# Patient Record
Sex: Female | Born: 1980 | Race: Black or African American | Hispanic: No | Marital: Single | Smoking: Never smoker
Health system: Southern US, Community
[De-identification: ages and names within clinical notes are randomized; demographics above are authoritative.]

## PROBLEM LIST (undated history)

## (undated) DIAGNOSIS — R05 Cough: Secondary | ICD-10-CM

## (undated) DIAGNOSIS — R011 Cardiac murmur, unspecified: Secondary | ICD-10-CM

## (undated) DIAGNOSIS — I2699 Other pulmonary embolism without acute cor pulmonale: Secondary | ICD-10-CM

## (undated) DIAGNOSIS — D649 Anemia, unspecified: Secondary | ICD-10-CM

## (undated) DIAGNOSIS — L309 Dermatitis, unspecified: Secondary | ICD-10-CM

## (undated) DIAGNOSIS — M1990 Unspecified osteoarthritis, unspecified site: Secondary | ICD-10-CM

## (undated) DIAGNOSIS — I10 Essential (primary) hypertension: Secondary | ICD-10-CM

## (undated) DIAGNOSIS — I739 Peripheral vascular disease, unspecified: Secondary | ICD-10-CM

## (undated) DIAGNOSIS — F419 Anxiety disorder, unspecified: Secondary | ICD-10-CM

## (undated) DIAGNOSIS — K219 Gastro-esophageal reflux disease without esophagitis: Secondary | ICD-10-CM

## (undated) DIAGNOSIS — J189 Pneumonia, unspecified organism: Secondary | ICD-10-CM

## (undated) DIAGNOSIS — O039 Complete or unspecified spontaneous abortion without complication: Secondary | ICD-10-CM

## (undated) DIAGNOSIS — I82409 Acute embolism and thrombosis of unspecified deep veins of unspecified lower extremity: Secondary | ICD-10-CM

## (undated) DIAGNOSIS — J45909 Unspecified asthma, uncomplicated: Secondary | ICD-10-CM

## (undated) HISTORY — DX: Cough: R05

## (undated) HISTORY — PX: MYOMECTOMY: SHX85

## (undated) HISTORY — DX: Dermatitis, unspecified: L30.9

## (undated) HISTORY — DX: Pneumonia, unspecified organism: J18.9

## (undated) HISTORY — DX: Other pulmonary embolism without acute cor pulmonale: I26.99

## (undated) HISTORY — DX: Acute embolism and thrombosis of unspecified deep veins of unspecified lower extremity: I82.409

## (undated) HISTORY — DX: Complete or unspecified spontaneous abortion without complication: O03.9

## (undated) HISTORY — DX: Essential (primary) hypertension: I10

---

## 2004-02-24 ENCOUNTER — Other Ambulatory Visit: Admission: RE | Admit: 2004-02-24 | Discharge: 2004-02-24 | Payer: Self-pay | Admitting: Obstetrics and Gynecology

## 2011-06-27 DIAGNOSIS — O039 Complete or unspecified spontaneous abortion without complication: Secondary | ICD-10-CM

## 2011-06-27 HISTORY — DX: Complete or unspecified spontaneous abortion without complication: O03.9

## 2012-06-26 DIAGNOSIS — I739 Peripheral vascular disease, unspecified: Secondary | ICD-10-CM

## 2012-06-26 HISTORY — DX: Peripheral vascular disease, unspecified: I73.9

## 2012-06-26 HISTORY — PX: IVC FILTER PLACEMENT (ARMC HX): HXRAD1551

## 2015-11-02 ENCOUNTER — Other Ambulatory Visit: Payer: Self-pay | Admitting: Gastroenterology

## 2015-11-02 DIAGNOSIS — R110 Nausea: Secondary | ICD-10-CM

## 2015-11-02 DIAGNOSIS — R1084 Generalized abdominal pain: Secondary | ICD-10-CM

## 2015-11-15 ENCOUNTER — Encounter (HOSPITAL_COMMUNITY)
Admission: RE | Admit: 2015-11-15 | Discharge: 2015-11-15 | Disposition: A | Discharge status: Home / Self Care | Payer: BLUE CROSS/BLUE SHIELD | Source: Ambulatory Visit | Attending: Gastroenterology | Admitting: Gastroenterology

## 2015-11-15 ENCOUNTER — Ambulatory Visit (HOSPITAL_COMMUNITY)
Admission: RE | Admit: 2015-11-15 | Discharge: 2015-11-15 | Disposition: A | Discharge status: Home / Self Care | Payer: BLUE CROSS/BLUE SHIELD | Source: Ambulatory Visit | Attending: Gastroenterology | Admitting: Gastroenterology

## 2015-11-15 DIAGNOSIS — R109 Unspecified abdominal pain: Secondary | ICD-10-CM | POA: Insufficient documentation

## 2015-11-15 DIAGNOSIS — R110 Nausea: Secondary | ICD-10-CM | POA: Insufficient documentation

## 2015-11-15 DIAGNOSIS — R1084 Generalized abdominal pain: Secondary | ICD-10-CM

## 2015-11-15 MED ORDER — TECHNETIUM TC 99M MEBROFENIN IV KIT
5.1300 | PACK | Freq: Once | INTRAVENOUS | Status: AC | PRN
  Administered 2015-11-15: 5.13 via INTRAVENOUS

## 2015-12-30 ENCOUNTER — Ambulatory Visit (INDEPENDENT_AMBULATORY_CARE_PROVIDER_SITE_OTHER): Payer: BLUE CROSS/BLUE SHIELD | Admitting: Physician Assistant

## 2015-12-30 VITALS — BP 151/101 | HR 83 | Temp 98.2°F | Resp 18 | Ht 68.0 in | Wt 271.0 lb

## 2015-12-30 DIAGNOSIS — I10 Essential (primary) hypertension: Secondary | ICD-10-CM | POA: Diagnosis not present

## 2015-12-30 DIAGNOSIS — D649 Anemia, unspecified: Secondary | ICD-10-CM | POA: Insufficient documentation

## 2015-12-30 DIAGNOSIS — Z862 Personal history of diseases of the blood and blood-forming organs and certain disorders involving the immune mechanism: Secondary | ICD-10-CM | POA: Diagnosis not present

## 2015-12-30 LAB — POCT URINALYSIS DIP (MANUAL ENTRY)
BILIRUBIN UA: NEGATIVE
Glucose, UA: NEGATIVE
Ketones, POC UA: NEGATIVE
Leukocytes, UA: NEGATIVE
NITRITE UA: NEGATIVE
PH UA: 5.5
PROTEIN UA: NEGATIVE
RBC UA: NEGATIVE
Spec Grav, UA: >=1.03
UROBILINOGEN UA: 0.2

## 2015-12-30 LAB — CBC
HEMATOCRIT: 36.6 % (ref 35.0–45.0)
HEMOGLOBIN: 11.8 g/dL (ref 11.7–15.5)
MCH: 28.4 pg (ref 27.0–33.0)
MCHC: 32.2 g/dL (ref 32.0–36.0)
MCV: 88 fL (ref 80.0–100.0)
MPV: 11.2 fL (ref 7.5–12.5)
Platelets: 268 10*3/uL (ref 140–400)
RBC: 4.16 MIL/uL (ref 3.80–5.10)
RDW: 14.7 % (ref 11.0–15.0)
WBC: 6.7 10*3/uL (ref 3.8–10.8)

## 2015-12-30 MED ORDER — AMLODIPINE BESYLATE 10 MG PO TABS: 10.0000 mg | ORAL_TABLET | Freq: Every day | ORAL | Status: DC

## 2015-12-30 MED ORDER — BLOOD PRESSURE CUFF MISC: 1.0000 | Freq: Every day | Status: DC

## 2015-12-30 MED ORDER — CHLORTHALIDONE 25 MG PO TABS: 25.0000 mg | ORAL_TABLET | Freq: Every day | ORAL | Status: DC

## 2015-12-30 NOTE — Progress Notes (Signed)
MRN: [id number] DOB: 1980-12-12  Subjective:   Sheri Robinson is a 35 y.o. female presenting for follow up on Hypertension.   Was diagnosed with HTN in 2013 in [place] during pregnancy. Was on metoprolol succ 50mg . Pt reports miscarriage at 5 months and continuation of HTN management with metoprolol. Was controlled on medication until June 2016. Upon relocating to this area, pt sought out a new PCP in order to get refills of her meds. The PCP she was seeing in [facility] did not believe in prescribing medications for HTN so she did not refill them. Instead, she encouraged lifestyle modifications (i.e., losing weight, meditation, referred for evaulation of TSH, OSA). Pt states that she was checked in 2013 for OSA and thyroid issues and reports that she did not have either. Pt tried lifestyle modifications for a couple of months but was having worse headaches, nose bleeds, and episodic dizziness. Each time she would see a specialist they would tell her that her BP was elevated. Tried to get a new PCP but everywhere has been booked up until now.   Currently managed with no meds. Patient is not checking blood pressure at home, range is [id number] systolic and [id number] diastolic when she sees her speicialists. Reports lightheadedness, dizziness, chronic headache, nausea, abdominal pain (but pt has GI issues always), blurry vision at times without glasses.  Denies chest pain, shortness of breath, heart racing, palpitations,  hematuria, lower leg swelling. No smoking, No alcohol. Denies any other aggravating or relieving factors, no other questions or concerns.  Exercising 1-2 times per week at a mild intensity (walks up stairs to apartment, crunches, and lunges) Diet consists [place] based meats, vegetables, minimal fruits, and water.   Pt is aware that she needs to lose weight and is interested in participating in more exercise once her BP is under control.  Pt is not actively trying to get pregnant but is  also not preventing it.    [name] has a current medication list which includes the following prescription(s): metoprolol. Also is allergic to amoxicillin and penicillins.  Sheri Robinson  has a past medical history of Hypertension; DVT (deep venous thrombosis) ([place]); and Miscarriage. Also  has past surgical history that includes Myomectomy and IVC FILTER PLACEMENT ([facility] HX).  Objective:   Vitals: BP 151/101 mmHg  Pulse 83  Temp(Src) 98.2 F (36.8 C) (Oral)  Resp 18  Ht 5\' 8"  (1.727 m)  Wt 271 lb (122.925 kg)  BMI 41.22 kg/m2  SpO2 99%  LMP 12/03/2015  Physical Exam  Constitutional: She is oriented to person, place, and time. She appears well-developed and well-nourished.  HENT:  Head: Normocephalic and atraumatic.  Eyes: Conjunctivae are normal. Pupils are equal, round, and reactive to light. No scleral icterus.  Neck: Normal range of motion.  Cardiovascular: Normal rate, regular rhythm and normal heart sounds.   Pulmonary/Chest: Effort normal and breath sounds normal.  Abdominal: Soft. Bowel sounds are normal.  Neurological: She is alert and oriented to person, place, and time.  Skin: Skin is warm and dry.  Psychiatric: She has a normal mood and affect.  Vitals reviewed.   Assessment and Plan :  1. Essential hypertension -Pt instructed to check  BP at home after resting every couple of days for the next two weeks. Prescribed a blood pressure cuff, [facility] for BP cuff for them to correctly fit your arm. Start Norvasc first for four days then add the chlorthalidone. If pt starts to feel light headed or  blood pressure reading of <100/60, can cut the chlorthalidone in half.  If pt becomes pregnant, informed that gynecologist may change medication during pregnancy.  - CBC - COMPLETE METABOLIC PANEL WITH GFR - TSH - Lipid panel - POCT urinalysis dipstick - EKG 12-Lead showed NSR  - amLODipine (NORVASC) 10 MG tablet; Take 1 tablet (10 mg total) by mouth daily.  Dispense:  30 tablet; Refill: 0 - chlorthalidone (HYGROTON) 25 MG tablet; Take 1 tablet (25 mg total) by mouth daily.  Dispense: 30 tablet; Refill: 0 -Follow up with me in office in 2 weeks.  -Pt encouraged to continue lifestyle modifications and was given instructions on heart healthy diet  2. History of anemia - CBC   [name], PA-C  [facility] 12/30/2015 11:15 AM

## 2015-12-30 NOTE — Patient Instructions (Addendum)
[facility] for BP cuff for them to correctly fit your arm Address: [address] [place], [place] [address]  Phone: [phone]    Check your BP at home after resting every couple of days for the next two weeks. I will see you back in office in two weeks for reevaluation. Start taking blood pressure medications daily. Start Norvasc first for four days then add the chlorthalidone. If you start to feel light headed or your blood pressure reading of <100/60 you can cut the chlorthalidone in half.   If you become pregnant, your gynecologist may change your medication during pregnancy. If you have any questions please let us know.     Heart-Healthy Eating Plan Many factors influence your heart health, including eating and exercise habits. Heart (coronary) risk increases with abnormal blood fat (lipid) levels. Heart-healthy meal planning includes limiting unhealthy fats, increasing healthy fats, and making other small dietary changes. This includes maintaining a healthy body weight to help keep lipid levels within a normal range. WHAT IS MY PLAN?  Your health care provider recommends that you:  Get no more than _________% of the total calories in your daily diet from fat.  Limit your intake of saturated fat to less than _________% of your total calories each day.  Limit the amount of cholesterol in your diet to less than _________ mg per day. WHAT TYPES OF FAT SHOULD I CHOOSE?  Choose healthy fats more often. Choose monounsaturated and polyunsaturated fats, such as olive oil and canola oil, flaxseeds, walnuts, almonds, and seeds.  Eat more omega-3 fats. Good choices include salmon, mackerel, sardines, tuna, flaxseed oil, and ground flaxseeds. Aim to eat fish at least two times each week.  Limit saturated fats. Saturated fats are primarily found in animal products, such as meats, butter, and cream. Plant sources of saturated fats include palm oil, palm kernel oil, and coconut  oil.  Avoid foods with partially hydrogenated oils in them. These contain trans fats. Examples of foods that contain trans fats are stick margarine, some tub margarines, cookies, crackers, and other baked goods. WHAT GENERAL GUIDELINES DO I NEED TO FOLLOW?  Check food labels carefully to identify foods with trans fats or high amounts of saturated fat.  Fill one half of your plate with vegetables and green salads. Eat 4-5 servings of vegetables per day. A serving of vegetables equals 1 cup of raw leafy vegetables,  cup of raw or cooked cut-up vegetables, or  cup of vegetable juice.  Fill one fourth of your plate with whole grains. Look for the word "whole" as the first word in the ingredient list.  Fill one fourth of your plate with lean protein foods.  Eat 4-5 servings of fruit per day. A serving of fruit equals one medium whole fruit,  cup of dried fruit,  cup of fresh, frozen, or canned fruit, or  cup of 100% fruit juice.  Eat more foods that contain soluble fiber. Examples of foods that contain this type of fiber are apples, broccoli, carrots, beans, peas, and barley. Aim to get 20-30 g of fiber per day.  Eat more home-cooked food and less restaurant, buffet, and fast food.  Limit or avoid alcohol.  Limit foods that are high in starch and sugar.  Avoid fried foods.  Cook foods by using methods other than frying. Baking, boiling, grilling, and broiling are all great options. Other fat-reducing suggestions include:  Removing the skin from poultry.  Removing all visible fats from meats.  Skimming  the fat off of stews, soups, and gravies before serving them.  Steaming vegetables in water or broth.  Lose weight if you are overweight. Losing just 5-10% of your initial body weight can help your overall health and prevent diseases such as diabetes and heart disease.  Increase your consumption of nuts, legumes, and seeds to 4-5 servings per week. One serving of dried beans or  legumes equals  cup after being cooked, one serving of nuts equals 1 ounces, and one serving of seeds equals  ounce or 1 tablespoon.  You may need to monitor your salt (sodium) intake, especially if you have high blood pressure. Talk with your health care provider or dietitian to get more information about reducing sodium. WHAT FOODS CAN I EAT? Grains Breads, including [place], white, pita, wheat, raisin, rye, oatmeal, and [place]. Tortillas that are neither fried nor made with lard or trans fat. Low-fat rolls, including hotdog and hamburger buns and English muffins. Biscuits. Muffins. Waffles. Pancakes. Light popcorn. Whole-grain cereals. Flatbread. Melba toast. Pretzels. Breadsticks. Rusks. Low-fat snacks and crackers, including oyster, saltine, matzo, graham, animal, and rye. Rice and pasta, including brown rice and those that are made with whole wheat. Vegetables All vegetables. Fruits All fruits, but limit coconut. Meats and Other Protein Sources Lean, well-trimmed beef, veal, pork, and lamb. Chicken and [place] without skin. All fish and shellfish. Wild duck, rabbit, pheasant, and venison. Egg whites or low-cholesterol egg substitutes. Dried beans, peas, lentils, and tofu.Seeds and most nuts. Dairy Low-fat or nonfat cheeses, including ricotta, string, and mozzarella. Skim or 1% milk that is liquid, powdered, or evaporated. Buttermilk that is made with low-fat milk. Nonfat or low-fat yogurt. Beverages Mineral water. Diet carbonated beverages. Sweets and Desserts Sherbets and fruit ices. Honey, jam, marmalade, jelly, and syrups. Meringues and gelatins. Pure sugar candy, such as hard candy, jelly beans, gumdrops, mints, marshmallows, and small amounts of dark chocolate. [employer] Eat all sweets and desserts in moderation. Fats and Oils Nonhydrogenated (trans-free) margarines. Vegetable oils, including soybean, sesame, sunflower, olive, peanut, safflower, corn, canola, and cottonseed.  Salad dressings or mayonnaise that are made with a vegetable oil. Limit added fats and oils that you use for cooking, baking, salads, and as spreads. Other Cocoa powder. Coffee and tea. All seasonings and condiments. The items listed above may not be a complete list of recommended foods or beverages. Contact your dietitian for more options. WHAT FOODS ARE NOT RECOMMENDED? Grains Breads that are made with saturated or trans fats, oils, or whole milk. Croissants. Butter rolls. Cheese breads. Sweet rolls. Donuts. Buttered popcorn. Chow mein noodles. High-fat crackers, such as cheese or butter crackers. Meats and Other Protein Sources Fatty meats, such as hotdogs, short ribs, sausage, spareribs, bacon, ribeye roast or steak, and mutton. High-fat deli meats, such as salami and bologna. Caviar. Domestic duck and goose. Organ meats, such as kidney, liver, sweetbreads, brains, gizzard, chitterlings, and heart. Dairy Cream, sour cream, cream cheese, and creamed cottage cheese. Whole milk cheeses, including blue (bleu), Monterey Jack, [place], [place], American, [place], Swiss, [place], [place], and [place]. Whole or 2% milk that is liquid, evaporated, or condensed. Whole buttermilk. Cream sauce or high-fat cheese sauce. Yogurt that is made from whole milk. Beverages Regular sodas and drinks with added sugar. Sweets and Desserts Frosting. Pudding. Cookies. Cakes other than angel food cake. Candy that has milk chocolate or white chocolate, hydrogenated fat, butter, coconut, or unknown ingredients. Buttered syrups. Full-fat ice cream or ice cream drinks. Fats and Oils Gravy that  has suet, meat fat, or shortening. Cocoa butter, hydrogenated oils, palm oil, coconut oil, palm kernel oil. These can often be found in baked products, candy, fried foods, nondairy creamers, and whipped toppings. Solid fats and shortenings, including bacon fat, salt pork, lard, and butter. Nondairy cream substitutes, such as coffee  creamers and sour cream substitutes. Salad dressings that are made of unknown oils, cheese, or sour cream. The items listed above may not be a complete list of foods and beverages to avoid. Contact your dietitian for more information.   This information is not intended to replace advice given to you by your health care provider. Make sure you discuss any questions you have with your health care provider.   Document Released: 03/21/2008 Document Revised: 07/03/2014 Document Reviewed: 12/04/2013 Elsevier Interactive Patient Education 2016 [employer].   Hypertension Hypertension, commonly called high blood pressure, is when the force of blood pumping through your arteries is too strong. Your arteries are the blood vessels that carry blood from your heart throughout your body. A blood pressure reading consists of a higher number over a lower number, such as 110/72. The higher number (systolic) is the pressure inside your arteries when your heart pumps. The lower number (diastolic) is the pressure inside your arteries when your heart relaxes. Ideally you want your blood pressure below 120/80. Hypertension forces your heart to work harder to pump blood. Your arteries may become narrow or stiff. Having untreated or uncontrolled hypertension can cause heart attack, stroke, kidney disease, and other problems. RISK FACTORS Some risk factors for high blood pressure are controllable. Others are not.  Risk factors you cannot control include:   Race. You may be at higher risk if you are African American.  Age. Risk increases with age.  Gender. Men are at higher risk than women before age 52 years. After age 33, women are at higher risk than men. Risk factors you can control include:  Not getting enough exercise or physical activity.  Being overweight.  Getting too much fat, sugar, calories, or salt in your diet.  Drinking too much alcohol. SIGNS AND SYMPTOMS Hypertension does not usually cause  signs or symptoms. Extremely high blood pressure (hypertensive crisis) may cause headache, anxiety, shortness of breath, and nosebleed. DIAGNOSIS To check if you have hypertension, your health care provider will measure your blood pressure while you are seated, with your arm held at the level of your heart. It should be measured at least twice using the same arm. Certain conditions can cause a difference in blood pressure between your right and left arms. A blood pressure reading that is higher than normal on one occasion does not mean that you need treatment. If it is not clear whether you have high blood pressure, you may be asked to return on a different day to have your blood pressure checked again. Or, you may be asked to monitor your blood pressure at home for 1 or more weeks. TREATMENT Treating high blood pressure includes making lifestyle changes and possibly taking medicine. Living a healthy lifestyle can help lower high blood pressure. You may need to change some of your habits. Lifestyle changes may include:  Following the DASH diet. This diet is high in fruits, vegetables, and whole grains. It is low in salt, red meat, and added sugars.  Keep your sodium intake below 2,300 mg per day.  Getting at least 30-45 minutes of aerobic exercise at least 4 times per week.  Losing weight if necessary.  Not smoking.  Limiting alcoholic beverages.  Learning ways to reduce stress. Your health care provider may prescribe medicine if lifestyle changes are not enough to get your blood pressure under control, and if one of the following is true:  You are 38-11 years of age and your systolic blood pressure is above 140.  You are 75 years of age or older, and your systolic blood pressure is above 150.  Your diastolic blood pressure is above 90.  You have diabetes, and your systolic blood pressure is over [id number] or your diastolic blood pressure is over 90.  You have kidney disease and your blood  pressure is above 140/90.  You have heart disease and your blood pressure is above 140/90. Your personal target blood pressure may vary depending on your medical conditions, your age, and other factors. HOME CARE INSTRUCTIONS  Have your blood pressure rechecked as directed by your health care provider.   Take medicines only as directed by your health care provider. Follow the directions carefully. Blood pressure medicines must be taken as prescribed. The medicine does not work as well when you skip doses. Skipping doses also puts you at risk for problems.  Do not smoke.   Monitor your blood pressure at home as directed by your health care provider. SEEK MEDICAL CARE IF:   You think you are having a reaction to medicines taken.  You have recurrent headaches or feel dizzy.  You have swelling in your ankles.  You have trouble with your vision. SEEK IMMEDIATE MEDICAL CARE IF:  You develop a severe headache or confusion.  You have unusual weakness, numbness, or feel faint.  You have severe chest or abdominal pain.  You vomit repeatedly.  You have trouble breathing. MAKE SURE YOU:   Understand these instructions.  Will watch your condition.  Will get help right away if you are not doing well or get worse.   This information is not intended to replace advice given to you by your health care provider. Make sure you discuss any questions you have with your health care provider.   Document Released: 06/12/2005 Document Revised: 10/27/2014 Document Reviewed: 04/04/2013 Elsevier Interactive Patient Education 2016 [employer].     IF you received an x-ray today, you will receive an invoice from [facility]. Please contact [facility] at [phone] with questions or concerns regarding your invoice.   IF you received labwork today, you will receive an invoice from [employer]. Please contact [facility] at [phone] with questions  or concerns regarding your invoice.   Our billing staff will not be able to assist you with questions regarding bills from these companies.  You will be contacted with the lab results as soon as they are available. The fastest way to get your results is to activate your My Chart account. Instructions are located on the last page of this paperwork. If you have not heard from [place] regarding the results in 2 weeks, please contact this office.

## 2015-12-31 ENCOUNTER — Encounter: Payer: Self-pay | Admitting: Physician Assistant

## 2015-12-31 LAB — COMPLETE METABOLIC PANEL WITH GFR
ALBUMIN: 4 g/dL (ref 3.6–5.1)
ALK PHOS: 46 U/L (ref 33–115)
ALT: 10 U/L (ref 6–29)
AST: 13 U/L (ref 10–30)
BILIRUBIN TOTAL: 0.5 mg/dL (ref 0.2–1.2)
BUN: 9 mg/dL (ref 7–25)
CALCIUM: 8.9 mg/dL (ref 8.6–10.2)
CO2: 21 mmol/L (ref 20–31)
CREATININE: 0.75 mg/dL (ref 0.50–1.10)
Chloride: 105 mmol/L (ref 98–110)
GFR, Est African American: >89 mL/min (ref >=60)
GFR, Est Non African American: >89 mL/min (ref >=60)
Glucose, Bld: 95 mg/dL (ref 65–99)
Potassium: 4.2 mmol/L (ref 3.5–5.3)
Sodium: 140 mmol/L (ref 135–146)
TOTAL PROTEIN: 7.3 g/dL (ref 6.1–8.1)

## 2015-12-31 LAB — LIPID PANEL
Cholesterol: 177 mg/dL (ref 125–200)
HDL: 56 mg/dL (ref >=46)
LDL CALC: 100 mg/dL (ref <130)
Total CHOL/HDL Ratio: 3.2 Ratio (ref <=5.0)
Triglycerides: 103 mg/dL (ref <150)
VLDL: 21 mg/dL (ref <30)

## 2015-12-31 LAB — TSH: TSH: 1.61 m[IU]/L

## 2016-01-22 ENCOUNTER — Ambulatory Visit (INDEPENDENT_AMBULATORY_CARE_PROVIDER_SITE_OTHER): Payer: BLUE CROSS/BLUE SHIELD | Admitting: Physician Assistant

## 2016-01-22 VITALS — BP 126/84 | HR 97 | Temp 98.1°F | Resp 18 | Ht 68.0 in | Wt 264.6 lb

## 2016-01-22 DIAGNOSIS — I10 Essential (primary) hypertension: Secondary | ICD-10-CM

## 2016-01-22 DIAGNOSIS — J309 Allergic rhinitis, unspecified: Secondary | ICD-10-CM | POA: Diagnosis not present

## 2016-01-22 MED ORDER — AMLODIPINE BESYLATE 10 MG PO TABS: 10.0000 mg | ORAL_TABLET | Freq: Every day | ORAL | 3 refills | Status: DC

## 2016-01-22 MED ORDER — CETIRIZINE HCL 10 MG PO CAPS: 10.0000 mg | ORAL_CAPSULE | Freq: Every day | ORAL | 12 refills | Status: DC

## 2016-01-22 MED ORDER — TRIAMCINOLONE ACETONIDE 55 MCG/ACT NA AERO: 2.0000 | INHALATION_SPRAY | Freq: Every day | NASAL | 12 refills | Status: DC

## 2016-01-22 MED ORDER — CHLORTHALIDONE 25 MG PO TABS: 25.0000 mg | ORAL_TABLET | Freq: Every day | ORAL | 3 refills | Status: DC

## 2016-01-22 NOTE — Progress Notes (Signed)
MRN: [id number] DOB: 1980/11/08  Subjective:   Sheri Robinson is a 35 y.o. female presenting for follow up on Hypertension.   Currently managed with amlodipine and chlorithalidone. Patient is checking blood pressure at home, range is [id number] systolic.  Denies lightheadedness, dizziness, double vision, chest pain, shortness of breath, heart racing, palpitations, nausea, vomiting, abdominal pain, hematuria, lower leg swelling. No smoking, No alcohol. Denies any other aggravating or relieving factors.   Has lost 7 lbs in the past three weeks with diet (portion control). Eats mostly vegetables and fruits. Not exercising as much as she wants but did just invest in a treadmill.   One concern for pt today is fatigue. Notes that she assumed it was because of her uncontrolled high blood pressure but now that her bp is controlled she is wondering what else it could be due to. Notes that it began after moving here about one year ago. She feels tired throughout most of the day even though she reports sleeping about 8 hours a night. Pt works from home. Cannot think of anything that she has changed in the past year that would contribute to this. The only thing she notes that has changed since she moved here from the [place] is the severity of her allergies. Notes that they have been worse and has symptoms daily of runny nose, sneezing, congestion, sinus fullness, and itchy watery eyes. Is not currently taking any medication for allergy control.    Sheri Robinson has a current medication list which includes the following prescription(s): amlodipine, blood pressure cuff, chlorthalidone, cyanocobalamin, linaclotide, omeprazole, and metoprolol. Also is allergic to amoxicillin and penicillins.  Sheri Robinson  has a past medical history of DVT (deep venous thrombosis) ([place]); Hypertension; and Miscarriage. Also  has a past surgical history that includes Myomectomy and IVC FILTER PLACEMENT ([facility] HX).  Objective:   Vitals: BP 126/84    Pulse 97   Temp 98.1 F (36.7 C) (Oral)   Resp 18   Ht 5\' 8"  (1.727 m)   Wt 264 lb 9.6 oz (120 kg)   LMP 12/31/2015   SpO2 96%   BMI 40.23 kg/m   Physical Exam  Constitutional: She is oriented to person, place, and time. She appears well-developed and well-nourished.  HENT:  Head: Normocephalic and atraumatic.  Nose: Mucosal edema and rhinorrhea present.  Mouth/Throat: Posterior oropharyngeal erythema present.  Eyes: Conjunctivae are normal.  Neck: Normal range of motion.  Pulmonary/Chest: Effort normal.  Musculoskeletal:       Right lower leg: She exhibits no swelling.       Left lower leg: She exhibits no swelling.  Neurological: She is alert and oriented to person, place, and time.  Skin: Skin is warm and dry.  Psychiatric: She has a normal mood and affect.  Vitals reviewed.   No results found for this or any previous visit (from the past 24 hour(s)).  Assessment and Plan :   1. Essential hypertension -Controlled on current medication regimen, no side effects - amLODipine (NORVASC) 10 MG tablet; Take 1 tablet (10 mg total) by mouth daily.  Dispense: 30 tablet; Refill: 3 - chlorthalidone (HYGROTON) 25 MG tablet; Take 1 tablet (25 mg total) by mouth daily.  Dispense: 30 tablet; Refill: 3 -Follow up in three months   2. Allergic rhinitis, unspecified allergic rhinitis type -Uncontrolled, likely contributing to her fatigue, will treat daily over the next three months and see if this helps with fatigue and headache as other labs are normal at this  time - Cetirizine HCl 10 MG CAPS; Take 1 capsule (10 mg total) by mouth daily.  Dispense: 30 capsule; Refill: 12 - triamcinolone (NASACORT) 55 MCG/ACT AERO nasal inhaler; Place 2 sprays into the nose daily.  Dispense: 1 Inhaler; Refill: 12 -OTC eye drops with antihistamine     Sheri Delaine, PA-C  [facility] 01/22/2016 1:48 PM

## 2016-01-22 NOTE — Patient Instructions (Addendum)
  Go buy OTC eye drops with antihistamine in them and use daily for allergies Also, as the pharmacy where you can find the netipot in the store, can use when you have runny nose/congestion  In terms of fatigue, really try to use the nasicort, zyrtec, and eye drops daily to get control over your allergies and I recommend making sure your vitamin has iron in it.  Continue bp meds as prescribed. If you experience any worrisome side effects discussed, please call the office. Otherwise, I will see you back in three months for follow up.   Continue doing that awesome job of portion control!  I am so proud of you!!  Thank you for letting me participate in your health and well being.    IF you received an x-ray today, you will receive an invoice from [facility]. Please contact [facility] at [phone] with questions or concerns regarding your invoice.   IF you received labwork today, you will receive an invoice from [employer]. Please contact [facility] at [phone] with questions or concerns regarding your invoice.   Our billing staff will not be able to assist you with questions regarding bills from these companies.  You will be contacted with the lab results as soon as they are available. The fastest way to get your results is to activate your My Chart account. Instructions are located on the last page of this paperwork. If you have not heard from [place] regarding the results in 2 weeks, please contact this office.

## 2016-03-22 ENCOUNTER — Other Ambulatory Visit: Payer: Self-pay | Admitting: Physician Assistant

## 2016-03-22 ENCOUNTER — Encounter: Payer: Self-pay | Admitting: Physician Assistant

## 2016-03-22 ENCOUNTER — Ambulatory Visit (INDEPENDENT_AMBULATORY_CARE_PROVIDER_SITE_OTHER): Payer: BLUE CROSS/BLUE SHIELD | Admitting: Physician Assistant

## 2016-03-22 ENCOUNTER — Ambulatory Visit (HOSPITAL_BASED_OUTPATIENT_CLINIC_OR_DEPARTMENT_OTHER)
Admission: RE | Admit: 2016-03-22 | Discharge: 2016-03-22 | Disposition: A | Discharge status: Home / Self Care | Payer: BLUE CROSS/BLUE SHIELD | Source: Ambulatory Visit | Attending: Physician Assistant | Admitting: Physician Assistant

## 2016-03-22 ENCOUNTER — Emergency Department (HOSPITAL_BASED_OUTPATIENT_CLINIC_OR_DEPARTMENT_OTHER)
Admission: EM | Admit: 2016-03-22 | Discharge: 2016-03-22 | Discharge status: Absent without leave | Payer: BLUE CROSS/BLUE SHIELD

## 2016-03-22 VITALS — BP 118/72 | HR 95 | Temp 98.7°F | Resp 18 | Ht 68.0 in | Wt 258.0 lb

## 2016-03-22 DIAGNOSIS — R112 Nausea with vomiting, unspecified: Secondary | ICD-10-CM

## 2016-03-22 DIAGNOSIS — Z23 Encounter for immunization: Secondary | ICD-10-CM

## 2016-03-22 DIAGNOSIS — M79662 Pain in left lower leg: Secondary | ICD-10-CM | POA: Insufficient documentation

## 2016-03-22 DIAGNOSIS — R1011 Right upper quadrant pain: Secondary | ICD-10-CM | POA: Insufficient documentation

## 2016-03-22 DIAGNOSIS — K838 Other specified diseases of biliary tract: Secondary | ICD-10-CM | POA: Insufficient documentation

## 2016-03-22 DIAGNOSIS — K760 Fatty (change of) liver, not elsewhere classified: Secondary | ICD-10-CM | POA: Diagnosis not present

## 2016-03-22 MED ORDER — ONDANSETRON 4 MG PO TBDP: 4.0000 mg | ORAL_TABLET | Freq: Three times a day (TID) | ORAL | 0 refills | Status: DC | PRN

## 2016-03-22 NOTE — Progress Notes (Signed)
Sheri Robinson  MRN: [id number] DOB: 12-14-1980  Subjective:  Sheri Robinson is a 35 y.o. female seen in office today for a chief complaint of left lower leg and foot swelling x 3 days. Has associated pain, low back pain, and warmth in lower leg. Notes that the swelling and warmth have decreased today but were pretty significant yesterday. Her pain is not relieved by anything. She has tried ibuprofen, aleve, and icy hot with no relief. She denies any acute injury. She has been stepping over dog barracade for the past week but that is the only new  thing she has changed at her home. Denies numbness, tingling, loss of sensation, urinary/bowel incontinence, recent travel or immobilization, hormone therapy, history of malignancy, smoking, SOB, difficulty breathing, chest pain, and palpitations. 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 She does admit to sitting for most of the day due to her work.  Pt also wants to address RUQ adominal pain x 1 day. Has associated nausea and vomiting (x 3 episodes). Denies hematemesis, pain associated with food intake,chills and diaphoresis. She is concerned that her dog gave her worms because he recently was treated for worms last week. Pt states she has not changed her diet recently and has not eaten any "different" foods over the past week.    Review of Systems  Constitutional: Positive for fatigue and fever. Negative for chills and diaphoresis.  Respiratory: Negative for cough and shortness of breath.   Cardiovascular: Negative for chest pain and palpitations.  Gastrointestinal: Negative for abdominal distention and constipation.  Musculoskeletal: Negative for gait problem.  Neurological: Positive for weakness ( in left leg). Negative for numbness.   There are no active problems to display for this patient.   Current Outpatient Prescriptions on File Prior to Visit  Medication Sig Dispense Refill  . amLODipine (NORVASC) 10 MG tablet Take 1 tablet  (10 mg total) by mouth daily. 30 tablet 3  . Blood Pressure Monitoring (BLOOD PRESSURE CUFF) MISC 1 Device by Does not apply route daily. 1 each 0  . Cetirizine HCl 10 MG CAPS Take 1 capsule (10 mg total) by mouth daily. 30 capsule 12  . chlorthalidone (HYGROTON) 25 MG tablet Take 1 tablet (25 mg total) by mouth daily. 30 tablet 3  . cyanocobalamin 500 MCG tablet Take 500 mcg by mouth daily.    Marland Kitchen linaclotide (LINZESS) 145 MCG CAPS capsule Take 145 mcg by mouth daily before breakfast.    . omeprazole (PRILOSEC) 40 MG capsule Take 40 mg by mouth daily.    Marland Kitchen triamcinolone (NASACORT) 55 MCG/ACT AERO nasal inhaler Place 2 sprays into the nose daily. 1 Inhaler 12   No current facility-administered medications on file prior to visit.     Allergies  Allergen Reactions  . Amoxicillin   . Penicillins    Past Medical History:  Diagnosis Date  . DVT (deep venous thrombosis) ([place])   . Hypertension   . Miscarriage   . Pulmonary embolism (HCC)     Objective:  BP 118/72 (BP Location: Right Arm, Patient Position: Sitting, Cuff Size: Large)   Pulse 95   Temp 98.7 F (37.1 C) (Oral)   Resp 18   Ht 5\' 8"  (1.727 m)   Wt 258 lb (117 kg)   LMP 03/19/2016   SpO2 98%   BMI 39.23 kg/m   Physical Exam  Constitutional: She is oriented to person, place, and time and well-developed, well-nourished, and in no  distress.  HENT:  Head: Normocephalic and atraumatic.  Eyes: Conjunctivae are normal.  Neck: Normal range of motion.  Cardiovascular: Normal rate, regular rhythm, normal heart sounds and intact distal pulses.   Pulmonary/Chest: Effort normal and breath sounds normal.  Abdominal: Soft. Normal appearance and bowel sounds are normal. There is tenderness in the right upper quadrant.  Musculoskeletal:       Lumbar back: She exhibits tenderness (upon palpation of left latismus dorsi ). She exhibits no bony tenderness and no swelling.       Right lower leg: Normal.       Left lower leg: She exhibits  tenderness (upon palpation of calf). She exhibits no swelling.  Calf measures 46 cm bilateral. Ankle measures 26 cm bilateral.   Neurological: She is alert and oriented to person, place, and time. She has normal sensation and normal strength. Gait normal.  Reflex Scores:      Patellar reflexes are 2+ on the right side and 2+ on the left side.      Achilles reflexes are 2+ on the right side and 2+ on the left side. Skin: Skin is warm and dry.  Psychiatric: Affect normal.  Vitals reviewed.   Assessment and Plan :  This case was precepted with Dr. Reginia Forts  1. Need for prophylactic vaccination and inoculation against influenza - Flu Vaccine QUAD 36+ mos IM  2. Pain of left lower leg -Await results - US Venous Img Lower Unilateral Left -Xarelto starter pack if positive for DVT  3. Nausea and vomiting, intractability of vomiting not specified, unspecified vomiting type -Await results - COMPLETE METABOLIC PANEL WITH GFR - CBC with Differential/Platelet - ondansetron (ZOFRAN ODT) 4 MG disintegrating tablet; Take 1 tablet (4 mg total) by mouth every 8 (eight) hours as needed for nausea or vomiting.  Dispense: 20 tablet; Refill: 0 -BRAT diet, hydration with water  4. RUQ pain -Await results - US Abdomen Limited RUQ; Future  [name] PA-C  [facility] 03/22/2016 7:41 PM

## 2016-03-22 NOTE — Patient Instructions (Addendum)
  Go to [facility] to check in.   [address] [place], [place] [address]    We will contact you with abdominal US appointment.  Eat BRAT diet in the meantime (banana, rice, apple, toast) Keep hydrated with at least 64 oz of water.   IF you received an x-ray today, you will receive an invoice from [facility]. Please contact [facility] at [phone] with questions or concerns regarding your invoice.   IF you received labwork today, you will receive an invoice from [employer]. Please contact [facility] at [phone] with questions or concerns regarding your invoice.   Our billing staff will not be able to assist you with questions regarding bills from these companies.  You will be contacted with the lab results as soon as they are available. The fastest way to get your results is to activate your My Chart account. Instructions are located on the last page of this paperwork. If you have not heard from [place] regarding the results in 2 weeks, please contact this office.

## 2016-03-22 NOTE — Addendum Note (Signed)
Addended by: [name] D on: 03/22/2016 11:07 PM   Modules accepted: Orders

## 2016-03-22 NOTE — Progress Notes (Signed)
I was contacted by [place] tech with results. [place] of lower extremity showed no evidence of DVT. [place] of RUQ showed dilatation of the common bile duct to 0.9 cm, raising concern for distal obstruction. It was suggested to order MRCP. I will put in an order for MRCP and also add on a lipase to labs. Pt has been notified of results. Instructed that the imaging will hopefully be done tomorrow. She was instructed that if she she develop any severe abdominal pain, vomiting, fever or diaphoresis to go to ER immediately. Pt understands and agrees to treatment plan.

## 2016-03-23 ENCOUNTER — Encounter (HOSPITAL_COMMUNITY): Payer: Self-pay | Admitting: Emergency Medicine

## 2016-03-23 ENCOUNTER — Telehealth: Payer: Self-pay | Admitting: Emergency Medicine

## 2016-03-23 ENCOUNTER — Emergency Department (HOSPITAL_COMMUNITY)
Admission: EM | Admit: 2016-03-23 | Discharge: 2016-03-24 | Disposition: A | Discharge status: Home / Self Care | Payer: BLUE CROSS/BLUE SHIELD | Attending: Emergency Medicine | Admitting: Emergency Medicine

## 2016-03-23 DIAGNOSIS — I10 Essential (primary) hypertension: Secondary | ICD-10-CM | POA: Insufficient documentation

## 2016-03-23 DIAGNOSIS — E876 Hypokalemia: Secondary | ICD-10-CM | POA: Insufficient documentation

## 2016-03-23 LAB — CBC WITH DIFFERENTIAL/PLATELET
BASOS PCT: 0 %
Basophils Absolute: 0 cells/uL (ref 0–200)
Basophils Absolute: 0 10*3/uL (ref 0.0–0.1)
Basophils Relative: 0 %
EOS ABS: 0.1 10*3/uL (ref 0.0–0.7)
EOS PCT: 1 %
EOS PCT: 1 %
Eosinophils Absolute: 75 cells/uL (ref 15–500)
HCT: 32.8 % — ABNORMAL LOW (ref 35.0–45.0)
HCT: 32.6 % — ABNORMAL LOW (ref 36.0–46.0)
HEMOGLOBIN: 11.4 g/dL — AB (ref 11.7–15.5)
Hemoglobin: 10.7 g/dL — ABNORMAL LOW (ref 12.0–15.0)
LYMPHS ABS: 1875 {cells}/uL (ref 850–3900)
LYMPHS ABS: 1.7 10*3/uL (ref 0.7–4.0)
Lymphocytes Relative: 25 %
Lymphocytes Relative: 24 %
MCH: 29.5 pg (ref 27.0–33.0)
MCH: 29.2 pg (ref 26.0–34.0)
MCHC: 34.8 g/dL (ref 32.0–36.0)
MCHC: 32.8 g/dL (ref 30.0–36.0)
MCV: 85 fL (ref 80.0–100.0)
MCV: 89.1 fL (ref 78.0–100.0)
MONO ABS: 525 {cells}/uL (ref 200–950)
MONO ABS: 0.3 10*3/uL (ref 0.1–1.0)
MPV: 9.9 fL (ref 7.5–12.5)
Monocytes Relative: 7 %
Monocytes Relative: 5 %
NEUTROS PCT: 67 %
Neutro Abs: 5025 cells/uL (ref 1500–7800)
Neutro Abs: 4.9 10*3/uL (ref 1.7–7.7)
Neutrophils Relative %: 70 %
PLATELETS: 280 10*3/uL (ref 150–400)
Platelets: 305 10*3/uL (ref 140–400)
RBC: 3.86 MIL/uL (ref 3.80–5.10)
RBC: 3.66 MIL/uL — AB (ref 3.87–5.11)
RDW: 14.2 % (ref 11.0–15.0)
RDW: 14 % (ref 11.5–15.5)
WBC: 7.5 10*3/uL (ref 3.8–10.8)
WBC: 7 10*3/uL (ref 4.0–10.5)

## 2016-03-23 LAB — COMPLETE METABOLIC PANEL WITH GFR
ALBUMIN: 4.1 g/dL (ref 3.6–5.1)
ALK PHOS: 65 U/L (ref 33–115)
ALT: 45 U/L — ABNORMAL HIGH (ref 6–29)
AST: 75 U/L — ABNORMAL HIGH (ref 10–30)
BUN: 10 mg/dL (ref 7–25)
CALCIUM: 9.1 mg/dL (ref 8.6–10.2)
CHLORIDE: 97 mmol/L — AB (ref 98–110)
CO2: 28 mmol/L (ref 20–31)
Creat: 1.04 mg/dL (ref 0.50–1.10)
GFR, EST AFRICAN AMERICAN: 79 mL/min (ref >=60)
GFR, EST NON AFRICAN AMERICAN: 69 mL/min (ref >=60)
Glucose, Bld: 89 mg/dL (ref 65–99)
POTASSIUM: 2.8 mmol/L — AB (ref 3.5–5.3)
Sodium: 138 mmol/L (ref 135–146)
Total Bilirubin: 0.7 mg/dL (ref 0.2–1.2)
Total Protein: 7.6 g/dL (ref 6.1–8.1)

## 2016-03-23 LAB — COMPREHENSIVE METABOLIC PANEL
ALT: 123 U/L — ABNORMAL HIGH (ref 14–54)
ANION GAP: 11 (ref 5–15)
AST: 129 U/L — ABNORMAL HIGH (ref 15–41)
Albumin: 3.9 g/dL (ref 3.5–5.0)
Alkaline Phosphatase: 73 U/L (ref 38–126)
BUN: 8 mg/dL (ref 6–20)
CHLORIDE: 96 mmol/L — AB (ref 101–111)
CO2: 29 mmol/L (ref 22–32)
Calcium: 8.9 mg/dL (ref 8.9–10.3)
Creatinine, Ser: 1.41 mg/dL — ABNORMAL HIGH (ref 0.44–1.00)
GFR, EST AFRICAN AMERICAN: 54 mL/min — AB (ref >60)
GFR, EST NON AFRICAN AMERICAN: 47 mL/min — AB (ref >60)
Glucose, Bld: 115 mg/dL — ABNORMAL HIGH (ref 65–99)
POTASSIUM: 2.3 mmol/L — AB (ref 3.5–5.1)
SODIUM: 136 mmol/L (ref 135–145)
Total Bilirubin: 0.8 mg/dL (ref 0.3–1.2)
Total Protein: 8 g/dL (ref 6.5–8.1)

## 2016-03-23 LAB — POC URINE PREG, ED: PREG TEST UR: NEGATIVE

## 2016-03-23 NOTE — ED Notes (Signed)
Potassium 2.3 - reported to Dr Gilford Raid

## 2016-03-23 NOTE — Telephone Encounter (Signed)
Pt was contacted with lab results, informed of critical potassium level of 2.8. I strongly recommended that she go immediately to [facility] ER for further evaluation, potassium replacement. Spoke with triage nurse who verbalized understanding. Patient agreed to report to [facility] ED tonight.

## 2016-03-23 NOTE — Telephone Encounter (Signed)
Pt given scheduled MRCP appointment at [facility] hospital 03/24/16 @ 1pm. Instructions given to arrive at 1245pm and NPO x4 hrs prior to scan.  Verbalized understanding.

## 2016-03-23 NOTE — ED Triage Notes (Signed)
Pt. received a call from her PCP reports low pottasium level from blood tests yesterday , denies any pain or discomfort , respirations unlabored .

## 2016-03-23 NOTE — Addendum Note (Signed)
Addended by: [name] D on: 03/23/2016 12:16 PM   Modules accepted: Orders

## 2016-03-24 ENCOUNTER — Other Ambulatory Visit (INDEPENDENT_AMBULATORY_CARE_PROVIDER_SITE_OTHER): Payer: BLUE CROSS/BLUE SHIELD | Admitting: Urgent Care

## 2016-03-24 ENCOUNTER — Other Ambulatory Visit: Payer: Self-pay | Admitting: Urgent Care

## 2016-03-24 ENCOUNTER — Telehealth: Payer: Self-pay | Admitting: Emergency Medicine

## 2016-03-24 ENCOUNTER — Ambulatory Visit (HOSPITAL_COMMUNITY): Admission: RE | Admit: 2016-03-24 | Payer: BLUE CROSS/BLUE SHIELD | Source: Ambulatory Visit

## 2016-03-24 DIAGNOSIS — E876 Hypokalemia: Secondary | ICD-10-CM

## 2016-03-24 DIAGNOSIS — R1011 Right upper quadrant pain: Secondary | ICD-10-CM | POA: Diagnosis not present

## 2016-03-24 LAB — BASIC METABOLIC PANEL
BUN: 8 mg/dL (ref 7–25)
CALCIUM: 8.9 mg/dL (ref 8.6–10.2)
CHLORIDE: 99 mmol/L (ref 98–110)
CO2: 30 mmol/L (ref 20–31)
CREATININE: 1.07 mg/dL (ref 0.50–1.10)
GLUCOSE: 96 mg/dL (ref 65–99)
Potassium: 3.1 mmol/L — ABNORMAL LOW (ref 3.5–5.3)
Sodium: 139 mmol/L (ref 135–146)

## 2016-03-24 LAB — URINE MICROSCOPIC-ADD ON: WBC, UA: NONE SEEN WBC/hpf (ref 0–5)

## 2016-03-24 LAB — URINALYSIS, ROUTINE W REFLEX MICROSCOPIC
GLUCOSE, UA: NEGATIVE mg/dL
Ketones, ur: NEGATIVE mg/dL
LEUKOCYTES UA: NEGATIVE
Nitrite: NEGATIVE
PROTEIN: NEGATIVE mg/dL
SPECIFIC GRAVITY, URINE: 1.018 (ref 1.005–1.030)
pH: 5.5 (ref 5.0–8.0)

## 2016-03-24 LAB — LIPASE: Lipase: 16 U/L (ref 7–60)

## 2016-03-24 LAB — POTASSIUM: POTASSIUM: 3 mmol/L — AB (ref 3.5–5.1)

## 2016-03-24 MED ORDER — POTASSIUM CHLORIDE 10 MEQ/100ML IV SOLN
10.0000 meq | Freq: Once | INTRAVENOUS | Status: AC
  Administered 2016-03-24: 10 meq via INTRAVENOUS
  Filled 2016-03-24: qty 100

## 2016-03-24 MED ORDER — POTASSIUM CHLORIDE 10 MEQ/100ML IV SOLN: 10.0000 meq | Freq: Once | INTRAVENOUS | Status: DC

## 2016-03-24 MED ORDER — POTASSIUM CHLORIDE 10 MEQ/100ML IV SOLN
10.0000 meq | Freq: Once | INTRAVENOUS | Status: AC
  Administered 2016-03-24: 10 meq via INTRAVENOUS

## 2016-03-24 MED ORDER — POTASSIUM CHLORIDE ER 10 MEQ PO TBCR: 10.0000 meq | EXTENDED_RELEASE_TABLET | Freq: Every day | ORAL | 0 refills | Status: DC

## 2016-03-24 MED ORDER — POTASSIUM CHLORIDE CRYS ER 20 MEQ PO TBCR: 20.0000 meq | EXTENDED_RELEASE_TABLET | Freq: Two times a day (BID) | ORAL | 0 refills | Status: DC

## 2016-03-24 MED ORDER — POTASSIUM CHLORIDE CRYS ER 20 MEQ PO TBCR
80.0000 meq | EXTENDED_RELEASE_TABLET | Freq: Once | ORAL | Status: AC
  Administered 2016-03-24: 80 meq via ORAL
  Filled 2016-03-24: qty 4

## 2016-03-24 NOTE — ED Provider Notes (Signed)
[facility] DEPT Provider Note   CSN: [id number] Arrival date & time: 03/23/16  2207  History   Chief Complaint Chief Complaint  Patient presents with  . Hypokalemia    HPI Sheri Robinson is a 35 y.o. female.  HPI   Patient to the ER with PMH of DVT, hypertension, miscarriage, PE.  The patient had blood work done from her PCP yesterday. She was called and told to come to the ER because her potassium levels are really low. She went to her PCP yesterday because she was having GI upset. She was sent to the ER for imaging and was told she was having problems with her gallbladder. She is due back at [facility] on 9/29 for a HIDA scan at 12:45 pm, patient says she is completely asymptomatic now and feels 100% back to baseline.  She also had a scan to evaluate for possible DVT or PE yesterday as well due to hx of blood clots, she reports these tests were negative.  Past Medical History:  Diagnosis Date  . DVT (deep venous thrombosis) ([place])   . Hypertension   . Miscarriage   . Pulmonary embolism (HCC)     There are no active problems to display for this patient.   Past Surgical History:  Procedure Laterality Date  . IVC FILTER PLACEMENT ([facility] HX)    . MYOMECTOMY      OB History    No data available       Home Medications    Prior to Admission medications   Medication Sig Start Date End Date Taking? Authorizing Provider  amLODipine (NORVASC) 10 MG tablet Take 1 tablet (10 mg total) by mouth daily. 01/22/16   Leonie Douglas, PA-C  Blood Pressure Monitoring (BLOOD PRESSURE CUFF) MISC 1 Device by Does not apply route daily. 12/30/15   Leonie Douglas, PA-C  Cetirizine HCl 10 MG CAPS Take 1 capsule (10 mg total) by mouth daily. 01/22/16   Leonie Douglas, PA-C  chlorthalidone (HYGROTON) 25 MG tablet Take 1 tablet (25 mg total) by mouth daily. 01/22/16   Leonie Douglas, PA-C  cyanocobalamin 500 MCG tablet Take 500 mcg by mouth daily.    Historical Provider, MD    linaclotide (LINZESS) 145 MCG CAPS capsule Take 145 mcg by mouth daily before breakfast.    Historical Provider, MD  omeprazole (PRILOSEC) 40 MG capsule Take 40 mg by mouth daily.    Historical Provider, MD  ondansetron (ZOFRAN ODT) 4 MG disintegrating tablet Take 1 tablet (4 mg total) by mouth every 8 (eight) hours as needed for nausea or vomiting. 03/22/16   Leonie Douglas, PA-C  potassium chloride (K-DUR) 10 MEQ tablet Take 1 tablet (10 mEq total) by mouth daily. 03/24/16   Delos Haring, PA-C  triamcinolone (NASACORT) 55 MCG/ACT AERO nasal inhaler Place 2 sprays into the nose daily. 01/22/16   Leonie Douglas, PA-C    Family History Family History  Problem Relation Age of Onset  . Hypertension Mother   . Diabetes Mother   . Stroke Father     Social History Social History  Substance Use Topics  . Smoking status: Never Smoker  . Smokeless tobacco: Never Used  . Alcohol use No     Allergies   Amoxicillin and Penicillins   Review of Systems Review of Systems Review of Systems All other systems negative except as documented in the HPI. All pertinent positives and negatives as reviewed in the HPI.   Physical Exam Updated Vital  Signs BP 112/72   Pulse 82   Temp 97.5 F (36.4 C) (Oral)   Resp 17   LMP 03/19/2016   SpO2 99%   Physical Exam  Constitutional: She appears well-developed and well-nourished. No distress.  HENT:  Head: Normocephalic and atraumatic.  Right Ear: Tympanic membrane and ear canal normal.  Left Ear: Tympanic membrane and ear canal normal.  Nose: Nose normal.  Mouth/Throat: Uvula is midline, oropharynx is clear and moist and mucous membranes are normal.  Eyes: Pupils are equal, round, and reactive to light.  Neck: Normal range of motion. Neck supple.  Cardiovascular: Normal rate and regular rhythm.   Pulmonary/Chest: Effort normal.  Abdominal: Soft. Bowel sounds are normal.  No signs of abdominal distention  Musculoskeletal:  No LE  swelling  Neurological: She is alert.  Acting at baseline  Skin: Skin is warm and dry. No rash noted.  Nursing note and vitals reviewed.    ED Treatments / Results  Labs (all labs ordered are listed, but only abnormal results are displayed) Labs Reviewed  CBC WITH DIFFERENTIAL/PLATELET - Abnormal; Notable for the following:       Result Value   RBC 3.66 (*)    Hemoglobin 10.7 (*)    HCT 32.6 (*)    All other components within normal limits  COMPREHENSIVE METABOLIC PANEL - Abnormal; Notable for the following:    Potassium 2.3 (*)    Chloride 96 (*)    Glucose, Bld 115 (*)    Creatinine, Ser 1.41 (*)    AST 129 (*)    ALT 123 (*)    GFR calc non Af Amer 47 (*)    GFR calc Af Amer 54 (*)    All other components within normal limits  URINALYSIS, ROUTINE W REFLEX MICROSCOPIC (NOT AT [facility]) - Abnormal; Notable for the following:    Color, Urine AMBER (*)    APPearance CLOUDY (*)    Hgb urine dipstick LARGE (*)    Bilirubin Urine SMALL (*)    All other components within normal limits  URINE MICROSCOPIC-ADD ON - Abnormal; Notable for the following:    Squamous Epithelial / LPF 0-5 (*)    Bacteria, UA RARE (*)    Casts HYALINE CASTS (*)    All other components within normal limits  POTASSIUM - Abnormal; Notable for the following:    Potassium 3.0 (*)    All other components within normal limits  POC URINE PREG, ED    EKG  EKG Interpretation None       Radiology US Venous Img Lower Unilateral Left  Result Date: 03/22/2016 CLINICAL DATA:  Acute onset of left lateral thigh pain. Personal history of deep venous thrombosis. Initial encounter. EXAM: LEFT LOWER EXTREMITY VENOUS DOPPLER ULTRASOUND TECHNIQUE: Gray-scale sonography with graded compression, as well as color Doppler and duplex ultrasound were performed to evaluate the lower extremity deep venous systems from the level of the common femoral vein and including the common femoral, femoral, profunda femoral, popliteal  and calf veins including the posterior tibial, peroneal and gastrocnemius veins when visible. The superficial great saphenous vein was also interrogated. Spectral Doppler was utilized to evaluate flow at rest and with distal augmentation maneuvers in the common femoral, femoral and popliteal veins. COMPARISON:  None. FINDINGS: Contralateral Common Femoral Vein: Respiratory phasicity is normal and symmetric with the symptomatic side. No evidence of thrombus. Normal compressibility. Common Femoral Vein: No evidence of thrombus. Normal compressibility, respiratory phasicity and response to augmentation. Saphenofemoral Junction: No  evidence of thrombus. Normal compressibility and flow on color Doppler imaging. Profunda Femoral Vein: No evidence of thrombus. Normal compressibility and flow on color Doppler imaging. Femoral Vein: No evidence of thrombus. Normal compressibility, respiratory phasicity and response to augmentation. Popliteal Vein: No evidence of thrombus. Normal compressibility, respiratory phasicity and response to augmentation. Calf Veins: No evidence of thrombus. Normal compressibility and flow on color Doppler imaging. The peroneal vein is not visualized. Superficial Great Saphenous Vein: No evidence of thrombus. Normal compressibility and flow on color Doppler imaging. Venous Reflux:  None. Other Findings: No focal abnormality is noted at the left lateral thigh, at the patient's site of symptoms. IMPRESSION: No evidence of deep venous thrombosis. Electronically Signed   By: Garald Balding M.D.   On: 03/22/2016 20:39   US Abdomen Limited Ruq  Result Date: 03/22/2016 CLINICAL DATA:  Acute onset of right upper quadrant abdominal pain, nausea and vomiting. Initial encounter. EXAM: US ABDOMEN LIMITED - RIGHT UPPER QUADRANT COMPARISON:  Abdominal ultrasound performed 11/15/2015 FINDINGS: Gallbladder: There is question of tiny layering stones within the gallbladder. No gallbladder wall thickening or  pericholecystic fluid is seen. No ultrasonographic Murphy's sign is elicited. Common bile duct: Diameter: 0.9 cm, raising concern for distal obstruction. Liver: No focal lesion identified. Mildly increased parenchymal echogenicity likely reflects fatty infiltration. IMPRESSION: 1. Dilatation of the common bile duct to 0.9 cm, raising concern for distal obstruction. Would correlate with LFTs, and consider MRCP or ERCP for further evaluation, as deemed clinically appropriate. 2. Question of tiny layering stones within the gallbladder. No evidence of cholecystitis. 3. Mild fatty infiltration within the liver. Electronically Signed   By: Garald Balding M.D.   On: 03/22/2016 20:43    Procedures Procedures (including critical care time)  Medications Ordered in ED Medications  potassium chloride SA (K-DUR,KLOR-CON) CR tablet 80 mEq (80 mEq Oral Given 03/24/16 0136)  potassium chloride 10 mEq in 100 mL IVPB (0 mEq Intravenous Stopped 03/24/16 0440)  potassium chloride 10 mEq in 100 mL IVPB (0 mEq Intravenous Stopped 03/24/16 0230)     Initial Impression / Assessment and Plan / ED Course  I have reviewed the triage vital signs and the nursing notes.  Pertinent labs & imaging results that were available during my care of the patient were reviewed by me and considered in my medical decision making (see chart for details).  Clinical Course    1:22 am - potassium ordered, will let potassium run and re-evaluate after completion by rechecking K. 4:40 am - potassium has improved to 3.0. Will discharge at this time and place on oral potassium PO at home.  Pt to return at 12:45 pm for HIDA scan at [facility]. Currently asymptomatic.   Final Clinical Impressions(s) / ED Diagnoses   Final diagnoses:  Hypokalemia    New Prescriptions New Prescriptions   POTASSIUM CHLORIDE (K-DUR) 10 MEQ TABLET    Take 1 tablet (10 mEq total) by mouth daily.     Delos Haring, PA-C 03/24/16 [id number]    Charlesetta Shanks,  MD 03/30/16 1537

## 2016-03-24 NOTE — Telephone Encounter (Addendum)
Case precepted with Dr. Corliss Parish.  I discussed case with PA-Wiseman at length. Patient has had ~6 month history of intermittent abdominal pain, worst in RUQ. She has been seen by her gynecologist, Dr. Charlesetta Garibaldi, followed by a referral to GI, Dr. Mar Daring. She has been worked up for her abdominal pain, bloating, cramping and constipation with normal testing including cmet, cbc, abdominal U/S and HIDA scan. This imaging was performed on 11/15/2015. Patient's abdominal pain persisted thereafter and was intermittent. She was subsequently seen at our clinic by PA-Brittany on 03/22/2016 for abdominal pain, n/v, diarrhea and concern of DVT in setting of history of DVT, PE in 2013. Doppler U/S was negative. RUQ U/S showed dilatation of common bile duct at 0.9cm with recommendations to obtain ERCP, MRCP. This order was placed by PA-Brittany. On 03/23/2016, labs showed critical K+ level of 2.8, elevated AST, ALT at 75 and 45, respectively. She had normal alk phos, bilirubin. However, given patient's symptom set and hypokalemia, she was advised to report to the [facility] ED, her potassium level on recheck at the ED had dropped to 2.3, had unclear etiology. She then had K+ replacement with 14mq PO, total of 258m IV, recheck showed K+ of 3.0, was discharged and advised to f/u with PA-Brittany today, 03/24/2016. Today, I spoke with patient and she reported that she no longer had abdominal pain since yesterday or n/v. I also obtained authorization for the [facility] discussed her case with Dr. KiKieth Brightlynd his recommendation was to cancel her MRCP, have her referred urgently to his practice, [facility] for management of symptomatic gall bladder disease. We decided this based on imaging results that showed no signs of cholecystitis, tiny gallstones without evidence of obstruction, her latest K+ level. She is scheduled to see Dr. ToMarlou Starksn 03/27/2016 at  08:00. Patient will take 202mof K+ twice daily over the weekend. She will rtc today for labs only, recheck of her Bmet today. She is scheduled to see me tomorrow 03/25/2016, 10:00. I reviewed signs and symptoms of hypokalemia including chest pain, heart racing, palpitations, n/v, dizziness. I also discussed worsening symptoms of gall bladder disease including return of her abdominal pain, fever, jaundice. She is to report to [facility] if these symptoms develop. Otherwise, she will keep her appointment [facility].   MarJaynee EaglesA-C [facility] [phone]  12:09 PM

## 2016-03-24 NOTE — Telephone Encounter (Signed)
Pending PA for scheduled MRCP approved through [employer] code [id number] valid through Oct-28th.

## 2016-03-25 ENCOUNTER — Ambulatory Visit (INDEPENDENT_AMBULATORY_CARE_PROVIDER_SITE_OTHER): Payer: BLUE CROSS/BLUE SHIELD | Admitting: Urgent Care

## 2016-03-25 VITALS — BP 122/84 | HR 81 | Temp 98.3°F | Resp 16 | Ht 68.0 in | Wt 260.0 lb

## 2016-03-25 DIAGNOSIS — R1011 Right upper quadrant pain: Secondary | ICD-10-CM | POA: Diagnosis not present

## 2016-03-25 DIAGNOSIS — K829 Disease of gallbladder, unspecified: Secondary | ICD-10-CM | POA: Diagnosis not present

## 2016-03-25 DIAGNOSIS — D649 Anemia, unspecified: Secondary | ICD-10-CM

## 2016-03-25 DIAGNOSIS — K838 Other specified diseases of biliary tract: Secondary | ICD-10-CM | POA: Diagnosis not present

## 2016-03-25 DIAGNOSIS — E876 Hypokalemia: Secondary | ICD-10-CM

## 2016-03-25 LAB — POTASSIUM: POTASSIUM: 3.3 mmol/L — AB (ref 3.5–5.3)

## 2016-03-25 LAB — POCT CBC
Granulocyte percent: 66.7 %G (ref 37–80)
HCT, POC: 30.2 % — AB (ref 37.7–47.9)
HEMOGLOBIN: 10.7 g/dL — AB (ref 12.2–16.2)
LYMPH, POC: 1.5 (ref 0.6–3.4)
MCH: 29.6 pg (ref 27–31.2)
MCHC: 35.4 g/dL (ref 31.8–35.4)
MCV: 83.7 fL (ref 80–97)
MID (cbc): 0.4 (ref 0–0.9)
MPV: 7.6 fL (ref 0–99.8)
PLATELET COUNT, POC: 271 10*3/uL (ref 142–424)
POC GRANULOCYTE: 3.9 (ref 2–6.9)
POC LYMPH %: 25.8 % (ref 10–50)
POC MID %: 7.5 %M (ref 0–12)
RBC: 3.61 M/uL — AB (ref 4.04–5.48)
RDW, POC: 14.7 %
WBC: 5.9 10*3/uL (ref 4.6–10.2)

## 2016-03-25 LAB — FERRITIN: FERRITIN: 67 ng/mL (ref 10–154)

## 2016-03-25 NOTE — Patient Instructions (Addendum)
If you develop chest pain, heart racing, palpitations report to [facility] ED as your potassium levels might have worsened.  If your belly pain returns, you have n/v, fever, yellowing of your skin, please go to [facility] ED to seek immediate treatment for your gallbladder disease.    IF you received an x-ray today, you will receive an invoice from [facility] Radiology. Please contact [facility] at [phone] with questions or concerns regarding your invoice.   IF you received labwork today, you will receive an invoice from [employer]. Please contact [facility] at [phone] with questions or concerns regarding your invoice.   Our billing staff will not be able to assist you with questions regarding bills from these companies.  You will be contacted with the lab results as soon as they are available. The fastest way to get your results is to activate your My Chart account. Instructions are located on the last page of this paperwork. If you have not heard from [place] regarding the results in 2 weeks, please contact this office.

## 2016-03-25 NOTE — Progress Notes (Signed)
MRN: [id number] DOB: 11-22-1980  Subjective:   Sheri Robinson is a 35 y.o. female presenting for follow up on gall bladder disease, hypokalemia, anemia.   Patient is taking 6mEq K+ twice daily. Reports ongoing fatigue, mild nausea resolved with Zofran ODT. Denies fever, vomiting, abdominal pain, chest pain, shob, heart racing, palpitations, diarrhea, bloody stool. Has a history of severe anemia requiring transfusions in 2013, closely associated with her history of DVT, PE.   Sheri Robinson has a current medication list which includes the following prescription(s): amlodipine, blood pressure cuff, cetirizine hcl, chlorthalidone, cyanocobalamin, linaclotide, omeprazole, ondansetron, potassium chloride sa, and triamcinolone. Also is allergic to amoxicillin and penicillins.  Sheri Robinson  has a past medical history of DVT (deep venous thrombosis) ([place]); Hypertension; Miscarriage; and Pulmonary embolism ([place]). Also  has a past surgical history that includes Myomectomy and IVC FILTER PLACEMENT ([place] [facility]).  Objective:   Vitals: BP 122/84 (BP Location: Left Arm, Patient Position: Sitting, Cuff Size: Large)   Pulse 81   Temp 98.3 F (36.8 C) (Oral)   Resp 16   Ht 5\' 8"  (1.727 m)   Wt 260 lb (117.9 kg)   LMP 03/19/2016   SpO2 98%   BMI 39.53 kg/m   Physical Exam  Constitutional: She is oriented to person, place, and time. She appears well-developed and well-nourished.  HENT:  Mouth/Throat: Oropharynx is clear and moist.  Eyes: No scleral icterus.  Cardiovascular: Normal rate, regular rhythm and intact distal pulses.  Exam reveals no gallop and no friction rub.   No murmur heard. Pulmonary/Chest: No respiratory distress. She has no wheezes. She has no rales.  Abdominal: Soft. Bowel sounds are normal. She exhibits no distension and no mass. There is tenderness (mild RUQ with deep palpation). There is no guarding.  Neurological: She is alert and oriented to person, place, and time.  Skin: Skin is warm  and dry.   Results for orders placed or performed in visit on 03/25/16 (from the past 72 hour(s))  Potassium     Status: Abnormal   Collection Time: 03/25/16 10:26 AM  Result Value Ref Range   Potassium 3.3 (L) 3.5 - 5.3 mmol/L  Ferritin     Status: None   Collection Time: 03/25/16 10:26 AM  Result Value Ref Range   Ferritin 67 10 - 154 ng/mL  POCT CBC     Status: Abnormal   Collection Time: 03/25/16 10:38 AM  Result Value Ref Range   WBC 5.9 4.6 - 10.2 K/uL   Lymph, poc 1.5 0.6 - 3.4   POC LYMPH PERCENT 25.8 10 - 50 %L   MID (cbc) 0.4 0 - 0.9   POC MID % 7.5 0 - 12 %M   POC Granulocyte 3.9 2 - 6.9   Granulocyte percent 66.7 37 - 80 %G   RBC 3.61 (A) 4.04 - 5.48 M/uL   Hemoglobin 10.7 (A) 12.2 - 16.2 g/dL   HCT, POC 30.2 (A) 37.7 - 47.9 %   MCV 83.7 80 - 97 fL   MCH, POC 29.6 27 - 31.2 pg   MCHC 35.4 31.8 - 35.4 g/dL   RDW, POC 14.7 %   Platelet Count, POC 271 142 - 424 K/uL   MPV 7.6 0 - 99.8 fL    Assessment and Plan :   I discussed symptoms warranting visit to the [facility] ED. Patient verbalized understanding.  1. Hypokalemia - Potassium level pending, continue 14mEq K+ BID.  2. Gall bladder disease 3. Common bile duct  dilatation 4. RUQ pain - Stable, keep appointment with General Surgery on 03/27/2016.  5. Anemia, unspecified - Start iron supplementation, docusate for constipation associated with this therapy.  Jaynee Eagles, PA-C [facility] [phone] 03/25/2016 10:10 AM

## 2016-03-27 ENCOUNTER — Ambulatory Visit: Payer: Self-pay | Admitting: General Surgery

## 2016-03-27 ENCOUNTER — Ambulatory Visit (INDEPENDENT_AMBULATORY_CARE_PROVIDER_SITE_OTHER): Payer: BLUE CROSS/BLUE SHIELD | Admitting: Physician Assistant

## 2016-03-27 VITALS — BP 120/74 | HR 97 | Temp 99.1°F | Resp 18 | Ht 68.0 in | Wt 261.6 lb

## 2016-03-27 DIAGNOSIS — Z8639 Personal history of other endocrine, nutritional and metabolic disease: Secondary | ICD-10-CM | POA: Diagnosis not present

## 2016-03-27 DIAGNOSIS — E876 Hypokalemia: Secondary | ICD-10-CM

## 2016-03-27 DIAGNOSIS — I10 Essential (primary) hypertension: Secondary | ICD-10-CM | POA: Diagnosis not present

## 2016-03-27 DIAGNOSIS — D649 Anemia, unspecified: Secondary | ICD-10-CM | POA: Diagnosis not present

## 2016-03-27 MED ORDER — LISINOPRIL 10 MG PO TABS: 10.0000 mg | ORAL_TABLET | Freq: Every day | ORAL | 0 refills | Status: DC

## 2016-03-27 NOTE — Progress Notes (Signed)
KERIANNE GURR  MRN: [id number] DOB: Jul 14, 1980  Subjective:  Sheri Robinson is a 35 y.o. female seen in office today for follow up on gallbladder disease, hypokalemia, anemia.   In terms of gallbladder disease, pt met with the surgeon from [facility] today. They recommended that she have a cholesystectomy with ERCP during surgery. Dr. Marlou Starks is scheduling her surgery asap.   In terms of hypokalemia, patient is still taking 10mq K+ twice daily. Pt notes that she is still nauseous but she has not vomited. Of note, pt is taking chlorthalidone 238mdaily since 01/22/2016 for hypertension management.   In terms of anemia, pt is fatigued all the time. Pt was told in 2013 she was anemic. She was on iron pills until 2016. She stopped when she moved here because her blood count was up. Pt was also vitamin d deficient in 2013 but that also resolved before moving here.   Review of Systems  Constitutional: Negative for chills and fever.  Cardiovascular: Negative for chest pain and palpitations.  Gastrointestinal: Positive for nausea. Negative for abdominal pain, blood in stool, diarrhea and vomiting.  Genitourinary: Negative for hematuria.  Musculoskeletal: Positive for myalgias ( most notably in left leg).  Neurological: Positive for headaches. Negative for dizziness, tremors, weakness and numbness.   There are no active problems to display for this patient.   Current Outpatient Prescriptions on File Prior to Visit  Medication Sig Dispense Refill  . amLODipine (NORVASC) 10 MG tablet Take 1 tablet (10 mg total) by mouth daily. 30 tablet 3  . Blood Pressure Monitoring (BLOOD PRESSURE CUFF) MISC 1 Device by Does not apply route daily. 1 each 0  . Cetirizine HCl 10 MG CAPS Take 1 capsule (10 mg total) by mouth daily. 30 capsule 12  . chlorthalidone (HYGROTON) 25 MG tablet Take 1 tablet (25 mg total) by mouth daily. 30 tablet 3  . cyanocobalamin 500 MCG tablet Take 500 mcg by mouth daily.    . Marland Kitchenlinaclotide (LINZESS) 145 MCG CAPS capsule Take 145 mcg by mouth daily before breakfast.    . omeprazole (PRILOSEC) 40 MG capsule Take 40 mg by mouth daily.    . ondansetron (ZOFRAN ODT) 4 MG disintegrating tablet Take 1 tablet (4 mg total) by mouth every 8 (eight) hours as needed for nausea or vomiting. 20 tablet 0  . potassium chloride SA (K-DUR,KLOR-CON) 20 MEQ tablet Take 1 tablet (20 mEq total) by mouth 2 (two) times daily. 60 tablet 0  . triamcinolone (NASACORT) 55 MCG/ACT AERO nasal inhaler Place 2 sprays into the nose daily. 1 Inhaler 12   No current facility-administered medications on file prior to visit.     Allergies  Allergen Reactions  . Amoxicillin   . Penicillins     Objective:  BP 120/74 (BP Location: Right Arm, Patient Position: Sitting, Cuff Size: Large)   Pulse 97   Temp 99.1 F (37.3 C) (Oral)   Resp 18   Ht _0  (1.727 m)   Wt 261 lb 9.6 oz (118.7 kg)   LMP 03/19/2016   SpO2 97%   BMI 39.78 kg/m   Physical Exam  Constitutional: She is oriented to person, place, and time and well-developed, well-nourished, and in no distress.  HENT:  Head: Normocephalic and atraumatic.  Eyes: Conjunctivae are normal.  Neck: Normal range of motion.  Cardiovascular: Normal rate, regular rhythm and normal heart sounds.   Pulmonary/Chest: Effort normal.  Abdominal: Soft. Normal appearance and bowel sounds are normal.  There is tenderness in the epigastric area.  Neurological: She is alert and oriented to person, place, and time. Gait normal.  Skin: Skin is warm and dry.  Psychiatric: Affect normal.  Vitals reviewed.   Assessment and Plan :  This case was precepted with Dr. Linna Darner  1. Anemia, unspecified type - Reticulocytes - Pathologist smear review - POC Hemoccult Bld/Stl (3-Cd Home Screen); Future  2. Hypokalemia -Discontinue chlorthaladone, begin lisinopril 38m daily.  -Decrease to one KCl 20 MEQ tablet daily - Potassium - CBC with  Differential/Platelet -Follow up in two days for repeat potassium and recheck bp  3. History of vitamin D deficiency - Vitamin D, 25-hydroxy  4. Essential hypertension -Discontinue chlorthalidone -Continue amlodipine  - lisinopril (PRINIVIL,ZESTRIL) 10 MG tablet; Take 1 tablet (10 mg total) by mouth daily.  Dispense: 30 tablet; Refill: 0 -Recheck bp in 2 days   BTenna DelainePA-C  [facility] 03/27/2016 6:09 PM

## 2016-03-27 NOTE — Patient Instructions (Addendum)
Decrease to one potasium pill a day and follow up on Wednesday for repeat labs.  Stop taking chlorthalidone.  Begin taking lisinopril.  If you develop any new concerning symptoms or any chest pain, palpitations, blood in stool, or worsening abdominal pain, seek care immediately.   IF you received an x-ray today, you will receive an invoice from [facility]. Please contact [facility] at [phone] with questions or concerns regarding your invoice.   IF you received labwork today, you will receive an invoice from [employer]. Please contact [facility] at [phone] with questions or concerns regarding your invoice.   Our billing staff will not be able to assist you with questions regarding bills from these companies.  You will be contacted with the lab results as soon as they are available. The fastest way to get your results is to activate your My Chart account. Instructions are located on the last page of this paperwork. If you have not heard from [place] regarding the results in 2 weeks, please contact this office.

## 2016-03-28 LAB — CBC WITH DIFFERENTIAL/PLATELET
BASOS ABS: 0 {cells}/uL (ref 0–200)
Basophils Relative: 0 %
EOS PCT: 1 %
Eosinophils Absolute: 66 cells/uL (ref 15–500)
HCT: 31.8 % — ABNORMAL LOW (ref 35.0–45.0)
Hemoglobin: 10.7 g/dL — ABNORMAL LOW (ref 11.7–15.5)
Lymphocytes Relative: 24 %
Lymphs Abs: 1584 cells/uL (ref 850–3900)
MCH: 29.2 pg (ref 27.0–33.0)
MCHC: 33.6 g/dL (ref 32.0–36.0)
MCV: 86.6 fL (ref 80.0–100.0)
MONOS PCT: 7 %
MPV: 10.4 fL (ref 7.5–12.5)
Monocytes Absolute: 462 cells/uL (ref 200–950)
NEUTROS ABS: 4488 {cells}/uL (ref 1500–7800)
Neutrophils Relative %: 68 %
PLATELETS: 288 10*3/uL (ref 140–400)
RBC: 3.67 MIL/uL — AB (ref 3.80–5.10)
RDW: 14.7 % (ref 11.0–15.0)
WBC: 6.6 10*3/uL (ref 3.8–10.8)

## 2016-03-28 LAB — RETICULOCYTES
ABS RETIC: 84410 {cells}/uL — AB (ref 20000–80000)
RBC.: 3.67 MIL/uL — ABNORMAL LOW (ref 3.80–5.10)
Retic Ct Pct: 2.3 %

## 2016-03-28 LAB — POTASSIUM: POTASSIUM: 3.4 mmol/L — AB (ref 3.5–5.3)

## 2016-03-29 ENCOUNTER — Ambulatory Visit: Payer: BLUE CROSS/BLUE SHIELD

## 2016-03-29 ENCOUNTER — Other Ambulatory Visit: Payer: Self-pay | Admitting: Physician Assistant

## 2016-03-29 DIAGNOSIS — E876 Hypokalemia: Secondary | ICD-10-CM

## 2016-03-29 LAB — VITAMIN D 25 HYDROXY (VIT D DEFICIENCY, FRACTURES): VIT D 25 HYDROXY: 27 ng/mL — AB (ref 30–100)

## 2016-03-30 ENCOUNTER — Other Ambulatory Visit: Payer: Self-pay | Admitting: Physician Assistant

## 2016-03-30 ENCOUNTER — Other Ambulatory Visit (INDEPENDENT_AMBULATORY_CARE_PROVIDER_SITE_OTHER): Payer: BLUE CROSS/BLUE SHIELD

## 2016-03-30 DIAGNOSIS — E876 Hypokalemia: Secondary | ICD-10-CM

## 2016-03-30 DIAGNOSIS — D649 Anemia, unspecified: Secondary | ICD-10-CM | POA: Diagnosis not present

## 2016-03-30 LAB — POC HEMOCCULT BLD/STL (HOME/3-CARD/SCREEN)
Card #2 Fecal Occult Blod, POC: NEGATIVE
FECAL OCCULT BLD: NEGATIVE
FECAL OCCULT BLD: NEGATIVE

## 2016-03-30 LAB — POTASSIUM: POTASSIUM: 3.4 mmol/L — AB (ref 3.5–5.3)

## 2016-03-30 LAB — PATHOLOGIST SMEAR REVIEW

## 2016-03-31 ENCOUNTER — Other Ambulatory Visit: Payer: Self-pay | Admitting: Physician Assistant

## 2016-03-31 DIAGNOSIS — D649 Anemia, unspecified: Secondary | ICD-10-CM

## 2016-04-01 ENCOUNTER — Ambulatory Visit (INDEPENDENT_AMBULATORY_CARE_PROVIDER_SITE_OTHER): Payer: BLUE CROSS/BLUE SHIELD | Admitting: Physician Assistant

## 2016-04-01 VITALS — BP 110/78 | HR 91 | Temp 98.6°F | Resp 16 | Ht 68.0 in | Wt 259.2 lb

## 2016-04-01 DIAGNOSIS — E876 Hypokalemia: Secondary | ICD-10-CM

## 2016-04-01 DIAGNOSIS — I10 Essential (primary) hypertension: Secondary | ICD-10-CM

## 2016-04-01 DIAGNOSIS — Z23 Encounter for immunization: Secondary | ICD-10-CM | POA: Diagnosis not present

## 2016-04-01 DIAGNOSIS — D649 Anemia, unspecified: Secondary | ICD-10-CM

## 2016-04-01 LAB — COMPLETE METABOLIC PANEL WITH GFR
ALBUMIN: 4.1 g/dL (ref 3.6–5.1)
ALK PHOS: 47 U/L (ref 33–115)
ALT: 26 U/L (ref 6–29)
AST: 22 U/L (ref 10–30)
BUN: 10 mg/dL (ref 7–25)
CALCIUM: 9.1 mg/dL (ref 8.6–10.2)
CO2: 27 mmol/L (ref 20–31)
CREATININE: 0.98 mg/dL (ref 0.50–1.10)
Chloride: 99 mmol/L (ref 98–110)
GFR, Est African American: 85 mL/min (ref >=60)
GFR, Est Non African American: 74 mL/min (ref >=60)
GLUCOSE: 87 mg/dL (ref 65–99)
Potassium: 3.8 mmol/L (ref 3.5–5.3)
Sodium: 137 mmol/L (ref 135–146)
TOTAL PROTEIN: 7.4 g/dL (ref 6.1–8.1)
Total Bilirubin: 0.7 mg/dL (ref 0.2–1.2)

## 2016-04-01 LAB — IBC PANEL
%SAT: 23 % (ref 11–50)
TIBC: 375 ug/dL (ref 250–450)
UIBC: 290 ug/dL (ref 125–400)

## 2016-04-01 LAB — IRON: Iron: 85 ug/dL (ref 40–190)

## 2016-04-01 MED ORDER — LISINOPRIL 10 MG PO TABS: 10.0000 mg | ORAL_TABLET | Freq: Every day | ORAL | 0 refills | Status: DC

## 2016-04-01 MED ORDER — AMLODIPINE BESYLATE 10 MG PO TABS: 10.0000 mg | ORAL_TABLET | Freq: Every day | ORAL | 3 refills | Status: DC

## 2016-04-01 NOTE — Progress Notes (Signed)
Sheri Robinson  MRN: [id number] DOB: 07-02-1980  Subjective:  Sheri Robinson is a 35 y.o. female seen in office today for follow up on hypokalemia, bp, and anemia.   In terms of bp and hypokalemia, pt was last seen in office on 03/27/16. During this visit, her chlorthalidone was discontinued and lisinopril 10mg  was initiated. Pt's most recent potassium was on 03/30/16 and was 3.4. She is still taking 20 MEQ of oral potassium daily. She denies any chest pain, palpitations, SOB, nausea, vomiting, headache, or blurred vision. Pt has checked her bp once out of office and notes it was around 115/70. She is tolerating the lisinopril well and has no complaints of side effects.   In terms of anemia, pt was last told to be anemia in 2014. At this time it was due to iron deficiency and was put on iron supplements until 2016 when this resolved. Pt reports she was checked for sickle cell trait three years ago and tested negative.She states that her periods have been regular since her myomectomy in 2014. Her cycles last about 3-5 days and she notes she will go through maybe ten pads at most on her heaviest day and 3-5 on light days.  She will have pre op labs on 04/04/16 and Dr Marlou Starks is performing cholecystectomy on 04/05/16.      Review of Systems  Constitutional: Positive for fatigue. Negative for fever.  Cardiovascular: Negative for chest pain and palpitations.  Gastrointestinal: Positive for nausea.  Neurological: Positive for light-headedness (intermittent, not present today ). Negative for dizziness and numbness.    There are no active problems to display for this patient.   Current Outpatient Prescriptions on File Prior to Visit  Medication Sig Dispense Refill  . amLODipine (NORVASC) 10 MG tablet Take 1 tablet (10 mg total) by mouth daily. 30 tablet 3  . Blood Pressure Monitoring (BLOOD PRESSURE CUFF) MISC 1 Device by Does not apply route daily. 1 each 0  . Cetirizine HCl 10 MG CAPS Take 1  capsule (10 mg total) by mouth daily. 30 capsule 12  . linaclotide (LINZESS) 145 MCG CAPS capsule Take 145 mcg by mouth daily before breakfast.    . lisinopril (PRINIVIL,ZESTRIL) 10 MG tablet Take 1 tablet (10 mg total) by mouth daily. 30 tablet 0  . Multiple Vitamins-Minerals (MULTIVITAMIN WITH MINERALS) tablet Take 1 tablet by mouth daily.    Marland Kitchen omeprazole (PRILOSEC) 40 MG capsule Take 40 mg by mouth daily.    . ondansetron (ZOFRAN ODT) 4 MG disintegrating tablet Take 1 tablet (4 mg total) by mouth every 8 (eight) hours as needed for nausea or vomiting. 20 tablet 0  . potassium chloride SA (K-DUR,KLOR-CON) 20 MEQ tablet Take 1 tablet (20 mEq total) by mouth 2 (two) times daily. (Patient taking differently: Take 20 mEq by mouth daily. ) 60 tablet 0  . triamcinolone (NASACORT) 55 MCG/ACT AERO nasal inhaler Place 2 sprays into the nose daily. (Patient taking differently: Place 2 sprays into the nose daily as needed. ) 1 Inhaler 12   No current facility-administered medications on file prior to visit.     Allergies  Allergen Reactions  . Amoxicillin Anaphylaxis  . Penicillins Anaphylaxis    Objective:  BP 110/78   Pulse 91   Temp 98.6 F (37 C) (Oral)   Resp 16   Ht 5\' 8"  (1.727 m) Comment: per pt  Wt 259 lb 3.2 oz (117.6 kg)   LMP 03/19/2016   SpO2 96%  BMI 39.41 kg/m   Physical Exam  Constitutional: She is oriented to person, place, and time and well-developed, well-nourished, and in no distress.  HENT:  Head: Normocephalic and atraumatic.  Eyes: Conjunctivae are normal.  Neck: Normal range of motion.  Cardiovascular: Normal rate, regular rhythm and normal heart sounds.   Pulmonary/Chest: Effort normal.  Abdominal: There is tenderness in the right upper quadrant and epigastric area.  Neurological: She is alert and oriented to person, place, and time. Gait normal.  Skin: Skin is warm and dry.  Psychiatric: Affect normal.  Vitals reviewed.   BP Readings from Last 3  Encounters:  04/01/16 110/78  03/27/16 120/74  03/25/16 122/84    Assessment and Plan :  1. Hypokalemia -Discontinue oral potassium and await results. - COMPLETE METABOLIC PANEL WITH GFR  2. Anemia, unspecified type - IBC Panel - Iron - Hemoglobinopathy Evaluation -Depending on lab results, may need referral to hematology after recovery from cholecystectomy.   3. Essential hypertension -Continue current regimen; pt informed to check bp if she feels lightheaded. Goal is <140/90 and >100/60 - lisinopril (PRINIVIL,ZESTRIL) 10 MG tablet; Take 1 tablet (10 mg total) by mouth daily.  Dispense: 90 tablet; Refill: 0 - amLODipine (NORVASC) 10 MG tablet; Take 1 tablet (10 mg total) by mouth daily.  Dispense: 30 tablet; Refill: [place] PA-C  [facility] 04/01/2016 12:41 PM

## 2016-04-01 NOTE — Patient Instructions (Addendum)
Stop taking oral potassium until you hear back from me.  I will fax over your labs to [facility]. We will plan a follow up after you have recovered from surgery.  I have refilled your lisinopril. Continue to monitor your bp, please call if bp <100/60  IF you received an x-ray today, you will receive an invoice from [facility]. Please contact [facility] at [phone] with questions or concerns regarding your invoice.   IF you received labwork today, you will receive an invoice from [employer]. Please contact [facility] at [phone] with questions or concerns regarding your invoice.   Our billing staff will not be able to assist you with questions regarding bills from these companies.  You will be contacted with the lab results as soon as they are available. The fastest way to get your results is to activate your My Chart account. Instructions are located on the last page of this paperwork. If you have not heard from [place] regarding the results in 2 weeks, please contact this office.

## 2016-04-03 NOTE — Pre-Procedure Instructions (Addendum)
LUTRICIA HICKSON  04/03/2016      [facility] ([facility] [place] - [id number] [place] [address] Phone: [phone] Fax: [phone]    Your procedure is scheduled on Wed, Oct 11 @ 8:30 AM  Report to [place] at 6:30 AM  Call this number if you have problems the morning of surgery:  [phone]   Remember:  Do not eat food or drink liquids after midnight.  Take these medicines the morning of surgery with A SIP OF WATER inhaler(bring) Amlodipine(Norvasc),Zyrtec(Cetirizine),Omeprazole(Prilosec),Zofran(Ondansetron-if needed),and Nasocort(if needed)              No Goody's,BC's,Aleve,Advil,Motrin,Ibuprofen,Aspirin,Fish Oil,or any Herbal Medications. No vitamins   Do not wear jewelry, make-up or nail polish.  Do not wear lotions, powders,perfumes, or deoderant.  Do not shave 48 hours prior to surgery.    Do not bring valuables to the hospital.  [facility] is not responsible for any belongings or valuables.  Contacts, dentures or bridgework may not be worn into surgery.  Leave your suitcase in the car.  After surgery it may be brought to your room.  For patients admitted to the hospital, discharge time will be determined by your treatment team.  Patients discharged the day of surgery will not be allowed to drive home.    [facility]  Before surgery, you can play an important role.  Because skin is not sterile, your skin needs to be as free of germs as possible.  You can reduce the number of germs on you skin by washing with CHG (chlorahexidine gluconate) soap before surgery.  CHG is an antiseptic cleaner which kills germs and bonds with the skin to continue killing germs even after washing.  Please DO NOT use if you have an allergy to CHG or antibacterial soaps.  If your skin becomes reddened/irritated stop using the CHG and inform your nurse when you arrive at  Short Stay.  Do not shave (including legs and underarms) for at least 48 hours prior to the first CHG shower.  You may shave your face.  Please follow these instructions carefully:   1.  Shower with CHG Soap the night before surgery and the                                morning of Surgery.  2.  If you choose to wash your hair, wash your hair first as usual with your       normal shampoo.  3.  After you shampoo, rinse your hair and body thoroughly to remove the                      Shampoo.  4.  Use CHG as you would any other liquid soap.  You can apply chg directly       to the skin and wash gently with scrungie or a clean washcloth.  5.  Apply the CHG Soap to your body ONLY FROM THE NECK DOWN.        Do not use on open wounds or open sores.  Avoid contact with your eyes,       ears, mouth and genitals (private parts).  Wash genitals (private parts)       with your normal soap.  6.  Wash thoroughly, paying special attention to the area where  your surgery        will be performed.  7.  Thoroughly rinse your body with warm water from the neck down.  8.  DO NOT shower/wash with your normal soap after using and rinsing off       the CHG Soap.  9.  Pat yourself dry with a clean towel.            10.  Wear clean pajamas.            11.  Place clean sheets on your bed the night of your first shower and do not        sleep with pets.  Day of Surgery  Do not apply any lotions/deoderants the morning of surgery.  Please wear clean clothes to the hospital/surgery center.   Please read over the  fact sheets that you were given.

## 2016-04-04 ENCOUNTER — Encounter (HOSPITAL_COMMUNITY)
Admission: RE | Admit: 2016-04-04 | Discharge: 2016-04-04 | Disposition: A | Discharge status: Home / Self Care | Payer: BLUE CROSS/BLUE SHIELD | Source: Ambulatory Visit | Attending: General Surgery | Admitting: General Surgery

## 2016-04-04 ENCOUNTER — Telehealth: Payer: Self-pay | Admitting: *Deleted

## 2016-04-04 ENCOUNTER — Encounter (HOSPITAL_COMMUNITY): Payer: Self-pay

## 2016-04-04 DIAGNOSIS — K8020 Calculus of gallbladder without cholecystitis without obstruction: Secondary | ICD-10-CM | POA: Diagnosis present

## 2016-04-04 DIAGNOSIS — K8010 Calculus of gallbladder with chronic cholecystitis without obstruction: Secondary | ICD-10-CM | POA: Diagnosis not present

## 2016-04-04 DIAGNOSIS — Z6839 Body mass index (BMI) 39.0-39.9, adult: Secondary | ICD-10-CM | POA: Diagnosis not present

## 2016-04-04 DIAGNOSIS — I739 Peripheral vascular disease, unspecified: Secondary | ICD-10-CM | POA: Diagnosis not present

## 2016-04-04 DIAGNOSIS — Z86711 Personal history of pulmonary embolism: Secondary | ICD-10-CM | POA: Diagnosis not present

## 2016-04-04 DIAGNOSIS — D649 Anemia, unspecified: Secondary | ICD-10-CM | POA: Diagnosis not present

## 2016-04-04 DIAGNOSIS — Z880 Allergy status to penicillin: Secondary | ICD-10-CM | POA: Diagnosis not present

## 2016-04-04 DIAGNOSIS — I10 Essential (primary) hypertension: Secondary | ICD-10-CM | POA: Diagnosis not present

## 2016-04-04 DIAGNOSIS — Z86718 Personal history of other venous thrombosis and embolism: Secondary | ICD-10-CM | POA: Diagnosis not present

## 2016-04-04 DIAGNOSIS — G43909 Migraine, unspecified, not intractable, without status migrainosus: Secondary | ICD-10-CM | POA: Diagnosis not present

## 2016-04-04 DIAGNOSIS — J45909 Unspecified asthma, uncomplicated: Secondary | ICD-10-CM | POA: Diagnosis not present

## 2016-04-04 DIAGNOSIS — K5900 Constipation, unspecified: Secondary | ICD-10-CM | POA: Diagnosis not present

## 2016-04-04 DIAGNOSIS — F419 Anxiety disorder, unspecified: Secondary | ICD-10-CM | POA: Diagnosis not present

## 2016-04-04 DIAGNOSIS — K219 Gastro-esophageal reflux disease without esophagitis: Secondary | ICD-10-CM | POA: Diagnosis not present

## 2016-04-04 DIAGNOSIS — R011 Cardiac murmur, unspecified: Secondary | ICD-10-CM | POA: Diagnosis not present

## 2016-04-04 DIAGNOSIS — M1990 Unspecified osteoarthritis, unspecified site: Secondary | ICD-10-CM | POA: Diagnosis not present

## 2016-04-04 HISTORY — DX: Unspecified asthma, uncomplicated: J45.909

## 2016-04-04 HISTORY — DX: Cardiac murmur, unspecified: R01.1

## 2016-04-04 HISTORY — DX: Anemia, unspecified: D64.9

## 2016-04-04 HISTORY — DX: Pneumonia, unspecified organism: J18.9

## 2016-04-04 HISTORY — DX: Unspecified osteoarthritis, unspecified site: M19.90

## 2016-04-04 HISTORY — DX: Peripheral vascular disease, unspecified: I73.9

## 2016-04-04 HISTORY — DX: Gastro-esophageal reflux disease without esophagitis: K21.9

## 2016-04-04 LAB — BASIC METABOLIC PANEL
ANION GAP: 8 (ref 5–15)
BUN: 6 mg/dL (ref 6–20)
CALCIUM: 9 mg/dL (ref 8.9–10.3)
CO2: 24 mmol/L (ref 22–32)
CREATININE: 0.97 mg/dL (ref 0.44–1.00)
Chloride: 106 mmol/L (ref 101–111)
GFR calc Af Amer: >60 mL/min (ref >60)
GLUCOSE: 107 mg/dL — AB (ref 65–99)
Potassium: 3.4 mmol/L — ABNORMAL LOW (ref 3.5–5.1)
Sodium: 138 mmol/L (ref 135–145)

## 2016-04-04 LAB — HEMOGLOBINOPATHY EVALUATION
HCT: 32.7 % — ABNORMAL LOW (ref 35.0–45.0)
HEMOGLOBIN: 11.1 g/dL — AB (ref 11.7–15.5)
HGB A2 QUANT: 1.8 % (ref 1.8–3.5)
Hgb A: 97.2 % (ref >96.0)
MCH: 29.7 pg (ref 27.0–33.0)
MCV: 87.4 fL (ref 80.0–100.0)
RBC: 3.74 MIL/uL — ABNORMAL LOW (ref 3.80–5.10)
RDW: 14 % (ref 11.0–15.0)

## 2016-04-04 LAB — HCG, SERUM, QUALITATIVE: PREG SERUM: NEGATIVE

## 2016-04-04 LAB — CBC
HCT: 32.8 % — ABNORMAL LOW (ref 36.0–46.0)
HEMOGLOBIN: 10.7 g/dL — AB (ref 12.0–15.0)
MCH: 29.2 pg (ref 26.0–34.0)
MCHC: 32.6 g/dL (ref 30.0–36.0)
MCV: 89.4 fL (ref 78.0–100.0)
PLATELETS: 243 10*3/uL (ref 150–400)
RBC: 3.67 MIL/uL — ABNORMAL LOW (ref 3.87–5.11)
RDW: 14.2 % (ref 11.5–15.5)
WBC: 6.9 10*3/uL (ref 4.0–10.5)

## 2016-04-04 MED ORDER — CIPROFLOXACIN IN D5W 400 MG/200ML IV SOLN
400.0000 mg | INTRAVENOUS | Status: AC
  Administered 2016-04-05: 400 mg via INTRAVENOUS
  Filled 2016-04-04: qty 200

## 2016-04-04 NOTE — Telephone Encounter (Signed)
Patient left message on vm about lab results.  Tried calling back.  Could not leave message.

## 2016-04-04 NOTE — Anesthesia Preprocedure Evaluation (Addendum)
Anesthesia Evaluation  Patient identified by MRN, date of birth, ID band Patient awake    Reviewed: Allergy & Precautions, NPO status , Patient's Chart, lab work & pertinent test results  Airway Mallampati: II  TM Distance: >3 FB Neck ROM: Full    Dental   Pulmonary asthma ,    breath sounds clear to auscultation       Cardiovascular hypertension, Pt. on medications + Peripheral Vascular Disease (Hx DVT and PE in 2014)   Rhythm:Regular Rate:Normal     Neuro/Psych negative neurological ROS     GI/Hepatic Neg liver ROS, GERD  ,  Endo/Other  Morbid obesity  Renal/GU negative Renal ROS     Musculoskeletal   Abdominal   Peds  Hematology  (+) anemia ,   Anesthesia Other Findings   Reproductive/Obstetrics                            Lab Results  Component Value Date   WBC 6.9 04/04/2016   HGB 10.7 (L) 04/04/2016   HCT 32.8 (L) 04/04/2016   MCV 89.4 04/04/2016   PLT 243 04/04/2016   Lab Results  Component Value Date   CREATININE 0.97 04/04/2016   BUN 6 04/04/2016   NA 138 04/04/2016   K 3.4 (L) 04/04/2016   CL 106 04/04/2016   CO2 24 04/04/2016    Anesthesia Physical Anesthesia Plan  ASA: II  Anesthesia Plan: General   Post-op Pain Management:    Induction: Intravenous  Airway Management Planned: Oral ETT  Additional Equipment:   Intra-op Plan:   Post-operative Plan: Extubation in OR  Informed Consent: I have reviewed the patients History and Physical, chart, labs and discussed the procedure including the risks, benefits and alternatives for the proposed anesthesia with the patient or authorized representative who has indicated his/her understanding and acceptance.   Dental advisory given  Plan Discussed with: CRNA  Anesthesia Plan Comments:        Anesthesia Quick Evaluation

## 2016-04-04 NOTE — Progress Notes (Signed)
Anesthesia chart review: Patient is a 35 year old female scheduled for cholecystectomy on 04/05/2016 by Dr. Marlou Starks.  History includes nonsmoker, hypertension, LLE DVT and PE '14 (following myomectomy for fibroids, multiple transfusions), IVC filter '14, murmur (reports 2014 echo at [facility] was "negative"), asthma, GERD, anemia. BMI is consistent with obesity (borderline morbid obesity). PCP is listed as [name], PA-C with [facility]. She is aware of patient's plans for surgery.  Meds include albuterol, amlodipine, sertraline, Linzess, lisinopril, Prilosec, Zofran, Nasacort. She was recently on KCl for hypokalemia.  BP (!) 112/98   Pulse 100   Temp 37.5 C   Resp 20   Ht 5\' 8"  (1.727 m)   Wt 261 lb 3.2 oz (118.5 kg)   LMP 03/19/2016   SpO2 100%   BMI 39.72 kg/m   03/23/16 EKG: NSR, nonspecific T wave abnormality.  03/22/16 LLE venous duplex: IMPRESSION: No evidence of deep venous thrombosis.  Preoperative labs noted. K 3.4, glucose 107, Cr 0.97, H/H 10.7/32.8. Serum pregnancy test was negative.  BP/HR will be rechecked on arrival. Anesthesiologist will also evaluate patient on the day of surgery. I do not have her 2014 echo report, but was reported as normal. (PCP documented "normal heart sounds" at patient's recent evaluation.) If vitals are acceptable and otherwise no acute changes then I anticipate she can proceed as planned.  George Hugh [facility] Short Stay Center/Anesthesiology Phone [phone] 04/04/2016 11:59 AM

## 2016-04-05 ENCOUNTER — Encounter (HOSPITAL_COMMUNITY): Payer: Self-pay | Admitting: *Deleted

## 2016-04-05 ENCOUNTER — Ambulatory Visit (HOSPITAL_COMMUNITY)
Admission: RE | Admit: 2016-04-05 | Discharge: 2016-04-06 | Disposition: A | Discharge status: Home / Self Care | Payer: BLUE CROSS/BLUE SHIELD | Source: Ambulatory Visit | Attending: General Surgery | Admitting: General Surgery

## 2016-04-05 ENCOUNTER — Encounter (HOSPITAL_COMMUNITY)
Admission: RE | Disposition: A | Discharge status: Home / Self Care | Payer: Self-pay | Source: Ambulatory Visit | Attending: General Surgery

## 2016-04-05 ENCOUNTER — Ambulatory Visit (HOSPITAL_COMMUNITY): Payer: BLUE CROSS/BLUE SHIELD

## 2016-04-05 ENCOUNTER — Ambulatory Visit (HOSPITAL_COMMUNITY): Payer: BLUE CROSS/BLUE SHIELD | Admitting: Vascular Surgery

## 2016-04-05 ENCOUNTER — Ambulatory Visit (HOSPITAL_COMMUNITY): Payer: BLUE CROSS/BLUE SHIELD | Admitting: Anesthesiology

## 2016-04-05 DIAGNOSIS — R011 Cardiac murmur, unspecified: Secondary | ICD-10-CM | POA: Insufficient documentation

## 2016-04-05 DIAGNOSIS — I739 Peripheral vascular disease, unspecified: Secondary | ICD-10-CM | POA: Insufficient documentation

## 2016-04-05 DIAGNOSIS — K219 Gastro-esophageal reflux disease without esophagitis: Secondary | ICD-10-CM | POA: Insufficient documentation

## 2016-04-05 DIAGNOSIS — F419 Anxiety disorder, unspecified: Secondary | ICD-10-CM | POA: Insufficient documentation

## 2016-04-05 DIAGNOSIS — J45909 Unspecified asthma, uncomplicated: Secondary | ICD-10-CM | POA: Insufficient documentation

## 2016-04-05 DIAGNOSIS — D649 Anemia, unspecified: Secondary | ICD-10-CM | POA: Insufficient documentation

## 2016-04-05 DIAGNOSIS — Z86718 Personal history of other venous thrombosis and embolism: Secondary | ICD-10-CM | POA: Insufficient documentation

## 2016-04-05 DIAGNOSIS — Z86711 Personal history of pulmonary embolism: Secondary | ICD-10-CM | POA: Insufficient documentation

## 2016-04-05 DIAGNOSIS — G43909 Migraine, unspecified, not intractable, without status migrainosus: Secondary | ICD-10-CM | POA: Insufficient documentation

## 2016-04-05 DIAGNOSIS — Z6839 Body mass index (BMI) 39.0-39.9, adult: Secondary | ICD-10-CM | POA: Insufficient documentation

## 2016-04-05 DIAGNOSIS — I10 Essential (primary) hypertension: Secondary | ICD-10-CM | POA: Insufficient documentation

## 2016-04-05 DIAGNOSIS — K8010 Calculus of gallbladder with chronic cholecystitis without obstruction: Secondary | ICD-10-CM | POA: Diagnosis not present

## 2016-04-05 DIAGNOSIS — K5900 Constipation, unspecified: Secondary | ICD-10-CM | POA: Insufficient documentation

## 2016-04-05 DIAGNOSIS — M1990 Unspecified osteoarthritis, unspecified site: Secondary | ICD-10-CM | POA: Insufficient documentation

## 2016-04-05 DIAGNOSIS — K8020 Calculus of gallbladder without cholecystitis without obstruction: Secondary | ICD-10-CM | POA: Diagnosis present

## 2016-04-05 DIAGNOSIS — Z880 Allergy status to penicillin: Secondary | ICD-10-CM | POA: Insufficient documentation

## 2016-04-05 HISTORY — PX: CHOLECYSTECTOMY: SHX55

## 2016-04-05 SURGERY — LAPAROSCOPIC CHOLECYSTECTOMY WITH INTRAOPERATIVE CHOLANGIOGRAM
Anesthesia: General | Site: Abdomen

## 2016-04-05 MED ORDER — HYDROMORPHONE HCL 1 MG/ML IJ SOLN
INTRAMUSCULAR | Status: AC
  Filled 2016-04-05: qty 1

## 2016-04-05 MED ORDER — WHITE PETROLATUM GEL
Status: AC
  Administered 2016-04-05: 22:00:00
  Filled 2016-04-05: qty 1

## 2016-04-05 MED ORDER — DEXAMETHASONE SODIUM PHOSPHATE 10 MG/ML IJ SOLN
INTRAMUSCULAR | Status: DC | PRN
  Administered 2016-04-05: 5 mg via INTRAVENOUS

## 2016-04-05 MED ORDER — PROMETHAZINE HCL 25 MG/ML IJ SOLN: 6.2500 mg | INTRAMUSCULAR | Status: DC | PRN

## 2016-04-05 MED ORDER — HYDROCODONE-ACETAMINOPHEN 5-325 MG PO TABS
1.0000 | ORAL_TABLET | ORAL | Status: DC | PRN
  Administered 2016-04-05 – 2016-04-06 (×3): 2 via ORAL
  Filled 2016-04-05 (×4): qty 2

## 2016-04-05 MED ORDER — HEPARIN SODIUM (PORCINE) 5000 UNIT/ML IJ SOLN
5000.0000 [IU] | Freq: Three times a day (TID) | INTRAMUSCULAR | Status: DC
Start: 2016-04-06 — End: 2016-04-06

## 2016-04-05 MED ORDER — LISINOPRIL 10 MG PO TABS
10.0000 mg | ORAL_TABLET | Freq: Every day | ORAL | Status: DC
Start: 2016-04-05 — End: 2016-04-06
  Administered 2016-04-06: 10 mg via ORAL
  Filled 2016-04-05: qty 1

## 2016-04-05 MED ORDER — KCL IN DEXTROSE-NACL 20-5-0.9 MEQ/L-%-% IV SOLN
INTRAVENOUS | Status: DC
  Administered 2016-04-05 (×2): via INTRAVENOUS
  Filled 2016-04-05 (×2): qty 1000

## 2016-04-05 MED ORDER — SODIUM CHLORIDE 0.9 % IR SOLN
Status: DC | PRN
  Administered 2016-04-05: 1

## 2016-04-05 MED ORDER — SUGAMMADEX SODIUM 200 MG/2ML IV SOLN
INTRAVENOUS | Status: DC | PRN
  Administered 2016-04-05: 200 mg via INTRAVENOUS

## 2016-04-05 MED ORDER — BUPIVACAINE-EPINEPHRINE 0.25% -1:200000 IJ SOLN
INTRAMUSCULAR | Status: DC | PRN
  Administered 2016-04-05: 19 mL

## 2016-04-05 MED ORDER — SUCCINYLCHOLINE CHLORIDE 20 MG/ML IJ SOLN
INTRAMUSCULAR | Status: DC | PRN
  Administered 2016-04-05: 120 mg via INTRAVENOUS

## 2016-04-05 MED ORDER — ONDANSETRON HCL 4 MG/2ML IJ SOLN
INTRAMUSCULAR | Status: DC | PRN
  Administered 2016-04-05: 4 mg via INTRAVENOUS

## 2016-04-05 MED ORDER — PHENYLEPHRINE HCL 10 MG/ML IJ SOLN
INTRAMUSCULAR | Status: DC | PRN
  Administered 2016-04-05: 40 ug via INTRAVENOUS
  Administered 2016-04-05: 80 ug via INTRAVENOUS
  Administered 2016-04-05 (×2): 40 ug via INTRAVENOUS

## 2016-04-05 MED ORDER — 0.9 % SODIUM CHLORIDE (POUR BTL) OPTIME
TOPICAL | Status: DC | PRN
  Administered 2016-04-05: 1000 mL

## 2016-04-05 MED ORDER — MORPHINE SULFATE (PF) 2 MG/ML IV SOLN
1.0000 mg | INTRAVENOUS | Status: DC | PRN
  Administered 2016-04-05 – 2016-04-06 (×4): 2 mg via INTRAVENOUS
  Filled 2016-04-05 (×4): qty 1

## 2016-04-05 MED ORDER — MIDAZOLAM HCL 2 MG/2ML IJ SOLN
INTRAMUSCULAR | Status: AC
  Filled 2016-04-05: qty 2

## 2016-04-05 MED ORDER — HYDROMORPHONE HCL 1 MG/ML IJ SOLN
0.2500 mg | INTRAMUSCULAR | Status: DC | PRN
  Administered 2016-04-05 (×3): 0.5 mg via INTRAVENOUS

## 2016-04-05 MED ORDER — ONDANSETRON HCL 4 MG/2ML IJ SOLN
4.0000 mg | Freq: Four times a day (QID) | INTRAMUSCULAR | Status: DC | PRN
Start: 2016-04-05 — End: 2016-04-06
  Administered 2016-04-05 (×2): 4 mg via INTRAVENOUS
  Filled 2016-04-05 (×3): qty 2

## 2016-04-05 MED ORDER — IOPAMIDOL (ISOVUE-300) INJECTION 61%
INTRAVENOUS | Status: AC
  Filled 2016-04-05: qty 50

## 2016-04-05 MED ORDER — PROPOFOL 10 MG/ML IV BOLUS
INTRAVENOUS | Status: AC
  Filled 2016-04-05: qty 20

## 2016-04-05 MED ORDER — EPHEDRINE SULFATE 50 MG/ML IJ SOLN
INTRAMUSCULAR | Status: DC | PRN
  Administered 2016-04-05 (×2): 5 mg via INTRAVENOUS

## 2016-04-05 MED ORDER — PANTOPRAZOLE SODIUM 40 MG PO TBEC
40.0000 mg | DELAYED_RELEASE_TABLET | Freq: Every day | ORAL | Status: DC
  Administered 2016-04-06: 40 mg via ORAL
  Filled 2016-04-05: qty 1

## 2016-04-05 MED ORDER — LIDOCAINE HCL (CARDIAC) 20 MG/ML IV SOLN
INTRAVENOUS | Status: DC | PRN
  Administered 2016-04-05: 60 mg via INTRAVENOUS

## 2016-04-05 MED ORDER — FENTANYL CITRATE (PF) 100 MCG/2ML IJ SOLN
INTRAMUSCULAR | Status: AC
  Filled 2016-04-05: qty 4

## 2016-04-05 MED ORDER — PROPOFOL 10 MG/ML IV BOLUS
INTRAVENOUS | Status: DC | PRN
  Administered 2016-04-05: 200 mg via INTRAVENOUS

## 2016-04-05 MED ORDER — CHLORHEXIDINE GLUCONATE CLOTH 2 % EX PADS: 6.0000 | MEDICATED_PAD | Freq: Once | CUTANEOUS | Status: DC

## 2016-04-05 MED ORDER — ALBUTEROL SULFATE (2.5 MG/3ML) 0.083% IN NEBU: 3.0000 mL | INHALATION_SOLUTION | Freq: Four times a day (QID) | RESPIRATORY_TRACT | Status: DC | PRN

## 2016-04-05 MED ORDER — PHENYLEPHRINE 40 MCG/ML (10ML) SYRINGE FOR IV PUSH (FOR BLOOD PRESSURE SUPPORT)
PREFILLED_SYRINGE | INTRAVENOUS | Status: AC
  Filled 2016-04-05: qty 10

## 2016-04-05 MED ORDER — LACTATED RINGERS IV SOLN
INTRAVENOUS | Status: DC | PRN
  Administered 2016-04-05 (×2): via INTRAVENOUS

## 2016-04-05 MED ORDER — BUPIVACAINE-EPINEPHRINE (PF) 0.25% -1:200000 IJ SOLN
INTRAMUSCULAR | Status: AC
  Filled 2016-04-05: qty 30

## 2016-04-05 MED ORDER — FENTANYL CITRATE (PF) 100 MCG/2ML IJ SOLN
INTRAMUSCULAR | Status: DC | PRN
  Administered 2016-04-05: 100 ug via INTRAVENOUS
  Administered 2016-04-05 (×2): 50 ug via INTRAVENOUS

## 2016-04-05 MED ORDER — MIDAZOLAM HCL 5 MG/5ML IJ SOLN
INTRAMUSCULAR | Status: DC | PRN
  Administered 2016-04-05: 2 mg via INTRAVENOUS

## 2016-04-05 MED ORDER — ROCURONIUM BROMIDE 100 MG/10ML IV SOLN
INTRAVENOUS | Status: DC | PRN
  Administered 2016-04-05: 50 mg via INTRAVENOUS

## 2016-04-05 MED ORDER — AMLODIPINE BESYLATE 10 MG PO TABS
10.0000 mg | ORAL_TABLET | Freq: Every day | ORAL | Status: DC
  Administered 2016-04-06: 10 mg via ORAL
  Filled 2016-04-05: qty 1

## 2016-04-05 MED ORDER — HYDROCODONE-ACETAMINOPHEN 7.5-325 MG PO TABS: 1.0000 | ORAL_TABLET | Freq: Once | ORAL | Status: DC | PRN

## 2016-04-05 MED ORDER — POTASSIUM CHLORIDE CRYS ER 20 MEQ PO TBCR
20.0000 meq | EXTENDED_RELEASE_TABLET | Freq: Every day | ORAL | Status: DC
  Administered 2016-04-05 – 2016-04-06 (×2): 20 meq via ORAL
  Filled 2016-04-05 (×2): qty 1

## 2016-04-05 MED ORDER — SUCCINYLCHOLINE CHLORIDE 200 MG/10ML IV SOSY
PREFILLED_SYRINGE | INTRAVENOUS | Status: AC
  Filled 2016-04-05: qty 10

## 2016-04-05 MED ORDER — SODIUM CHLORIDE 0.9 % IV SOLN
INTRAVENOUS | Status: DC | PRN
  Administered 2016-04-05: 10 mL

## 2016-04-05 MED ORDER — ONDANSETRON 4 MG PO TBDP: 4.0000 mg | ORAL_TABLET | Freq: Four times a day (QID) | ORAL | Status: DC | PRN

## 2016-04-05 SURGICAL SUPPLY — 42 items
APPLIER CLIP 5 13 M/L LIGAMAX5 (MISCELLANEOUS) ×3
APR CLP MED LRG 5 ANG JAW (MISCELLANEOUS) ×1
BAG SPEC RTRVL LRG 6X4 10 (ENDOMECHANICALS) ×1
BLADE SURG ROTATE 9660 (MISCELLANEOUS) IMPLANT
CANISTER SUCTION 2500CC (MISCELLANEOUS) ×3 IMPLANT
CATH REDDICK CHOLANGI 4FR 50CM (CATHETERS) ×3 IMPLANT
CHLORAPREP W/TINT 26ML (MISCELLANEOUS) ×3 IMPLANT
CLIP APPLIE 5 13 M/L LIGAMAX5 (MISCELLANEOUS) ×1 IMPLANT
COVER MAYO STAND STRL (DRAPES) ×3 IMPLANT
COVER SURGICAL LIGHT HANDLE (MISCELLANEOUS) ×3 IMPLANT
DRAPE C-ARM 42X72 X-RAY (DRAPES) ×3 IMPLANT
ELECT REM PT RETURN 9FT ADLT (ELECTROSURGICAL) ×3
ELECTRODE REM PT RTRN 9FT ADLT (ELECTROSURGICAL) ×1 IMPLANT
GLOVE BIO SURGEON STRL SZ7.5 (GLOVE) ×3 IMPLANT
GLOVE BIOGEL PI IND STRL 7.0 (GLOVE) IMPLANT
GLOVE BIOGEL PI INDICATOR 7.0 (GLOVE) ×4
GLOVE BIOGEL PI ORTHO PRO SZ8 (GLOVE) ×2
GLOVE PI ORTHO PRO STRL SZ8 (GLOVE) IMPLANT
GLOVE SURG SS PI 7.0 STRL IVOR (GLOVE) ×4 IMPLANT
GLOVE SURG SS PI 8.0 STRL IVOR (GLOVE) ×2 IMPLANT
GOWN STRL REUS W/ TWL LRG LVL3 (GOWN DISPOSABLE) ×3 IMPLANT
GOWN STRL REUS W/ TWL XL LVL3 (GOWN DISPOSABLE) IMPLANT
GOWN STRL REUS W/TWL LRG LVL3 (GOWN DISPOSABLE) ×9
GOWN STRL REUS W/TWL XL LVL3 (GOWN DISPOSABLE) ×3
IV CATH 14GX2 1/4 (CATHETERS) ×3 IMPLANT
KIT BASIN OR (CUSTOM PROCEDURE TRAY) ×3 IMPLANT
KIT ROOM TURNOVER OR (KITS) ×3 IMPLANT
LIQUID BAND (GAUZE/BANDAGES/DRESSINGS) ×3 IMPLANT
NS IRRIG 1000ML POUR BTL (IV SOLUTION) ×3 IMPLANT
PAD ARMBOARD 7.5X6 YLW CONV (MISCELLANEOUS) ×3 IMPLANT
POUCH SPECIMEN RETRIEVAL 10MM (ENDOMECHANICALS) ×3 IMPLANT
SCISSORS LAP 5X35 DISP (ENDOMECHANICALS) ×3 IMPLANT
SET IRRIG TUBING LAPAROSCOPIC (IRRIGATION / IRRIGATOR) ×3 IMPLANT
SLEEVE ENDOPATH XCEL 5M (ENDOMECHANICALS) ×6 IMPLANT
SPECIMEN JAR SMALL (MISCELLANEOUS) ×3 IMPLANT
SUT MNCRL AB 4-0 PS2 18 (SUTURE) ×3 IMPLANT
TOWEL OR 17X24 6PK STRL BLUE (TOWEL DISPOSABLE) ×3 IMPLANT
TOWEL OR 17X26 10 PK STRL BLUE (TOWEL DISPOSABLE) ×3 IMPLANT
TRAY LAPAROSCOPIC MC (CUSTOM PROCEDURE TRAY) ×3 IMPLANT
TROCAR XCEL BLUNT TIP 100MML (ENDOMECHANICALS) ×3 IMPLANT
TROCAR XCEL NON-BLD 5MMX100MML (ENDOMECHANICALS) ×3 IMPLANT
TUBING INSUFFLATION (TUBING) ×3 IMPLANT

## 2016-04-05 NOTE — Op Note (Signed)
04/05/2016  10:10 AM  PATIENT:  Sheri Robinson  35 y.o. female  PRE-OPERATIVE DIAGNOSIS:  gallstones  POST-OPERATIVE DIAGNOSIS:  gallstones  PROCEDURE:  Procedure(s): LAPAROSCOPIC CHOLECYSTECTOMY WITH INTRAOPERATIVE CHOLANGIOGRAM (N/A)  SURGEON:  Surgeon(s) and Role:    * Jovita Kussmaul, MD - Primary  PHYSICIAN ASSISTANT:   ASSISTANTS: Jamie Sipsis, RNFA   ANESTHESIA:   general  EBL:  Total I/O In: -  Out: 20 [Blood:20]  BLOOD ADMINISTERED:none  DRAINS: none   LOCAL MEDICATIONS USED:  MARCAINE     SPECIMEN:  Source of Specimen:  gallbladder  DISPOSITION OF SPECIMEN:  PATHOLOGY  COUNTS:  YES  TOURNIQUET:  * No tourniquets in log *  DICTATION: .Dragon Dictation   Procedure: After informed consent was obtained the patient was brought to the operating room and placed in the supine position on the operating room table. After adequate induction of general anesthesia the patient's abdomen was prepped with ChloraPrep allowed to dry and draped in usual sterile manner. An appropriate timeout was performed. The area below the umbilicus was infiltrated with quarter percent  Marcaine. A small incision was made with a 15 blade knife. The incision was carried down through the subcutaneous tissue bluntly with a hemostat and [employer] retractors. The linea alba was identified. The linea alba was incised with a 15 blade knife and each side was grasped with Coker clamps. The preperitoneal space was then probed with a hemostat until the peritoneum was opened and access was gained to the abdominal cavity. A 0 Vicryl pursestring stitch was placed in the fascia surrounding the opening. A Hassan cannula was then placed through the opening and anchored in place with the previously placed Vicryl purse string stitch. The abdomen was insufflated with carbon dioxide without difficulty. A laparoscope was inserted through the [facility] cannula in the right upper quadrant was inspected. Next the epigastric  region was infiltrated with % Marcaine. A small incision was made with a 15 blade knife. A 5 mm port was placed bluntly through this incision into the abdominal cavity under direct vision. Next 2 sites were chosen laterally on the right side of the abdomen for placement of 5 mm ports. Each of these areas was infiltrated with quarter percent Marcaine. Small stab incisions were made with a 15 blade knife. 5 mm ports were then placed bluntly through these incisions into the abdominal cavity under direct vision without difficulty. A blunt grasper was placed through the lateralmost 5 mm port and used to grasp the dome of the gallbladder and elevated anteriorly and superiorly. There were some adhesions to the body of the gallbladder that were taken down bluntly.  Another blunt grasper was placed through the other 5 mm port and used to retract the body and neck of the gallbladder. A dissector was placed through the epigastric port and using the electrocautery the peritoneal reflection at the gallbladder neck was opened. Blunt dissection was then carried out in this area until the gallbladder neck-cystic duct junction was readily identified and a good window was created. A single clip was placed on the gallbladder neck. A small  ductotomy was made just below the clip with laparoscopic scissors. A 14-gauge Angiocath was then placed through the anterior abdominal wall under direct vision. A Reddick cholangiogram catheter was then placed through the Angiocath and flushed. The catheter was then placed in the cystic duct and anchored in place with a clip. A cholangiogram was obtained that showed no filling defects good emptying into the duodenum an  adequate length on the cystic duct. The anchoring clip and catheters were then removed from the patient. 3 clips were placed proximally on the cystic duct and the duct was divided between the 2 sets of clips. Posterior to this the cystic artery was identified and again dissected  bluntly in a circumferential manner until a good window  was created. 2 clips were placed proximally and one distally on the artery and the artery was divided between the 2 sets of clips. Next a laparoscopic hook cautery device was used to separate the gallbladder from the liver bed. Prior to completely detaching the gallbladder from the liver bed the liver bed was inspected and several small bleeding points were coagulated with the electrocautery until the area was completely hemostatic. The gallbladder was then detached the rest of it from the liver bed without difficulty. A laparoscopic bag was inserted through the hassan port. The laparoscope was moved to the epigastric port. The gallbladder was placed within the bag and the bag was sealed.  The bag with the gallbladder was then removed with the [facility] cannula through the infraumbilical port without difficulty. The fascial defect was then closed with the previously placed Vicryl pursestring stitch as well as with another figure-of-eight 0 Vicryl stitch. The liver bed was inspected again and found to be hemostatic. There was an adhesion of small bowel to the abdominal wall just to the left of the hassan canula but it was inspected and no injury was seen. The abdomen was irrigated with copious amounts of saline until the effluent was clear. The ports were then removed under direct vision without difficulty and were found to be hemostatic. The gas was allowed to escape. The skin incisions were all closed with interrupted 4-0 Monocryl subcuticular stitches. Dermabond dressings were applied. The patient tolerated the procedure well. At the end of the case all needle sponge and instrument counts were correct. The patient was then awakened and taken to recovery in stable condition  PLAN OF CARE: Admit for overnight observation  PATIENT DISPOSITION:  PACU - hemodynamically stable.   Delay start of Pharmacological VTE agent (>24hrs) due to surgical blood loss or  risk of bleeding: no

## 2016-04-05 NOTE — H&P (Signed)
Sheri Robinson  Location: [facility] Surgery Patient #: [id number] DOB: Jan 25, 1981 Single / Language: Sheri Robinson / Race: Black or African American Female   History of Present Illness The patient is a 35 year old female who presents with abdominal pain. We are asked to see the patient in consultation by Dr. Jaynee Eagles to evaluate her for gallstones. The patient is a 35 year old black female who recently had an episode of severe right upper quadrant pain. She was evaluated with ultrasound which did show some small stones in the gallbladder and mild dilation of the common bile duct. Her SGOT and SGPT were slightly elevated. The pain has since resolved. In retrospect she feels as though she has had several episodes of similar pain but not as severe over the last 6 months. She does have nausea all the time now. She also has problems with constipation. 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 She had a recent lower extremity duplex that showed no evidence of clots by her report.   Other Problems  Anxiety Disorder Arthritis Asthma Back Pain Gastroesophageal Reflux Disease Heart murmur High blood pressure Migraine Headache Pulmonary Embolism / Blood Clot in Legs Transfusion history  Diagnostic Studies History Colonoscopy never Mammogram never Pap Smear 1-5 years ago  Allergies  Amoxicillin ER *PENICILLINS* Penicillin V *PENICILLINS*  Medication History AmLODIPine Besylate (10MG  Tablet, Oral) Active. Chlorthalidone (25MG  Tablet, Oral) Active. Linzess (145MCG Capsule, Oral) Active. Omeprazole (40MG  Capsule DR, Oral) Active. Ondansetron (4MG  Tablet Disint, Oral) Active. Cyanocobalamin (500MCG Tablet, Oral) Active. Triamcinolone Acetonide (55MCG/ACT Inhaler, Nasal) Active. Potassium Chloride (20MEQ Tablet ER, Oral) Active. Medications Reconciled  Social History  Caffeine use Coffee, Tea. No alcohol use No drug  use Tobacco use Never smoker.  Family History  Breast Cancer Family Members In General. Cerebrovascular Accident Father. Diabetes Mellitus Mother. Hypertension Mother. Kidney Disease Mother.  Pregnancy / Birth History  Age at menarche 40 years. Gravida 1 Maternal age 32-35 Para 1 Regular periods    Review of Systems  General Present- Appetite Loss and Fatigue. Not Present- Chills, Fever, Night Sweats, Weight Gain and Weight Loss. Skin Present- Dryness. Not Present- Change in Wart/Mole, Hives, Jaundice, New Lesions, Non-Healing Wounds, Rash and Ulcer. HEENT Present- Wears glasses/contact lenses. Not Present- Earache, Hearing Loss, Hoarseness, Nose Bleed, Oral Ulcers, Ringing in the Ears, Seasonal Allergies, Sinus Pain, Sore Throat, Visual Disturbances and Yellow Eyes. Respiratory Not Present- Bloody sputum, Chronic Cough, Difficulty Breathing, Snoring and Wheezing. Breast Not Present- Breast Mass, Breast Pain, Nipple Discharge and Skin Changes. Cardiovascular Not Present- Chest Pain, Difficulty Breathing Lying Down, Leg Cramps, Palpitations, Rapid Heart Rate, Shortness of Breath and Swelling of Extremities. Gastrointestinal Present- Bloating, Change in Bowel Habits and Nausea. Not Present- Abdominal Pain, Bloody Stool, Chronic diarrhea, Constipation, Difficulty Swallowing, Excessive gas, Gets full quickly at meals, Hemorrhoids, Indigestion, Rectal Pain and Vomiting. Female Genitourinary Present- Nocturia. Not Present- Frequency, Painful Urination, Pelvic Pain and Urgency. Musculoskeletal Present- Joint Stiffness. Not Present- Back Pain, Joint Pain, Muscle Pain, Muscle Weakness and Swelling of Extremities. Neurological Present- Headaches. Not Present- Decreased Memory, Fainting, Numbness, Seizures, Tingling, Tremor, Trouble walking and Weakness. Psychiatric Not Present- Anxiety, Bipolar, Change in Sleep Pattern, Depression, Fearful and Frequent crying. Endocrine Not Present-  Cold Intolerance, Excessive Hunger, Hair Changes, Heat Intolerance, Hot flashes and New Diabetes. Hematology Not Present- Blood Thinners, Easy Bruising, Excessive bleeding, Gland problems, HIV and Persistent Infections.  Vitals  Weight: 261 lb Height: 68in Body Surface Area: 2.29  m Body Mass Index: 39.68 kg/m  Temp.: 34F(Temporal)  Pulse: 73 (Regular)  Resp.: 16 (Unlabored)  BP: 132/74 (Sitting, Left Arm, Standard)       Physical Exam  General Mental Status-Alert. General Appearance-Consistent with stated age. Hydration-Well hydrated. Voice-Normal.  Head and Neck Head-normocephalic, atraumatic with no lesions or palpable masses. Trachea-midline. Thyroid Gland Characteristics - normal size and consistency.  Eye Eyeball - Bilateral-Extraocular movements intact. Sclera/Conjunctiva - Bilateral-No scleral icterus.  Chest and Lung Exam Chest and lung exam reveals -quiet, even and easy respiratory effort with no use of accessory muscles and on auscultation, normal breath sounds, no adventitious sounds and normal vocal resonance. Inspection Chest Wall - Normal. Back - normal.  Cardiovascular Cardiovascular examination reveals -normal heart sounds, regular rate and rhythm with no murmurs and normal pedal pulses bilaterally.  Abdomen Note: The abdomen is soft with mild right upper quadrant tenderness. There is no palpable mass. There is a well-healed lower midline scar.   Neurologic Neurologic evaluation reveals -alert and oriented x 3 with no impairment of recent or remote memory. Mental Status-Normal.  Musculoskeletal Normal Exam - Left-Upper Extremity Strength Normal and Lower Extremity Strength Normal. Normal Exam - Right-Upper Extremity Strength Normal and Lower Extremity Strength Normal.  Lymphatic Head & Neck  General Head & Neck Lymphatics: Bilateral - Description - Normal. Axillary  General Axillary Region:  Bilateral - Description - Normal. Tenderness - Non Tender. Femoral & Inguinal  Generalized Femoral & Inguinal Lymphatics: Bilateral - Description - Normal. Tenderness - Non Tender.    Assessment & Plan GALLSTONES (K80.20) Impression: The patient appears to have symptomatic gallstones. Because of the risk of further painful episodes and possible pancreatitis I think she would benefit from having her gallbladder removed. She would also like to have this done. I have discussed with her in detail the risks and benefits of the operation to remove the gallbladder as well as some of the technical aspects and she understands and wishes to proceed. Current Plans Pt Education - Gallstones: discussed with patient and provided information.

## 2016-04-05 NOTE — Anesthesia Procedure Notes (Signed)
Procedure Name: Intubation Date/Time: 04/05/2016 8:40 AM Performed by: Salli Quarry Tyshika Baldridge Pre-anesthesia Checklist: Patient identified, Emergency Drugs available, Suction available and Patient being monitored Patient Re-evaluated:Patient Re-evaluated prior to inductionOxygen Delivery Method: Circle System Utilized Preoxygenation: Pre-oxygenation with 100% oxygen Intubation Type: IV induction Ventilation: Mask ventilation without difficulty Laryngoscope Size: Mac and 3 Grade View: Grade I Tube type: Oral Tube size: 7.0 mm Number of attempts: 1 Airway Equipment and Method: Stylet Placement Confirmation: ETT inserted through vocal cords under direct vision,  positive ETCO2 and breath sounds checked- equal and bilateral Secured at: 22 cm Tube secured with: Tape Dental Injury: Teeth and Oropharynx as per pre-operative assessment

## 2016-04-05 NOTE — Anesthesia Postprocedure Evaluation (Signed)
Anesthesia Post Note  Patient: Sheri Robinson  Procedure(s) Performed: Procedure(s) (LRB): LAPAROSCOPIC CHOLECYSTECTOMY WITH INTRAOPERATIVE CHOLANGIOGRAM (N/A)  Patient location during evaluation: PACU Anesthesia Type: General Level of consciousness: awake and alert Pain management: pain level controlled Vital Signs Assessment: post-procedure vital signs reviewed and stable Respiratory status: spontaneous breathing, nonlabored ventilation, respiratory function stable and patient connected to nasal cannula oxygen Cardiovascular status: blood pressure returned to baseline and stable Postop Assessment: no signs of nausea or vomiting Anesthetic complications: no    Last Vitals:  Vitals:   04/05/16 1154 04/05/16 1211  BP:    Pulse:  (P) 80  Resp:  (P) 16  Temp: 36.5 C (P) 36.7 C    Last Pain:  Vitals:   04/05/16 1211  TempSrc: (P) Oral  PainSc:                  [name]

## 2016-04-05 NOTE — Transfer of Care (Signed)
Immediate Anesthesia Transfer of Care Note  Patient: Sheri Robinson  Procedure(s) Performed: Procedure(s): LAPAROSCOPIC CHOLECYSTECTOMY WITH INTRAOPERATIVE CHOLANGIOGRAM (N/A)  Patient Location: PACU  Anesthesia Type:General  Level of Consciousness: awake, alert  and patient cooperative  Airway & Oxygen Therapy: Patient Spontanous Breathing and Patient connected to nasal cannula oxygen  Post-op Assessment: Report given to RN and Post -op Vital signs reviewed and stable  Post vital signs: Reviewed and stable  Last Vitals:  Vitals:   04/05/16 0654  BP: 130/74  Pulse: 90  Resp: 20  Temp: 36.7 C    Last Pain:  Vitals:   04/05/16 0654  TempSrc: Oral         Complications: No apparent anesthesia complications

## 2016-04-05 NOTE — Interval H&P Note (Signed)
History and Physical Interval Note:  04/05/2016 8:05 AM  Sheri Robinson  has presented today for surgery, with the diagnosis of gallstones  The various methods of treatment have been discussed with the patient and family. After consideration of risks, benefits and other options for treatment, the patient has consented to  Procedure(s): LAPAROSCOPIC CHOLECYSTECTOMY WITH INTRAOPERATIVE CHOLANGIOGRAM (N/A) as a surgical intervention .  The patient's history has been reviewed, patient examined, no change in status, stable for surgery.  I have reviewed the patient's chart and labs.  Questions were answered to the patient's satisfaction.     TOTH III,Alyxandra Tenbrink S

## 2016-04-06 ENCOUNTER — Encounter (HOSPITAL_COMMUNITY): Payer: Self-pay | Admitting: General Surgery

## 2016-04-06 DIAGNOSIS — K8010 Calculus of gallbladder with chronic cholecystitis without obstruction: Secondary | ICD-10-CM | POA: Diagnosis not present

## 2016-04-06 MED ORDER — HYDROCODONE-ACETAMINOPHEN 5-325 MG PO TABS: 1.0000 | ORAL_TABLET | ORAL | 0 refills | Status: DC | PRN

## 2016-04-06 NOTE — Discharge Summary (Signed)
Physician Discharge Summary  Patient ID: Sheri Robinson MRN: [id number] DOB/AGE: [birth year] 35 y.o.  Admit date: 04/05/2016 Discharge date: 04/06/2016  Admission Diagnoses:  Discharge Diagnoses:  Active Problems:   Gallstones   Discharged Condition: good  Hospital Course: the patient underwent lap chole. She did not have any family at home so she stayed overnight. She initially had some nausea and pain but this has improved overnight and she is ready for discharge home  Consults: None  Significant Diagnostic Studies: none  Treatments: surgery: as above  Discharge Exam: Blood pressure 109/68, pulse 80, temperature 98.3 F (36.8 C), temperature source Oral, resp. rate 20, height 5\' 8"  (1.727 m), weight 118.4 kg (261 lb), last menstrual period 03/19/2016, SpO2 95 %. Resp: clear to auscultation bilaterally Cardio: regular rate and rhythm GI: soft, minimal tenderness  Disposition: 01-Home or Self Care  Discharge Instructions    Call MD for:  difficulty breathing, headache or visual disturbances    Complete by:  As directed    Call MD for:  extreme fatigue    Complete by:  As directed    Call MD for:  hives    Complete by:  As directed    Call MD for:  persistant dizziness or light-headedness    Complete by:  As directed    Call MD for:  persistant nausea and vomiting    Complete by:  As directed    Call MD for:  redness, tenderness, or signs of infection (pain, swelling, redness, odor or green/yellow discharge around incision site)    Complete by:  As directed    Call MD for:  severe uncontrolled pain    Complete by:  As directed    Call MD for:  temperature >100.4    Complete by:  As directed    Diet - low sodium heart healthy    Complete by:  As directed    Discharge instructions    Complete by:  As directed    May shower. No heavy lifting. Low fat diet   Increase activity slowly    Complete by:  As directed    No wound care    Complete by:  As directed        Medication List    TAKE these medications   albuterol 108 (90 Base) MCG/ACT inhaler Commonly known as:  PROVENTIL HFA;VENTOLIN HFA Inhale 2 puffs into the lungs every 6 (six) hours as needed for wheezing or shortness of breath (last used monthe ago).   amLODipine 10 MG tablet Commonly known as:  NORVASC Take 1 tablet (10 mg total) by mouth daily.   Blood Pressure Cuff Misc 1 Device by Does not apply route daily.   Cetirizine HCl 10 MG Caps Take 1 capsule (10 mg total) by mouth daily.   cholecalciferol 1000 units tablet Commonly known as:  VITAMIN D Take 2,000 Units by mouth daily.   HYDROcodone-acetaminophen 5-325 MG tablet Commonly known as:  NORCO/VICODIN Take 1-2 tablets by mouth every 4 (four) hours as needed for moderate pain.   linaclotide 145 MCG Caps capsule Commonly known as:  LINZESS Take 145 mcg by mouth daily before breakfast.   lisinopril 10 MG tablet Commonly known as:  PRINIVIL,ZESTRIL Take 1 tablet (10 mg total) by mouth daily.   multivitamin with minerals tablet Take 1 tablet by mouth daily.   omeprazole 40 MG capsule Commonly known as:  PRILOSEC Take 40 mg by mouth daily.   ondansetron 4 MG disintegrating tablet Commonly known as:  ZOFRAN ODT Take 1 tablet (4 mg total) by mouth every 8 (eight) hours as needed for nausea or vomiting.   potassium chloride SA 20 MEQ tablet Commonly known as:  K-DUR,KLOR-CON Take 1 tablet (20 mEq total) by mouth 2 (two) times daily. What changed:  when to take this   triamcinolone 55 MCG/ACT Aero nasal inhaler Commonly known as:  NASACORT Place 2 sprays into the nose daily. What changed:  when to take this  reasons to take this      Follow-up Information    TOTH Robinson,Sheri Neyra S, MD Follow up in 2 week(s).   Specialty:  General Surgery Contact information: [address] [place] [address] [phone]           Signed: [name] 04/06/2016, 9:24 AM

## 2016-04-06 NOTE — Progress Notes (Signed)
1 Day Post-Op  Subjective: Notes some soreness but otherwise ok. Nausea has resolved  Objective: Vital signs in last 24 hours: Temp:  [97.3 F (36.3 C)-98.6 F (37 C)] 98.3 F (36.8 C) (10/12 0858) Pulse Rate:  [78-93] 80 (10/12 0858) Resp:  [12-33] 20 (10/12 0858) BP: (101-113)/(53-75) 109/68 (10/12 0858) SpO2:  [92 %-100 %] 95 % (10/12 0858) Last BM Date: 04/04/16 (prior to admission)  Intake/Output from previous day: 10/11 0701 - 10/12 0700 In: 2285.8 [P.O.:370; I.V.:1915.8] Out: 20 [Blood:20] Intake/Output this shift: No intake/output data recorded.  Resp: clear to auscultation bilaterally Cardio: regular rate and rhythm GI: soft, minimal tenderness  Lab Results:   Recent Labs  04/04/16 0851  WBC 6.9  HGB 10.7*  HCT 32.8*  PLT 243   BMET  Recent Labs  04/04/16 0851  NA 138  K 3.4*  CL 106  CO2 24  GLUCOSE 107*  BUN 6  CREATININE 0.97  CALCIUM 9.0   PT/INR No results for input(s): LABPROT, INR in the last 72 hours. ABG No results for input(s): PHART, HCO3 in the last 72 hours.  Invalid input(s): PCO2, PO2  Studies/Results: Dg Cholangiogram Operative  Result Date: 04/05/2016 CLINICAL DATA:  Intraoperative cholangiogram during laparoscopic cholecystectomy. EXAM: INTRAOPERATIVE CHOLANGIOGRAM FLUOROSCOPY TIME:  18 seconds COMPARISON:  Right upper quadrant abdominal ultrasound - 03/22/2016 FINDINGS: Intraoperative cholangiographic images of the right upper abdominal quadrant during laparoscopic cholecystectomy are provided for review. Surgical clips overlie the expected location of the gallbladder fossa. Contrast injection demonstrates selective cannulation of the central aspect of the cystic duct. There is passage of contrast through the central aspect of the cystic duct with filling of a mildly dilated common bile duct. There is passage of contrast though the CBD and into the descending portion of the duodenum. There is minimal reflux of injected contrast  into the common hepatic duct and central aspect of the non dilated intrahepatic biliary system. There are no discrete filling defects within the opacified portions of the biliary system to suggest the presence of choledocholithiasis. IMPRESSION: Mild dilatation of the common bile duct without evidence of choledocholithiasis. Electronically Signed   By: Sandi Mariscal M.D.   On: 04/05/2016 12:01    Anti-infectives: Anti-infectives    Start     Dose/Rate Route Frequency Ordered Stop   04/05/16 0800  ciprofloxacin (CIPRO) IVPB 400 mg     400 mg 200 mL/hr over 60 Minutes Intravenous To [facility]Stay Surgical 04/04/16 1426 04/05/16 0842      Assessment/Plan: s/p Procedure(s): LAPAROSCOPIC CHOLECYSTECTOMY WITH INTRAOPERATIVE CHOLANGIOGRAM (N/A) Advance diet Discharge  LOS: 0 days    TOTH III,Tristy Udovich S 04/06/2016

## 2016-04-06 NOTE — Progress Notes (Signed)
Patient discharged to home with instructions and prescription. 

## 2016-04-07 NOTE — Telephone Encounter (Signed)
Patient notified

## 2016-04-23 ENCOUNTER — Other Ambulatory Visit: Payer: Self-pay | Admitting: Physician Assistant

## 2016-04-23 DIAGNOSIS — I10 Essential (primary) hypertension: Secondary | ICD-10-CM

## 2016-05-19 ENCOUNTER — Other Ambulatory Visit: Payer: Self-pay | Admitting: Physician Assistant

## 2016-05-19 DIAGNOSIS — I10 Essential (primary) hypertension: Secondary | ICD-10-CM

## 2016-05-29 ENCOUNTER — Ambulatory Visit (INDEPENDENT_AMBULATORY_CARE_PROVIDER_SITE_OTHER): Payer: BLUE CROSS/BLUE SHIELD | Admitting: Physician Assistant

## 2016-05-29 ENCOUNTER — Other Ambulatory Visit: Payer: Self-pay | Admitting: Physician Assistant

## 2016-05-29 VITALS — BP 128/82 | HR 97 | Temp 98.5°F | Resp 17 | Ht 68.0 in | Wt 252.0 lb

## 2016-05-29 DIAGNOSIS — R229 Localized swelling, mass and lump, unspecified: Secondary | ICD-10-CM | POA: Diagnosis not present

## 2016-05-29 DIAGNOSIS — J069 Acute upper respiratory infection, unspecified: Secondary | ICD-10-CM

## 2016-05-29 DIAGNOSIS — J029 Acute pharyngitis, unspecified: Secondary | ICD-10-CM | POA: Diagnosis not present

## 2016-05-29 DIAGNOSIS — IMO0002 Reserved for concepts with insufficient information to code with codable children: Secondary | ICD-10-CM

## 2016-05-29 LAB — POCT RAPID STREP A (OFFICE): RAPID STREP A SCREEN: NEGATIVE

## 2016-05-29 MED ORDER — FLUTICASONE PROPIONATE 50 MCG/ACT NA SUSP: 2.0000 | Freq: Every day | NASAL | 0 refills | Status: DC

## 2016-05-29 NOTE — Progress Notes (Signed)
MRN: [id number] DOB: 11/03/1980  Subjective:   Sheri Robinson is a 35 y.o. female presenting for chief complaint of Sinus Problem (Sore throat, since saturday ) and Mass (On left side. INFORMED PT OF ONE COMPLAINT PER VISIT POLICY ) .  Reports 3 day history of sinus headache, rhinorrhea, itchy watery eyes, sore throat, difficulty swallowing and voice change, fever (99.9) and night sweats. Has tried sudaphed and cold and flu OTC with moderate relief. Deniesear pain, dry cough (no), wheezing, shortness of breath, chest tightness, chest pain and myalgia, chills, fatigue, malaise, nausea, vomiting, abdominal pain and diarrhea. Has had sick contact with people at a party. Notes she got her cup mixed with someone who was sick at the party and after drinking out of it the person mentioned she was on a zpack.  Has history of seasonal allergies,  No history of asthma. Patient has had flu shot this season. Denies smoking.   Pt also mentions she has a mass on the left side of her abdomen. Notes she noticed it last week when her tag on her shirt caused an itch in that area. States there is minimal tenderness. Denies change in size, purulent discharge, swelling, or warmth from the region.    Yaeli has a current medication list which includes the following prescription(s): albuterol, amlodipine, blood pressure cuff, cetirizine hcl, cholecalciferol, hydrocodone-acetaminophen, linaclotide, lisinopril, multivitamin with minerals, omeprazole, ondansetron, and triamcinolone. Also is allergic to amoxicillin and penicillins.  Julieann  has a past medical history of Anemia; Arthritis; Asthma; DVT (deep venous thrombosis) ([place]); GERD (gastroesophageal reflux disease); Heart murmur; Hypertension; Miscarriage; Miscarriage (2013); Peripheral vascular disease ([place]) (2014); Pneumonia; and Pulmonary embolism ([place]). Also  has a past surgical history that includes Myomectomy; IVC FILTER PLACEMENT ([place] HX) (2014); and  Cholecystectomy (N/A, 04/05/2016).   Objective:   Vitals: BP 128/82 (BP Location: Right Arm, Patient Position: Sitting, Cuff Size: Large)   Pulse 97   Temp 98.5 F (36.9 C) (Oral)   Resp 17   Ht 5\' 8"  (1.727 m)   Wt 252 lb (114.3 kg)   LMP 05/15/2016 (Approximate)   SpO2 98%   BMI 38.32 kg/m   Physical Exam  Constitutional: She is oriented to person, place, and time. She appears well-developed and well-nourished.  HENT:  Head: Normocephalic and atraumatic.  Nose: Mucosal edema and rhinorrhea present. Right sinus exhibits maxillary sinus tenderness. Right sinus exhibits no frontal sinus tenderness. Left sinus exhibits maxillary sinus tenderness. Left sinus exhibits no frontal sinus tenderness.  Mouth/Throat: Uvula is midline and mucous membranes are normal. Posterior oropharyngeal erythema ( consistenet with postnasal drip) present. Tonsillar exudate ( one).  Eyes: Conjunctivae are normal.  Neck: Normal range of motion.  Pulmonary/Chest: Effort normal.  Neurological: She is alert and oriented to person, place, and time.  Skin: Skin is warm and dry.     Psychiatric: She has a normal mood and affect.  Vitals reviewed.     Results for orders placed or performed in visit on 05/29/16 (from the past 24 hour(s))  POCT rapid strep A     Status: None   Collection Time: 05/29/16  2:43 PM  Result Value Ref Range   Rapid Strep A Screen Negative Negative     Wt Readings from Last 3 Encounters:  05/29/16 252 lb (114.3 kg)  04/05/16 261 lb (118.4 kg)  04/04/16 261 lb 3.2 oz (118.5 kg)    Assessment and Plan :  1. Sore throat - POCT rapid strep A -  Culture, Group A Strep  2. Acute upper respiratory infection -Will treat symptomatically - fluticasone (FLONASE) 50 MCG/ACT nasal spray; Place 2 sprays into both nostrils daily.  Dispense: 16 g; Refill: 0 -Return to clinic if symptoms worsen, do not improve in 7 days, or as needed  3. Mass Await results  - [place] Chest;  Future  [name], PA-C  [facility] 05/29/2016 2:45 PM

## 2016-05-29 NOTE — Patient Instructions (Addendum)
-   We will treat this as a respiratory viral infection.  - I recommend you rest, drink plenty of fluids, eat light meals including soups.  - You may also use Tylenol or ibuprofen over-the-counter for your sore throat.  - You may use flonase for your nasal congestion. - Please let me know if you are not seeing any improvement or get worse in 7 days. [place] 06/02/16 [place] 145PM  IF you received an x-ray today, you will receive an invoice from [facility]. Please contact [facility] at [phone] with questions or concerns regarding your invoice.   IF you received labwork today, you will receive an invoice from [employer]. Please contact [facility] at [phone] with questions or concerns regarding your invoice.   Our billing staff will not be able to assist you with questions regarding bills from these companies.  You will be contacted with the lab results as soon as they are available. The fastest way to get your results is to activate your My Chart account. Instructions are located on the last page of this paperwork. If you have not heard from [place] regarding the results in 2 weeks, please contact this office.

## 2016-06-01 LAB — CULTURE, GROUP A STREP: STREP A CULTURE: NEGATIVE

## 2016-06-02 ENCOUNTER — Ambulatory Visit (HOSPITAL_COMMUNITY)
Admission: RE | Admit: 2016-06-02 | Discharge: 2016-06-02 | Disposition: A | Discharge status: Home / Self Care | Payer: BLUE CROSS/BLUE SHIELD | Source: Ambulatory Visit | Attending: Physician Assistant | Admitting: Physician Assistant

## 2016-06-02 DIAGNOSIS — IMO0002 Reserved for concepts with insufficient information to code with codable children: Secondary | ICD-10-CM

## 2016-06-02 DIAGNOSIS — R229 Localized swelling, mass and lump, unspecified: Secondary | ICD-10-CM

## 2016-06-02 DIAGNOSIS — R222 Localized swelling, mass and lump, trunk: Secondary | ICD-10-CM | POA: Diagnosis present

## 2016-06-05 ENCOUNTER — Other Ambulatory Visit: Payer: Self-pay | Admitting: Urgent Care

## 2016-06-05 DIAGNOSIS — R222 Localized swelling, mass and lump, trunk: Secondary | ICD-10-CM

## 2016-06-07 ENCOUNTER — Encounter: Payer: Self-pay | Admitting: Radiology

## 2016-06-13 ENCOUNTER — Ambulatory Visit (INDEPENDENT_AMBULATORY_CARE_PROVIDER_SITE_OTHER): Payer: BLUE CROSS/BLUE SHIELD | Admitting: Physician Assistant

## 2016-06-13 VITALS — BP 124/80 | HR 97 | Temp 98.6°F | Resp 17 | Ht 68.0 in | Wt 250.0 lb

## 2016-06-13 DIAGNOSIS — R229 Localized swelling, mass and lump, unspecified: Secondary | ICD-10-CM | POA: Diagnosis not present

## 2016-06-13 DIAGNOSIS — L989 Disorder of the skin and subcutaneous tissue, unspecified: Secondary | ICD-10-CM | POA: Diagnosis not present

## 2016-06-13 DIAGNOSIS — N390 Urinary tract infection, site not specified: Secondary | ICD-10-CM | POA: Diagnosis not present

## 2016-06-13 DIAGNOSIS — R102 Pelvic and perineal pain: Secondary | ICD-10-CM

## 2016-06-13 DIAGNOSIS — IMO0002 Reserved for concepts with insufficient information to code with codable children: Secondary | ICD-10-CM

## 2016-06-13 LAB — POCT URINALYSIS DIP (MANUAL ENTRY)
BILIRUBIN UA: NEGATIVE
BILIRUBIN UA: NEGATIVE
Glucose, UA: NEGATIVE
NITRITE UA: NEGATIVE
PH UA: 5.5
Protein Ur, POC: 30 — AB
Spec Grav, UA: 1.025
Urobilinogen, UA: 0.2

## 2016-06-13 LAB — POC MICROSCOPIC URINALYSIS (UMFC): Mucus: ABSENT

## 2016-06-13 LAB — POCT WET + KOH PREP
TRICH BY WET PREP: ABSENT
Yeast by KOH: ABSENT
Yeast by wet prep: ABSENT

## 2016-06-13 LAB — POCT URINE PREGNANCY: PREG TEST UR: NEGATIVE

## 2016-06-13 MED ORDER — NITROFURANTOIN MONOHYD MACRO 100 MG PO CAPS: 100.0000 mg | ORAL_CAPSULE | Freq: Two times a day (BID) | ORAL | 0 refills | Status: AC

## 2016-06-13 NOTE — Patient Instructions (Addendum)
  Take antibiotic as prescribed.  -Return to clinic if symptoms worsen, do not improve after one week, or as needed   Urinary Tract Infection, Adult Introduction A urinary tract infection (UTI) is an infection of any part of the urinary tract. The urinary tract includes the:  Kidneys.  Ureters.  Bladder.  Urethra. These organs make, store, and get rid of pee (urine) in the body. Follow these instructions at home:  Take over-the-counter and prescription medicines only as told by your doctor.  If you were prescribed an antibiotic medicine, take it as told by your doctor. Do not stop taking the antibiotic even if you start to feel better.  Avoid the following drinks:  Alcohol.  Caffeine.  Tea.  Carbonated drinks.  Drink enough fluid to keep your pee clear or pale yellow.  Keep all follow-up visits as told by your doctor. This is important.  Make sure to:  Empty your bladder often and completely. Do not to hold pee for long periods of time.  Empty your bladder before and after sex.  Wipe from front to back after a bowel movement if you are female. Use each tissue one time when you wipe. Contact a doctor if:  You have back pain.  You have a fever.  You feel sick to your stomach (nauseous).  You throw up (vomit).  Your symptoms do not get better after 3 days.  Your symptoms go away and then come back. Get help right away if:  You have very bad back pain.  You have very bad lower belly (abdominal) pain.  You are throwing up and cannot keep down any medicines or water. This information is not intended to replace advice given to you by your health care provider. Make sure you discuss any questions you have with your health care provider. Document Released: 11/29/2007 Document Revised: 11/18/2015 Document Reviewed: 05/03/2015  2017 Elsevier   IF you received an x-ray today, you will receive an invoice from [facility]. Please contact [facility]  Radiology at [phone] with questions or concerns regarding your invoice.   IF you received labwork today, you will receive an invoice from [facility]. Please contact [facility] at [phone] with questions or concerns regarding your invoice.   Our billing staff will not be able to assist you with questions regarding bills from these companies.  You will be contacted with the lab results as soon as they are available. The fastest way to get your results is to activate your My Chart account. Instructions are located on the last page of this paperwork. If you have not heard from [place] regarding the results in 2 weeks, please contact this office.

## 2016-06-13 NOTE — Addendum Note (Signed)
Addended by: [name] D on: 06/13/2016 07:45 PM   Modules accepted: Orders

## 2016-06-13 NOTE — Progress Notes (Signed)
06/13/2016 at 5:13 PM  Sheri Robinson / DOB: [birth year] / MRN: [id number]  The patient has Gallstones on her problem list.  SUBJECTIVE  Sheri Robinson is a 35 y.o. female who complains of urinary frequency, abdominal pain, vaginal discharge and urinary hesitancy x 4 days. She denies dysuria, hematuria, flank pain, cloudy malordorous urine and genital rash. Has tried Azo with no relief. Pt is sexually active. Has a new sexual partner and did not use a condom.   She  has a past medical history of Anemia; Arthritis; Asthma; DVT (deep venous thrombosis) ([place]); GERD (gastroesophageal reflux disease); Heart murmur; Hypertension; Miscarriage; Miscarriage (2013); Peripheral vascular disease ([place]) (2014); Pneumonia; and Pulmonary embolism ([place]).    Medications reviewed and updated by myself where necessary, and exist elsewhere in the encounter.   Ms. Bauers is allergic to amoxicillin and penicillins. She  reports that she has never smoked. She has never used smokeless tobacco. She reports that she does not drink alcohol or use drugs. She  reports that she currently engages in sexual activity. The patient  has a past surgical history that includes Myomectomy; IVC FILTER PLACEMENT ([place] HX) (2014); and Cholecystectomy (N/A, 04/05/2016).  Her family history includes Diabetes in her mother; Hypertension in her mother; Stroke in her father.  Review of Systems  Gastrointestinal: Negative for constipation, diarrhea, nausea and vomiting.  Review of Systems  Constitutional: Negative for chills, diaphoresis, malaise/fatigue and weight loss.  Eyes: Positive for blurred vision.  Gastrointestinal: Negative for constipation, diarrhea, nausea and vomiting.    OBJECTIVE  Her  height is 5\' 8"  (1.727 m) and weight is 250 lb (113.4 kg). Her oral temperature is 98.6 F (37 C). Her blood pressure is 124/80 and her pulse is 97. Her respiration is 17 and oxygen saturation is 100%.  The patient's body mass index is  38.01 kg/m.  Physical Exam  Constitutional: She is oriented to person, place, and time. She appears well-developed and well-nourished. No distress.  HENT:  Head: Normocephalic and atraumatic.  Eyes: Conjunctivae are normal.  Neck: Normal range of motion.  Respiratory: Effort normal.  GI: Soft. There is tenderness in the suprapubic area. There is no rigidity, no guarding and no tenderness at McBurney's point.  Neurological: She is alert and oriented to person, place, and time.  Skin: Skin is warm and dry.     Results for orders placed or performed in visit on 06/13/16 (from the past 24 hour(s))  POCT urinalysis dipstick     Status: Abnormal   Collection Time: 06/13/16  4:42 PM  Result Value Ref Range   Color, UA yellow yellow   Clarity, UA cloudy (A) clear   Glucose, UA negative negative   Bilirubin, UA negative negative   Ketones, POC UA negative negative   Spec Grav, UA 1.025    Blood, UA large (A) negative   pH, UA 5.5    Protein Ur, POC =30 (A) negative   Urobilinogen, UA 0.2    Nitrite, UA Negative Negative   Leukocytes, UA large (3+) (A) Negative  POCT Wet + KOH Prep     Status: Abnormal   Collection Time: 06/13/16  4:43 PM  Result Value Ref Range   Yeast by KOH Absent Absent   Yeast by wet prep Absent Absent   WBC by wet prep Few Few   Clue Cells Wet Prep HPF POC None None   Trich by wet prep Absent Absent   Bacteria Wet Prep HPF POC None (A)  Few   Epithelial Cells By [employer] Pref ([facility]) Few None, Few, Too numerous to count   RBC,UR,HPF,POC None None RBC/hpf  POCT Microscopic Urinalysis ([facility])     Status: Abnormal   Collection Time: 06/13/16  4:48 PM  Result Value Ref Range   WBC,UR,HPF,POC Too numerous to count  (A) None WBC/hpf   RBC,UR,HPF,POC None None RBC/hpf   Bacteria None None, Too numerous to count   Mucus Absent Absent   Epithelial Cells, UR Per Microscopy Few (A) None, Too numerous to count cells/hpf  POCT urine pregnancy     Status: None   Collection  Time: 06/13/16  5:01 PM  Result Value Ref Range   Preg Test, Ur Negative Negative    ASSESSMENT & PLAN  Sumer was seen today for dysuria.  Diagnoses and all orders for this visit:  Suprapubic pain -     POCT Wet + KOH Prep -     GC/Chlamydia Probe Amp -     POCT urinalysis dipstick -     POCT Microscopic Urinalysis ([facility]) -     Urine culture -     POCT urine pregnancy  Urinary tract infection without hematuria, site unspecified -     nitrofurantoin, macrocrystal-monohydrate, (MACROBID) 100 MG capsule; Take 1 capsule (100 mg total) by mouth 2 (two) times daily.  Skin lesion of chest wall -     Ambulatory referral to Dermatology    The patient was advised to call or come back to clinic if she does not see an improvement in symptoms, or worsens with the above plan.   [name], PA-C [facility] 06/13/2016 5:13 PM

## 2016-06-15 LAB — URINE CULTURE: Organism ID, Bacteria: NO GROWTH

## 2016-06-15 LAB — GC/CHLAMYDIA PROBE AMP
Chlamydia trachomatis, NAA: NEGATIVE
NEISSERIA GONORRHOEAE BY PCR: NEGATIVE

## 2016-07-22 ENCOUNTER — Other Ambulatory Visit: Payer: Self-pay | Admitting: Physician Assistant

## 2016-07-22 DIAGNOSIS — I10 Essential (primary) hypertension: Secondary | ICD-10-CM

## 2016-07-22 NOTE — Telephone Encounter (Signed)
03/2016 last ov and labs 

## 2016-08-02 ENCOUNTER — Other Ambulatory Visit: Payer: Self-pay | Admitting: General Surgery

## 2016-08-03 DIAGNOSIS — K8020 Calculus of gallbladder without cholecystitis without obstruction: Secondary | ICD-10-CM

## 2016-08-28 ENCOUNTER — Ambulatory Visit (INDEPENDENT_AMBULATORY_CARE_PROVIDER_SITE_OTHER): Payer: BLUE CROSS/BLUE SHIELD | Admitting: Family Medicine

## 2016-08-28 VITALS — BP 119/77 | HR 97 | Temp 98.2°F | Resp 16 | Ht 67.25 in | Wt 259.0 lb

## 2016-08-28 DIAGNOSIS — Z1329 Encounter for screening for other suspected endocrine disorder: Secondary | ICD-10-CM

## 2016-08-28 DIAGNOSIS — I10 Essential (primary) hypertension: Secondary | ICD-10-CM | POA: Diagnosis not present

## 2016-08-28 DIAGNOSIS — Z8639 Personal history of other endocrine, nutritional and metabolic disease: Secondary | ICD-10-CM | POA: Diagnosis not present

## 2016-08-28 DIAGNOSIS — R51 Headache: Secondary | ICD-10-CM

## 2016-08-28 DIAGNOSIS — R519 Headache, unspecified: Secondary | ICD-10-CM

## 2016-08-28 DIAGNOSIS — R5383 Other fatigue: Secondary | ICD-10-CM | POA: Diagnosis not present

## 2016-08-28 MED ORDER — KETOROLAC TROMETHAMINE 60 MG/2ML IM SOLN
60.0000 mg | Freq: Once | INTRAMUSCULAR | Status: AC
  Administered 2016-08-28: 60 mg via INTRAMUSCULAR

## 2016-08-28 MED ORDER — GABAPENTIN 100 MG PO CAPS: 100.0000 mg | ORAL_CAPSULE | Freq: Two times a day (BID) | ORAL | 3 refills | Status: DC

## 2016-08-28 MED ORDER — CYCLOBENZAPRINE HCL 10 MG PO TABS: 5.0000 mg | ORAL_TABLET | Freq: Three times a day (TID) | ORAL | 0 refills | Status: DC | PRN

## 2016-08-28 NOTE — Progress Notes (Signed)
Patient ID: Sheri Robinson, female    DOB: 1981-04-21, 36 y.o.   MRN: [id number]  PCP: Leonie Douglas, PA-C  Chief Complaint  Patient presents with  . Blurred Vision  . Headache  . Epistaxis    periodically    Subjective:  HPI  Sheri Robinson is a 36 y.o., female with hypertension, presents for evaluations of headache, epistaxis, and blurred vision. Monitoring blood pressure at home although she is uncertain of readings as her  mother checks her blood pressured routinely several days per week. Headaches have continued to occur bilateral upon awakening.  Has been taking Advil 6-8 hours, 2 tablets without relief of headache.  Chest tightness for last week with no coughing. No wheezing but shortness of breath. Bilateral eye pain. Mild nasal drainage without congestion. Last week puffy with redness with blurred vision. Once that resolved headache started. Negative for ear pressure. Recent eye exam without change in prescription. No hx of migraine. Feels very fatigue. Rates headache as a 10/10. No swelling in ankles or hands. No dizziness.   Social History   Social History  . Marital status: Single    Spouse name: N/A  . Number of children: N/A  . Years of education: N/A   Occupational History  . Not on file.   Social History Main Topics  . Smoking status: Never Smoker  . Smokeless tobacco: Never Used  . Alcohol use No  . Drug use: No  . Sexual activity: Yes   Other Topics Concern  . Not on file   Social History Narrative  . No narrative on file    Family History  Problem Relation Age of Onset  . Hypertension Mother   . Diabetes Mother   . Stroke Father    Review of Systems See HPI   Prior to Admission medications   Medication Sig Start Date End Date Taking? Authorizing Provider  albuterol (PROVENTIL HFA;VENTOLIN HFA) 108 (90 Base) MCG/ACT inhaler Inhale 2 puffs into the lungs every 6 (six) hours as needed for wheezing or shortness of breath (last used monthe ago).    Yes Historical Provider, MD  amLODipine (NORVASC) 10 MG tablet Take 1 tablet (10 mg total) by mouth daily. 04/01/16  Yes Leonie Douglas, PA-C  Blood Pressure Monitoring (BLOOD PRESSURE CUFF) MISC 1 Device by Does not apply route daily. 12/30/15  Yes Leonie Douglas, PA-C  Cetirizine HCl 10 MG CAPS Take 1 capsule (10 mg total) by mouth daily. 01/22/16  Yes Leonie Douglas, PA-C  cholecalciferol (VITAMIN D) 1000 units tablet Take 2,000 Units by mouth daily.   Yes Historical Provider, MD  fluticasone (FLONASE) 50 MCG/ACT nasal spray Place 2 sprays into both nostrils daily. 05/29/16  Yes Leonie Douglas, PA-C  linaclotide (LINZESS) 145 MCG CAPS capsule Take 145 mcg by mouth daily before breakfast.   Yes Historical Provider, MD  lisinopril (PRINIVIL,ZESTRIL) 10 MG tablet TAKE ONE TABLET BY MOUTH ONCE DAILY 07/22/16  Yes Leonie Douglas, PA-C  Multiple Vitamins-Minerals (MULTIVITAMIN WITH MINERALS) tablet Take 1 tablet by mouth daily.   Yes Historical Provider, MD  omeprazole (PRILOSEC) 40 MG capsule Take 40 mg by mouth daily.   Yes Historical Provider, MD  cyclobenzaprine (FLEXERIL) 10 MG tablet Take 0.5 tablets (5 mg total) by mouth 3 (three) times daily as needed for muscle spasms. 08/28/16   Sedalia Muta, FNP  gabapentin (NEURONTIN) 100 MG capsule Take 1-3 capsules (100-300 mg total) by mouth 2 (two) times daily. 08/28/16  Sedalia Muta, FNP  HYDROcodone-acetaminophen (NORCO/VICODIN) 5-325 MG tablet Take 1-2 tablets by mouth every 4 (four) hours as needed for moderate pain. Patient not taking: Reported on 08/28/2016 04/06/16   Autumn Messing III, MD  ondansetron (ZOFRAN ODT) 4 MG disintegrating tablet Take 1 tablet (4 mg total) by mouth every 8 (eight) hours as needed for nausea or vomiting. Patient not taking: Reported on 08/28/2016 03/22/16   Leonie Douglas, PA-C  triamcinolone (NASACORT) 55 MCG/ACT AERO nasal inhaler Place 2 sprays into the nose daily. Patient not taking:  Reported on 08/28/2016 01/22/16   Leonie Douglas, PA-C    Past Medical, Surgical Family and Social History reviewed and updated.    Objective:   Today's Vitals   08/28/16 1426  BP: 119/77  Pulse: 97  Resp: 16  Temp: 98.2 F (36.8 C)  TempSrc: Oral  SpO2: 98%  Weight: 259 lb (117.5 kg)  Height: 5' 7.25" (1.708 m)    Wt Readings from Last 3 Encounters:  08/28/16 259 lb (117.5 kg)  06/13/16 250 lb (113.4 kg)  05/29/16 252 lb (114.3 kg)   Physical Exam  Constitutional: She is oriented to person, place, and time. She appears well-developed and well-nourished.  HENT:  Head: Normocephalic and atraumatic.  Right Ear: External ear normal.  Left Ear: External ear normal.  Nose: Nose normal.  Mouth/Throat: Oropharynx is clear and moist.  Eyes: Conjunctivae and EOM are normal. Pupils are equal, round, and reactive to light.  Neck: Normal range of motion. Neck supple.  Cardiovascular: Normal rate, regular rhythm, normal heart sounds and intact distal pulses.   Pulmonary/Chest: Effort normal and breath sounds normal.  Musculoskeletal: Normal range of motion.  Neurological: She is alert and oriented to person, place, and time. She has normal reflexes. She displays normal reflexes. She exhibits normal muscle tone. Coordination normal.  Skin: Skin is warm and dry.  Psychiatric: She has a normal mood and affect. Her behavior is normal. Judgment and thought content normal.     Assessment & Plan:  1. Essential hypertension 2. Screening for thyroid disorder 3. Fatigue, unspecified type 4. Hx of electrolyte imbalance 5. Nonintractable headache, unspecified chronicity pattern,  unspecified headache type  Plan: Take Gabapentin 300 mg with food and at bedtime today.  Repeat tomorrow.  You may also take cyclobenzaprine 5 mg up to three times daily for relief of headache.  Both medications causes significant drowsiness. Avoid driving or operating machinery while taking  medication.  If headache is still present on Wednesday, return for care.  You will be notified of lab results via [place].  Carroll Sage. Kenton Kingfisher, MSN, FNP-C [facility]

## 2016-08-28 NOTE — Patient Instructions (Addendum)
Use Ocean saline spray to hydrate nasal mucosa to prevent nose bleeds.  Headache  To abort current headache.......... Take Gabapentin 300 mg with food and at bedtime today  Repeat tomorrow.  You may also take cyclobenzaprine 5 mg up to three times daily for relief of headache.  Both medications causes significant drowsiness. Avoid driving or operating machinery while taking medication.  If headache is still present on Wednesday, return for care.   IF you received an x-ray today, you will receive an invoice from [facility]. Please contact [facility] at [phone] with questions or concerns regarding your invoice.   IF you received labwork today, you will receive an invoice from [place]. Please contact [facility] at [phone] with questions or concerns regarding your invoice.   Our billing staff will not be able to assist you with questions regarding bills from these companies.  You will be contacted with the lab results as soon as they are available. The fastest way to get your results is to activate your My Chart account. Instructions are located on the last page of this paperwork. If you have not heard from [place] regarding the results in 2 weeks, please contact this office.

## 2016-08-28 NOTE — Progress Notes (Deleted)
.   Subjective:  Sheri Robinson is a 36 y.o. female with hypertension. Monitoring blood pressure at home although she is certain of readings and mother told  Headaches had continued bilateral waking up. Been taking Advil 6-8 hours, 2 tabletswithout relief of headache.  Chest tightness for last week with no coughing. No wheezing but shortness of breath. Bilateral eye pain. Mild nasal drainage without congestion. Last week puffy with redness with blurred vision.  Once that resolved headache started. Negative for ear pressure. Recent eye exam without change in prescription. No hx of migraine.  Feels very fatigue. Rates headache as a 10/10  No swelling in ankles or hands. No dizziness.   Current Outpatient Prescriptions  Medication Sig Dispense Refill  . albuterol (PROVENTIL HFA;VENTOLIN HFA) 108 (90 Base) MCG/ACT inhaler Inhale 2 puffs into the lungs every 6 (six) hours as needed for wheezing or shortness of breath (last used monthe ago).    Marland Kitchen amLODipine (NORVASC) 10 MG tablet Take 1 tablet (10 mg total) by mouth daily. 30 tablet 3  . Blood Pressure Monitoring (BLOOD PRESSURE CUFF) MISC 1 Device by Does not apply route daily. 1 each 0  . Cetirizine HCl 10 MG CAPS Take 1 capsule (10 mg total) by mouth daily. 30 capsule 12  . cholecalciferol (VITAMIN D) 1000 units tablet Take 2,000 Units by mouth daily.    . fluticasone (FLONASE) 50 MCG/ACT nasal spray Place 2 sprays into both nostrils daily. 16 g 0  . linaclotide (LINZESS) 145 MCG CAPS capsule Take 145 mcg by mouth daily before breakfast.    . lisinopril (PRINIVIL,ZESTRIL) 10 MG tablet TAKE ONE TABLET BY MOUTH ONCE DAILY 90 tablet 0  . Multiple Vitamins-Minerals (MULTIVITAMIN WITH MINERALS) tablet Take 1 tablet by mouth daily.    Marland Kitchen omeprazole (PRILOSEC) 40 MG capsule Take 40 mg by mouth daily.    Marland Kitchen HYDROcodone-acetaminophen (NORCO/VICODIN) 5-325 MG tablet Take 1-2 tablets by mouth every 4 (four) hours as needed for moderate pain. (Patient not  taking: Reported on 08/28/2016) 30 tablet 0  . ondansetron (ZOFRAN ODT) 4 MG disintegrating tablet Take 1 tablet (4 mg total) by mouth every 8 (eight) hours as needed for nausea or vomiting. (Patient not taking: Reported on 08/28/2016) 20 tablet 0  . triamcinolone (NASACORT) 55 MCG/ACT AERO nasal inhaler Place 2 sprays into the nose daily. (Patient not taking: Reported on 08/28/2016) 1 Inhaler 12   No current facility-administered medications for this visit.     Hypertension ROS: {htn cvs ros:315727::"taking medications as instructed","no medication side effects noted","no TIA's","no chest pain on exertion","no dyspnea on exertion","no swelling of ankles"}.  New concerns: ***.   Objective:  BP 119/77 (BP Location: Right Arm, Patient Position: Sitting, Cuff Size: Large)   Pulse 97   Temp 98.2 F (36.8 C) (Oral)   Resp 16   Ht 5' 7.25" (1.708 m)   Wt 259 lb (117.5 kg)   LMP 08/24/2016   SpO2 98%   BMI 40.26 kg/m   Appearance {appearance:315021::"alert, well appearing, and in no distress"}. General exam {htn exam:315726::"BP noted to be well controlled today in office","S1, S2 normal, no gallop, no murmur, chest clear, no JVD, no HSM, no edema"}.  Lab review: {lab reviewed:315731}.   Assessment:   Hypertension {disease control degree:315147}.   Plan:  {disease follow up plans:315730}.

## 2016-08-30 ENCOUNTER — Encounter: Payer: Self-pay | Admitting: Family Medicine

## 2016-08-30 LAB — CBC WITH DIFFERENTIAL/PLATELET
BASOS: 0 %
Basophils Absolute: 0 10*3/uL (ref 0.0–0.2)
EOS (ABSOLUTE): 0.1 10*3/uL (ref 0.0–0.4)
EOS: 1 %
HEMATOCRIT: 32.9 % — AB (ref 34.0–46.6)
Hemoglobin: 11 g/dL — ABNORMAL LOW (ref 11.1–15.9)
Immature Grans (Abs): 0 10*3/uL (ref 0.0–0.1)
Immature Granulocytes: 0 %
LYMPHS ABS: 1.8 10*3/uL (ref 0.7–3.1)
Lymphs: 27 %
MCH: 29.1 pg (ref 26.6–33.0)
MCHC: 33.4 g/dL (ref 31.5–35.7)
MCV: 87 fL (ref 79–97)
MONOS ABS: 0.4 10*3/uL (ref 0.1–0.9)
Monocytes: 6 %
NEUTROS ABS: 4.3 10*3/uL (ref 1.4–7.0)
Neutrophils: 66 %
Platelets: 292 10*3/uL (ref 150–379)
RBC: 3.78 x10E6/uL (ref 3.77–5.28)
RDW: 14.9 % (ref 12.3–15.4)
WBC: 6.6 10*3/uL (ref 3.4–10.8)

## 2016-08-30 LAB — CMP14+EGFR
A/G RATIO: 1.2 (ref 1.2–2.2)
ALK PHOS: 60 IU/L (ref 39–117)
ALT: 8 IU/L (ref 0–32)
AST: 13 IU/L (ref 0–40)
Albumin: 4.1 g/dL (ref 3.5–5.5)
BILIRUBIN TOTAL: 0.3 mg/dL (ref 0.0–1.2)
BUN/Creatinine Ratio: 11 (ref 9–23)
BUN: 9 mg/dL (ref 6–20)
CALCIUM: 8.9 mg/dL (ref 8.7–10.2)
CO2: 24 mmol/L (ref 18–29)
Chloride: 99 mmol/L (ref 96–106)
Creatinine, Ser: 0.83 mg/dL (ref 0.57–1.00)
GFR calc Af Amer: 104 mL/min/{1.73_m2} (ref >59)
GFR, EST NON AFRICAN AMERICAN: 90 mL/min/{1.73_m2} (ref >59)
GLOBULIN, TOTAL: 3.5 g/dL (ref 1.5–4.5)
Glucose: 86 mg/dL (ref 65–99)
POTASSIUM: 4.3 mmol/L (ref 3.5–5.2)
SODIUM: 141 mmol/L (ref 134–144)
Total Protein: 7.6 g/dL (ref 6.0–8.5)

## 2016-08-30 LAB — THYROID PANEL WITH TSH
Free Thyroxine Index: 2 (ref 1.2–4.9)
T3 Uptake Ratio: 24 % (ref 24–39)
T4 TOTAL: 8.2 ug/dL (ref 4.5–12.0)
TSH: 1.1 u[IU]/mL (ref 0.450–4.500)

## 2016-08-30 LAB — VITAMIN D 25 HYDROXY (VIT D DEFICIENCY, FRACTURES): Vit D, 25-Hydroxy: 25.3 ng/mL — ABNORMAL LOW (ref 30.0–100.0)

## 2016-08-30 MED ORDER — VITAMIN D (ERGOCALCIFEROL) 1.25 MG (50000 UNIT) PO CAPS
50000.0000 [IU] | ORAL_CAPSULE | ORAL | 1 refills | Status: DC
Start: 2016-08-30 — End: 2018-02-23

## 2016-09-27 ENCOUNTER — Other Ambulatory Visit: Payer: Self-pay | Admitting: Physician Assistant

## 2016-09-27 DIAGNOSIS — I10 Essential (primary) hypertension: Secondary | ICD-10-CM

## 2016-09-28 NOTE — Telephone Encounter (Signed)
BP Readings from Last 3 Encounters:  08/28/16 119/77  06/13/16 124/80  05/29/16 128/82

## 2016-10-22 ENCOUNTER — Other Ambulatory Visit: Payer: Self-pay | Admitting: Physician Assistant

## 2016-10-22 DIAGNOSIS — I10 Essential (primary) hypertension: Secondary | ICD-10-CM

## 2016-12-05 ENCOUNTER — Encounter: Payer: Self-pay | Admitting: Student

## 2016-12-05 ENCOUNTER — Ambulatory Visit (INDEPENDENT_AMBULATORY_CARE_PROVIDER_SITE_OTHER): Payer: BLUE CROSS/BLUE SHIELD | Admitting: Student

## 2016-12-05 DIAGNOSIS — A084 Viral intestinal infection, unspecified: Secondary | ICD-10-CM

## 2016-12-05 MED ORDER — ONDANSETRON 8 MG PO TBDP: 8.0000 mg | ORAL_TABLET | Freq: Three times a day (TID) | ORAL | 0 refills | Status: DC | PRN

## 2016-12-05 NOTE — Patient Instructions (Addendum)
   Gastritis, Adult Gastritis is swelling (inflammation) of the stomach. When you have this condition, you can have these problems (symptoms):  Pain in your stomach.  A burning feeling in your stomach.  Feeling sick to your stomach (nauseous).  Throwing up (vomiting).  Feeling too full after you eat.  It is important to get help for this condition. Without help, your stomach can bleed, and you can get sores (ulcers) in your stomach. Follow these instructions at home:  Take over-the-counter and prescription medicines only as told by your doctor.  If you were prescribed an antibiotic medicine, take it as told by your doctor. Do not stop taking it even if you start to feel better.  Drink enough fluid to keep your pee (urine) clear or pale yellow.  Instead of eating big meals, eat small meals often. Contact a health care provider if:  Your problems get worse.  Your problems go away and then come back. Get help right away if:  You throw up blood or something that looks like coffee grounds.  You have black or dark red poop (stools).  You cannot keep fluids down.  Your stomach pain gets worse.  You have a fever.  You do not feel better after 1 week. This information is not intended to replace advice given to you by your health care provider. Make sure you discuss any questions you have with your health care provider. Document Released: 11/29/2007 Document Revised: 02/09/2016 Document Reviewed: 03/06/2015 Elsevier Interactive Patient Education  2018 [employer].    IF you received an x-ray today, you will receive an invoice from [facility]. Please contact [facility] at [phone] with questions or concerns regarding your invoice.   IF you received labwork today, you will receive an invoice from [facility]. Please contact [facility] at [phone] with questions or concerns regarding your invoice.   Our billing staff will not be able to assist you with  questions regarding bills from these companies.  You will be contacted with the lab results as soon as they are available. The fastest way to get your results is to activate your My Chart account. Instructions are located on the last page of this paperwork. If you have not heard from [place] regarding the results in 2 weeks, please contact this office.

## 2016-12-05 NOTE — Assessment & Plan Note (Signed)
Recommend increasing fluids, little sips to prevent vomiting.  Zofran for nausea.  Work note given.  Gave precautions for return to care- inability to take PO, worsening symptoms, unremitting chest pain, worsening shortness of breath.    Chest pain appears related to intercostal irritation from vomiting.  Shortness of breath likely from tachycardia.  Does not appear to be heart related.

## 2016-12-05 NOTE — Progress Notes (Signed)
Subjective:    Patient ID: Sheri Robinson, female    DOB: 04-01-1981, 36 y.o.   MRN: [id number]  HPI Presents with 12 hour history of nausea/vomiting.  She reports little diarrhea. Nonbloody, nonbilious vomiting.  No melena or hematochezia.  Has a h/o cholecystectomy.  Has been able to take down some fluids, but makes her queasy.  She still feels nauseous.  Mild abdominal pain, but no severe pain.  Complains of mild chest pain while vomiting.  Has some shortness of breath while walking, none at rest.  +fevers to 101 at home, no chills.  She has been urinating today, without dysuria.  She works from home and feels unable to work today.     PMHx - Updated and reviewed.  Contributory factors include: HTN, headaches, GERD PSHx - Updated and reviewed.  Contributory factors include:  Negative FHx - Updated and reviewed.  Contributory factors include:  Negative Social Hx - Updated and reviewed. Contributory factors include: Negative Medications - reviewed   Review of Systems  Constitutional: Positive for fatigue and fever. Negative for chills.  HENT: Negative for congestion and rhinorrhea.   Respiratory: Positive for shortness of breath. Negative for apnea, cough, choking, chest tightness, wheezing and stridor.   Cardiovascular: Positive for chest pain. Negative for palpitations and leg swelling.  Gastrointestinal: Positive for abdominal pain, diarrhea, nausea and vomiting. Negative for abdominal distention, anal bleeding, blood in stool, constipation and rectal pain.  Genitourinary: Negative for dysuria and urgency.  Musculoskeletal: Negative for arthralgias and joint swelling.  Skin: Negative for rash and wound.  Psychiatric/Behavioral: Negative for agitation and confusion.  All other systems reviewed and are negative.      Objective:   Physical Exam  Constitutional: She is oriented to person, place, and time. She appears well-developed and well-nourished. No distress.  Appears malaised    HENT:  Head: Normocephalic and atraumatic.  Right Ear: External ear normal.  Left Ear: External ear normal.  Eyes: Conjunctivae are normal. Pupils are equal, round, and reactive to light.  Neck: Normal range of motion. Neck supple.  Cardiovascular: Normal rate, normal heart sounds and intact distal pulses.  Exam reveals no gallop and no friction rub.   No murmur heard. Tachycardia, mild  Pulmonary/Chest: Effort normal and breath sounds normal. No respiratory distress. She has no wheezes. She has no rales. She exhibits no tenderness.  Abdominal: Soft. Bowel sounds are normal. She exhibits no distension. There is no rebound and no guarding.  Musculoskeletal: Normal range of motion. She exhibits no edema.  Lymphadenopathy:    She has no cervical adenopathy.  Neurological: She is alert and oriented to person, place, and time.  Skin: Skin is warm. No rash noted. She is diaphoretic. No erythema.  Psychiatric: She has a normal mood and affect. Her behavior is normal. Judgment and thought content normal.  Nursing note and vitals reviewed.   BP 119/71 (BP Location: Right Arm, Patient Position: Sitting, Cuff Size: Large)   Pulse (!) 106   Temp 98.7 F (37.1 C) (Oral)   Resp 18   Ht 5' 6.46" (1.688 m)   Wt 261 lb (118.4 kg)   LMP 11/18/2016 (Exact Date)   SpO2 99%   BMI 41.55 kg/m        Assessment & Plan:  Viral enteritis Recommend increasing fluids, little sips to prevent vomiting.  Zofran for nausea.  Work note given.  Gave precautions for return to care- inability to take PO, worsening symptoms, unremitting chest pain, worsening  shortness of breath.    Chest pain appears related to intercostal irritation from vomiting.  Shortness of breath likely from tachycardia.  Does not appear to be heart related.  Signed,  Balinda Quails, DO [facility] 4:41 PM 12/05/16

## 2016-12-20 ENCOUNTER — Emergency Department (HOSPITAL_COMMUNITY): Payer: BLUE CROSS/BLUE SHIELD

## 2016-12-20 ENCOUNTER — Encounter (HOSPITAL_COMMUNITY): Payer: Self-pay

## 2016-12-20 ENCOUNTER — Emergency Department (HOSPITAL_COMMUNITY)
Admission: EM | Admit: 2016-12-20 | Discharge: 2016-12-21 | Disposition: A | Discharge status: Home / Self Care | Payer: BLUE CROSS/BLUE SHIELD | Attending: Emergency Medicine | Admitting: Emergency Medicine

## 2016-12-20 DIAGNOSIS — I10 Essential (primary) hypertension: Secondary | ICD-10-CM | POA: Insufficient documentation

## 2016-12-20 DIAGNOSIS — J45909 Unspecified asthma, uncomplicated: Secondary | ICD-10-CM | POA: Insufficient documentation

## 2016-12-20 DIAGNOSIS — Z7902 Long term (current) use of antithrombotics/antiplatelets: Secondary | ICD-10-CM | POA: Insufficient documentation

## 2016-12-20 DIAGNOSIS — J9801 Acute bronchospasm: Secondary | ICD-10-CM

## 2016-12-20 DIAGNOSIS — Z79899 Other long term (current) drug therapy: Secondary | ICD-10-CM | POA: Diagnosis not present

## 2016-12-20 DIAGNOSIS — J189 Pneumonia, unspecified organism: Secondary | ICD-10-CM | POA: Insufficient documentation

## 2016-12-20 DIAGNOSIS — R05 Cough: Secondary | ICD-10-CM | POA: Diagnosis present

## 2016-12-20 MED ORDER — SODIUM CHLORIDE 0.9 % IV BOLUS (SEPSIS)
1000.0000 mL | Freq: Once | INTRAVENOUS | Status: AC
  Administered 2016-12-21: 1000 mL via INTRAVENOUS

## 2016-12-20 MED ORDER — ACETAMINOPHEN 325 MG PO TABS
ORAL_TABLET | ORAL | Status: AC
  Filled 2016-12-20: qty 2

## 2016-12-20 MED ORDER — DIPHENHYDRAMINE HCL 50 MG/ML IJ SOLN
25.0000 mg | Freq: Once | INTRAMUSCULAR | Status: AC
  Administered 2016-12-21: 25 mg via INTRAVENOUS
  Filled 2016-12-20: qty 1

## 2016-12-20 MED ORDER — ALBUTEROL (5 MG/ML) CONTINUOUS INHALATION SOLN
10.0000 mg/h | INHALATION_SOLUTION | Freq: Once | RESPIRATORY_TRACT | Status: AC
  Administered 2016-12-20: 10 mg/h via RESPIRATORY_TRACT
  Filled 2016-12-20: qty 20

## 2016-12-20 MED ORDER — LEVOFLOXACIN IN D5W 750 MG/150ML IV SOLN
750.0000 mg | Freq: Once | INTRAVENOUS | Status: AC
  Administered 2016-12-21: 750 mg via INTRAVENOUS
  Filled 2016-12-20: qty 150

## 2016-12-20 MED ORDER — METOCLOPRAMIDE HCL 5 MG/ML IJ SOLN
10.0000 mg | Freq: Once | INTRAMUSCULAR | Status: AC
  Administered 2016-12-21: 10 mg via INTRAVENOUS
  Filled 2016-12-20: qty 2

## 2016-12-20 MED ORDER — IPRATROPIUM BROMIDE 0.02 % IN SOLN
0.5000 mg | Freq: Once | RESPIRATORY_TRACT | Status: AC
  Administered 2016-12-20: 0.5 mg via RESPIRATORY_TRACT
  Filled 2016-12-20: qty 2.5

## 2016-12-20 MED ORDER — ACETAMINOPHEN 325 MG PO TABS
650.0000 mg | ORAL_TABLET | Freq: Once | ORAL | Status: AC
  Administered 2016-12-20: 650 mg via ORAL

## 2016-12-20 MED ORDER — METHYLPREDNISOLONE SODIUM SUCC 125 MG IJ SOLR
125.0000 mg | Freq: Once | INTRAMUSCULAR | Status: AC
  Administered 2016-12-21: 125 mg via INTRAVENOUS
  Filled 2016-12-20: qty 2

## 2016-12-20 NOTE — ED Provider Notes (Signed)
[facility] DEPT Provider Note   CSN: [id number] Arrival date & time: 12/20/16  1850  By signing my name below, I, Margit Banda, attest that this documentation has been prepared under the direction and in the presence of Rolland Porter, MD. Electronically Signed: Margit Banda, ED Scribe. 12/20/16. 11:20 PM.  TIME SEEN: 23:06  History   Chief Complaint Chief Complaint  Patient presents with  . cough/congestion    HPI Sheri Robinson is a 36 y.o. female with a PMHx of pneumonia, HTN, eczema, DVT, and PE, who presents to the Emergency Department complaining of a nonproductive cough for the last 1.5 weeks. Pt went to her doctor last week and was told to wait it out. Emesis has subsided ~ 5 days ago. Pt reports intermittent sharp left sided chest pain when coughing that becomes heavy chest pressure with deep breathes. Pain radiates to her left arm. Discomfort will last for a few minutes at a time. Associated sx include diaphoresis, chills last night, DOE, wheezing, fever (103 4 days ago), sore throat, and HA. No modifying factors noted. Was taking tessalon pearls with mild relief, but ran out ~ 5 days ago. No hx of asthma. Hx of pneumonia and has been given inhalers when she had the pneumonia. Last time she had pneumonia was last year. Was admitted for several months in 2013 for pneumonia, complicated by pregnancy and then developed DVT/PE. No longer on blood thinners. Doesn't smoke, drink or do drugs. Pt denies diarrhea.  PCP: Leonie Douglas, PA-C  The history is provided by the patient. No language interpreter was used.    Past Medical History:  Diagnosis Date  . Anemia   . Arthritis   . Asthma   . DVT (deep venous thrombosis) ([place])   . GERD (gastroesophageal reflux disease)   . Heart murmur    echo done 2014-[facility]- neg  . Hypertension   . Miscarriage   . Miscarriage 2013   had fibroids- lost blood- 3 tranfusions- developed dvt, pe 2014  . Peripheral  vascular disease ([place]) 2014   pe, dvt  left leg   rx          . Pneumonia   . Pulmonary embolism [facility])     Patient Active Problem List   Diagnosis Date Noted  . Viral enteritis 12/05/2016  . Gallstones 04/05/2016    Past Surgical History:  Procedure Laterality Date  . CHOLECYSTECTOMY N/A 04/05/2016   Procedure: LAPAROSCOPIC CHOLECYSTECTOMY WITH INTRAOPERATIVE CHOLANGIOGRAM;  Surgeon: Autumn Messing III, MD;  Location: [facility];  Service: General;  Laterality: N/A;  . IVC FILTER PLACEMENT ([place] [facility])  2014   removed 5 2014  . MYOMECTOMY      OB History    No data available       Home Medications    Prior to Admission medications   Medication Sig Start Date End Date Taking? Authorizing Provider  amLODipine (NORVASC) 10 MG tablet TAKE ONE TABLET BY MOUTH ONCE DAILY 09/28/16  Yes Jaynee Eagles, PA-C  Cetirizine HCl 10 MG CAPS Take 1 capsule (10 mg total) by mouth daily. 01/22/16  Yes Timmothy Euler, [place] D, PA-C  gabapentin (NEURONTIN) 100 MG capsule Take 1-3 capsules (100-300 mg total) by mouth 2 (two) times daily. Patient taking differently: Take 100 mg by mouth 3 (three) times daily.  08/28/16  Yes Scot Jun, FNP  hydrocortisone 2.5 % ointment Apply topically as needed (to rash).   Yes [provider]  linaclotide Rolan Lipa) 145 MCG  CAPS capsule Take 145 mcg by mouth daily before breakfast.   Yes [provider]  lisinopril (PRINIVIL,ZESTRIL) 10 MG tablet TAKE ONE TABLET BY MOUTH ONCE DAILY 10/23/16  Yes [name] D, PA-C  naproxen sodium (ANAPROX) 550 MG tablet Take 550 mg by mouth as needed for mild pain.   Yes [provider]  omeprazole (PRILOSEC) 40 MG capsule Take 40 mg by mouth daily.   Yes [provider]  Vitamin D, Ergocalciferol, (DRISDOL) 50000 units CAPS capsule Take 1 capsule (50,000 Units total) by mouth every 7 (seven) days. 08/30/16  Yes Scot Jun, FNP  Blood Pressure Monitoring (BLOOD PRESSURE CUFF) MISC 1 Device by Does  not apply route daily. 12/30/15   [name] D, PA-C  cyclobenzaprine (FLEXERIL) 10 MG tablet Take 0.5 tablets (5 mg total) by mouth 3 (three) times daily as needed for muscle spasms. Patient not taking: Reported on 12/20/2016 08/28/16   Scot Jun, FNP  fluticasone [facility]) 50 MCG/ACT nasal spray Place 2 sprays into both nostrils daily. Patient not taking: Reported on 12/20/2016 05/29/16   Leonie Douglas, PA-C  HYDROcodone-acetaminophen (NORCO/VICODIN) 5-325 MG tablet Take 1-2 tablets by mouth every 4 (four) hours as needed for moderate pain. Patient not taking: Reported on 08/28/2016 04/06/16   Autumn Messing III, MD  levofloxacin (LEVAQUIN) 750 MG tablet Take 1 tablet (750 mg total) by mouth daily. 12/21/16   Rolland Porter, MD  ondansetron (ZOFRAN-ODT) 8 MG disintegrating tablet Take 1 tablet (8 mg total) by mouth every 8 (eight) hours as needed for nausea or vomiting. Patient not taking: Reported on 12/20/2016 02/03/90   Chitanand, Elmo Putt B, DO  predniSONE (DELTASONE) 20 MG tablet Take 3 po QD x 3d , then 2 po QD x 3d then 1 po QD x 3d 12/21/16   Rolland Porter, MD  triamcinolone (NASACORT) 55 MCG/ACT AERO nasal inhaler Place 2 sprays into the nose daily. Patient not taking: Reported on 08/28/2016 01/22/16   Leonie Douglas, PA-C    Family History Family History  Problem Relation Age of Onset  . Hypertension Mother   . Diabetes Mother   . Stroke Father     Social History Social History  Substance Use Topics  . Smoking status: Never Smoker  . Smokeless tobacco: Never Used  . Alcohol use No  employed   Allergies   Amoxicillin and Penicillins   Review of Systems Review of Systems  Constitutional: Positive for chills, diaphoresis and fever.  HENT: Positive for sore throat.   Respiratory: Positive for cough, shortness of breath and wheezing.   Cardiovascular: Positive for chest pain.  Gastrointestinal: Negative for diarrhea, nausea and vomiting.  Musculoskeletal: Positive for  arthralgias.  Neurological: Positive for headaches.  All other systems reviewed and are negative.    Physical Exam Updated Vital Signs ED Triage Vitals  Enc Vitals Group     BP 12/20/16 1858 (!) 142/108     Pulse Rate 12/20/16 1858 (!) 117     Resp 12/20/16 1858 (!) 22     Temp 12/20/16 1858 (!) 100.4 F (38 C)     Temp Source 12/20/16 1858 Oral     SpO2 12/20/16 1858 100 %     Weight --      Height --      Head Circumference --      Peak Flow --      Pain Score 12/20/16 1859 9     Pain Loc --  Pain Edu? --      Excl. in [place]? --    Vital signs normal tachycardia, fever, diastolic hypertension   Physical Exam  Constitutional: She is oriented to person, place, and time. She appears well-developed and well-nourished.  Non-toxic appearance. She does not appear ill. No distress.  HENT:  Head: Normocephalic and atraumatic.  Right Ear: External ear normal.  Left Ear: External ear normal.  Nose: Nose normal. No mucosal edema or rhinorrhea.  Mouth/Throat: Oropharynx is clear and moist and mucous membranes are normal. No dental abscesses or uvula swelling.  Eyes: Conjunctivae and EOM are normal. Pupils are equal, round, and reactive to light.  Neck: Normal range of motion and full passive range of motion without pain. Neck supple.  Cardiovascular: Normal rate, regular rhythm and normal heart sounds.  Exam reveals no gallop and no friction rub.   No murmur heard. Pulmonary/Chest: Effort normal. Tachypnea noted. No respiratory distress. She has decreased breath sounds. She has no wheezes. She has no rhonchi. She has no rales. She exhibits no tenderness and no crepitus.  Frequent dry coughing, sounds tight.  Abdominal: Soft. Normal appearance and bowel sounds are normal. She exhibits no distension. There is no tenderness. There is no rebound and no guarding.  Musculoskeletal: Normal range of motion. She exhibits no edema or tenderness.  Moves all extremities well.   Neurological:  She is alert and oriented to person, place, and time. She has normal strength. No cranial nerve deficit.  Skin: Skin is warm, dry and intact. No rash noted. No erythema. No pallor.  Pt has a couple areas of thickening of the skin and purple color around posterior neck, ears and central chest. Reddened areas on her bilateral cheeks. She states is consistent with her prior eczema.   Psychiatric: She has a normal mood and affect. Her speech is normal and behavior is normal. Her mood appears not anxious.  Nursing note and vitals reviewed.    ED Treatments / Results  DIAGNOSTIC STUDIES: Oxygen Saturation is 98% on RA, normal by my interpretation.   Labs (all labs ordered are listed, but only abnormal results are displayed) Results for orders placed or performed during the hospital encounter of 61/44/31  Basic metabolic panel  Result Value Ref Range   Sodium 135 135 - 145 mmol/L   Potassium 3.4 (L) 3.5 - 5.1 mmol/L   Chloride 100 (L) 101 - 111 mmol/L   CO2 24 22 - 32 mmol/L   Glucose, Bld 98 65 - 99 mg/dL   BUN 8 6 - 20 mg/dL   Creatinine, Ser 1.02 (H) 0.44 - 1.00 mg/dL   Calcium 9.1 8.9 - 10.3 mg/dL   GFR calc non Af Amer >60 >60 mL/min   GFR calc Af Amer >60 >60 mL/min   Anion gap 11 5 - 15  CBC with Differential  Result Value Ref Range   WBC 13.2 (H) 4.0 - 10.5 K/uL   RBC 3.75 (L) 3.87 - 5.11 MIL/uL   Hemoglobin 10.5 (L) 12.0 - 15.0 g/dL   HCT 32.9 (L) 36.0 - 46.0 %   MCV 87.7 78.0 - 100.0 fL   MCH 28.0 26.0 - 34.0 pg   MCHC 31.9 30.0 - 36.0 g/dL   RDW 13.6 11.5 - 15.5 %   Platelets 331 150 - 400 K/uL   Neutrophils Relative % 72 %   Neutro Abs 9.4 (H) 1.7 - 7.7 K/uL   Lymphocytes Relative 21 %   Lymphs Abs 2.8 0.7 - 4.0  K/uL   Monocytes Relative 7 %   Monocytes Absolute 0.9 0.1 - 1.0 K/uL   Eosinophils Relative 0 %   Eosinophils Absolute 0.1 0.0 - 0.7 K/uL   Basophils Relative 0 %   Basophils Absolute 0.0 0.0 - 0.1 K/uL  Protime-INR  Result Value Ref Range    Prothrombin Time 13.7 11.4 - 15.2 seconds   INR 1.04   APTT  Result Value Ref Range   aPTT 32 24 - 36 seconds  I-Stat beta hCG blood, ED  Result Value Ref Range   I-stat hCG, quantitative <5.0 <5 mIU/mL   Comment 3           Laboratory interpretation all normal except Leukocytosis, mild anemia, mild hypokalemia    EKG  EKG Interpretation None       Radiology Dg Chest 2 View  Result Date: 12/20/2016 CLINICAL DATA:  36 year old female with chest pain, cough and fever EXAM: CHEST  2 VIEW COMPARISON:  None. FINDINGS: Patchy airspace opacity in the lingula concerning for bronchopneumonia. The remainder the lungs are clear. Cardiac and mediastinal contours are within normal limits. No acute osseous abnormality. IMPRESSION: Patchy airspace opacity in the lingula concerning for bronchopneumonia. Electronically Signed   By: Jacqulynn Cadet M.D.   On: 12/20/2016 19:55   Ct Angio Chest Pe W/cm &/or Wo Cm  Result Date: 12/21/2016 CLINICAL DATA:  Exertional dyspnea and chest pain for 1 week. Fever. EXAM: CT ANGIOGRAPHY CHEST WITH CONTRAST TECHNIQUE: Multidetector CT imaging of the chest was performed using the standard protocol during bolus administration of intravenous contrast. Multiplanar CT image reconstructions and MIPs were obtained to evaluate the vascular anatomy. CONTRAST:  80 mL Isovue 370 intravenous COMPARISON:  12/20/2016 radiographs FINDINGS: Cardiovascular: Suboptimal opacification of the pulmonary arteries. No large or central pulmonary emboli. Normal heart size. Thoracic aorta is normal in caliber and intact. No pericardial effusion. Mediastinum/Nodes: No enlarged mediastinal, hilar, or axillary lymph nodes. Thyroid gland, trachea, and esophagus demonstrate no significant findings. Lungs/Pleura: Dense consolidation in the lingula consistent with pneumonia. This corresponds to the recent radiographic abnormality. The right lung is clear. There is a small left pleural effusion.  Airways are patent and normal in caliber. Upper Abdomen: No acute abnormality. Musculoskeletal: No significant skeletal lesion. Review of the MIP images confirms the above findings. IMPRESSION: 1. Dense lingular consolidation consistent with pneumonia. Very small left pleural effusion. 2. Limited opacification of the pulmonary arteries, without evidence of large or central emboli. Electronically Signed   By: Andreas Newport M.D.   On: 12/21/2016 02:41    Procedures Procedures (including critical care time)  Medications Ordered in ED Medications  albuterol (PROVENTIL HFA;VENTOLIN HFA) 108 (90 Base) MCG/ACT inhaler 2 puff (not administered)  aerochamber Z-Stat Plus/medium 1 each (not administered)  acetaminophen (TYLENOL) tablet 650 mg (650 mg Oral Given 12/20/16 1902)  albuterol (PROVENTIL,VENTOLIN) solution continuous neb (10 mg/hr Nebulization Given 12/20/16 2353)  ipratropium (ATROVENT) nebulizer solution 0.5 mg (0.5 mg Nebulization Given 12/20/16 2353)  sodium chloride 0.9 % bolus 1,000 mL (0 mLs Intravenous Stopped 12/21/16 0214)  methylPREDNISolone sodium succinate (SOLU-MEDROL) 125 mg/2 mL injection 125 mg (125 mg Intravenous Given 12/21/16 0002)  levofloxacin (LEVAQUIN) IVPB 750 mg (750 mg Intravenous New Bag/Given 12/21/16 0229)  metoCLOPramide (REGLAN) injection 10 mg (10 mg Intravenous Given 12/21/16 0006)  diphenhydrAMINE (BENADRYL) injection 25 mg (25 mg Intravenous Given 12/21/16 0004)  albuterol (PROVENTIL,VENTOLIN) solution continuous neb (10 mg/hr Nebulization Given 12/21/16 0207)  ipratropium (ATROVENT) nebulizer solution 0.5 mg (0.5 mg  Nebulization Given 12/21/16 0208)  iopamidol (ISOVUE-370) 76 % injection (80 mLs  Contrast Given 12/21/16 0214)     Initial Impression / Assessment and Plan / ED Course  I have reviewed the triage vital signs and the nursing notes.  Pertinent labs & imaging results that were available during my care of the patient were reviewed by me and considered  in my medical decision making (see chart for details).    COORDINATION OF CARE: 11:20 PM-Discussed next steps with pt which includes a Continuous nebulizer with albuterol and Atrovent, IV for antibiotics and steroids and a CT scanTo look for pulmonary embolus. Pt verbalized understanding and is agreeable with the plan.  1:57 AM Breathing and cough have improved after breathing treatment. Pt will be given another continuous nebulizer. Her breath sounds are still diminished and coughs when she breathes deeply.   Patient was ambulated by nursing staff, her pulse ox was 95-96% on room air. She denied feeling short of breath. She states she actually feels better.  Recheck at 5:30 AM patient looks much more comfortable, she's no longer having the constant hacking cough. When I listen to her now she has improved air movement and normal breath sounds. She was given a albuterol inhaler with a spacer and  instructed on how to use it. She was discharged home on Levaquin 750 mg 5 days and prednisone.  Final Clinical Impressions(s) / ED Diagnoses   Final diagnoses:  Community acquired pneumonia of left lung, unspecified part of lung  Bronchospasm    New Prescriptions New Prescriptions   LEVOFLOXACIN (LEVAQUIN) 750 MG TABLET    Take 1 tablet (750 mg total) by mouth daily.   PREDNISONE (DELTASONE) 20 MG TABLET    Take 3 po QD x 3d , then 2 po QD x 3d then 1 po QD x 3d    Plan discharge  Rolland Porter, MD, FACEP  I personally performed the services described in this documentation, which was scribed in my presence. The recorded information has been reviewed and considered.  Rolland Porter, MD, Barbette Or, MD 12/21/16 0600

## 2016-12-20 NOTE — ED Triage Notes (Signed)
Patient here with cough, congestion and low grade fever x 1 week. complains of chest wall pain with the cough. Dry cough throughout the triage process, NAD

## 2016-12-20 NOTE — ED Notes (Signed)
Apologized for the delay. Gave pt a blanket.  She has mask in place

## 2016-12-20 NOTE — ED Notes (Signed)
Apologized for the delay.

## 2016-12-21 ENCOUNTER — Encounter (HOSPITAL_COMMUNITY): Payer: Self-pay | Admitting: Radiology

## 2016-12-21 ENCOUNTER — Emergency Department (HOSPITAL_COMMUNITY): Payer: BLUE CROSS/BLUE SHIELD

## 2016-12-21 LAB — BASIC METABOLIC PANEL
Anion gap: 11 (ref 5–15)
BUN: 8 mg/dL (ref 6–20)
CHLORIDE: 100 mmol/L — AB (ref 101–111)
CO2: 24 mmol/L (ref 22–32)
Calcium: 9.1 mg/dL (ref 8.9–10.3)
Creatinine, Ser: 1.02 mg/dL — ABNORMAL HIGH (ref 0.44–1.00)
GFR calc Af Amer: >60 mL/min (ref >60)
GFR calc non Af Amer: >60 mL/min (ref >60)
GLUCOSE: 98 mg/dL (ref 65–99)
POTASSIUM: 3.4 mmol/L — AB (ref 3.5–5.1)
Sodium: 135 mmol/L (ref 135–145)

## 2016-12-21 LAB — CBC WITH DIFFERENTIAL/PLATELET
Basophils Absolute: 0 10*3/uL (ref 0.0–0.1)
Basophils Relative: 0 %
EOS PCT: 0 %
Eosinophils Absolute: 0.1 10*3/uL (ref 0.0–0.7)
HCT: 32.9 % — ABNORMAL LOW (ref 36.0–46.0)
Hemoglobin: 10.5 g/dL — ABNORMAL LOW (ref 12.0–15.0)
LYMPHS ABS: 2.8 10*3/uL (ref 0.7–4.0)
LYMPHS PCT: 21 %
MCH: 28 pg (ref 26.0–34.0)
MCHC: 31.9 g/dL (ref 30.0–36.0)
MCV: 87.7 fL (ref 78.0–100.0)
MONO ABS: 0.9 10*3/uL (ref 0.1–1.0)
MONOS PCT: 7 %
Neutro Abs: 9.4 10*3/uL — ABNORMAL HIGH (ref 1.7–7.7)
Neutrophils Relative %: 72 %
PLATELETS: 331 10*3/uL (ref 150–400)
RBC: 3.75 MIL/uL — ABNORMAL LOW (ref 3.87–5.11)
RDW: 13.6 % (ref 11.5–15.5)
WBC: 13.2 10*3/uL — ABNORMAL HIGH (ref 4.0–10.5)

## 2016-12-21 LAB — PROTIME-INR
INR: 1.04
Prothrombin Time: 13.7 seconds (ref 11.4–15.2)

## 2016-12-21 LAB — I-STAT BETA HCG BLOOD, ED (MC, WL, AP ONLY)

## 2016-12-21 LAB — APTT: aPTT: 32 seconds (ref 24–36)

## 2016-12-21 MED ORDER — LEVOFLOXACIN 750 MG PO TABS: 750.0000 mg | ORAL_TABLET | Freq: Every day | ORAL | 0 refills | Status: DC

## 2016-12-21 MED ORDER — IPRATROPIUM BROMIDE 0.02 % IN SOLN
0.5000 mg | Freq: Once | RESPIRATORY_TRACT | Status: AC
  Administered 2016-12-21: 0.5 mg via RESPIRATORY_TRACT
  Filled 2016-12-21: qty 2.5

## 2016-12-21 MED ORDER — AEROCHAMBER PLUS FLO-VU LARGE MISC
1.0000 | Freq: Once | Status: AC
  Administered 2016-12-21: 1

## 2016-12-21 MED ORDER — ALBUTEROL (5 MG/ML) CONTINUOUS INHALATION SOLN
10.0000 mg/h | INHALATION_SOLUTION | Freq: Once | RESPIRATORY_TRACT | Status: AC
  Administered 2016-12-21: 10 mg/h via RESPIRATORY_TRACT
  Filled 2016-12-21: qty 20

## 2016-12-21 MED ORDER — IOPAMIDOL (ISOVUE-370) INJECTION 76%
INTRAVENOUS | Status: AC
  Administered 2016-12-21: 80 mL
  Filled 2016-12-21: qty 100

## 2016-12-21 MED ORDER — PREDNISONE 20 MG PO TABS: ORAL_TABLET | ORAL | 0 refills | Status: DC

## 2016-12-21 MED ORDER — ALBUTEROL SULFATE HFA 108 (90 BASE) MCG/ACT IN AERS
2.0000 | INHALATION_SPRAY | Freq: Four times a day (QID) | RESPIRATORY_TRACT | Status: DC | PRN
  Administered 2016-12-21: 2 via RESPIRATORY_TRACT
  Filled 2016-12-21: qty 6.7

## 2016-12-21 MED ORDER — AEROCHAMBER PLUS FLO-VU LARGE MISC
Status: AC
  Filled 2016-12-21: qty 1

## 2016-12-21 NOTE — ED Notes (Signed)
Patient transported to CT 

## 2016-12-21 NOTE — ED Notes (Signed)
Ambulated pt with pulse ox, pts saturation was 95-96% on RA, pt denies SHOB. States she feels better than when she came in.

## 2016-12-21 NOTE — Discharge Instructions (Signed)
Drink plenty of fluids. You will still need to take acetaminophen for your fever, 1000 mg every 6 hrs as needed. Take the antibiotic until gone and use the inhaler for wheezing or shortness of breath. Take mucinex DM OTC for cough. Recheck if you start feeling worse instead of better. Have your doctor recheck you later next week.

## 2016-12-26 ENCOUNTER — Ambulatory Visit (INDEPENDENT_AMBULATORY_CARE_PROVIDER_SITE_OTHER): Payer: BLUE CROSS/BLUE SHIELD | Admitting: Emergency Medicine

## 2016-12-26 ENCOUNTER — Encounter: Payer: Self-pay | Admitting: Emergency Medicine

## 2016-12-26 VITALS — BP 121/76 | HR 90 | Temp 97.7°F | Resp 18 | Ht 66.46 in | Wt 264.0 lb

## 2016-12-26 DIAGNOSIS — R059 Cough, unspecified: Secondary | ICD-10-CM

## 2016-12-26 DIAGNOSIS — J189 Pneumonia, unspecified organism: Secondary | ICD-10-CM | POA: Diagnosis not present

## 2016-12-26 DIAGNOSIS — R05 Cough: Secondary | ICD-10-CM | POA: Diagnosis not present

## 2016-12-26 HISTORY — DX: Pneumonia, unspecified organism: J18.9

## 2016-12-26 HISTORY — DX: Cough, unspecified: R05.9

## 2016-12-26 MED ORDER — BENZONATATE 200 MG PO CAPS: 200.0000 mg | ORAL_CAPSULE | Freq: Two times a day (BID) | ORAL | 0 refills | Status: DC | PRN

## 2016-12-26 MED ORDER — HYDROCODONE-HOMATROPINE 5-1.5 MG/5ML PO SYRP: 5.0000 mL | ORAL_SOLUTION | Freq: Four times a day (QID) | ORAL | 0 refills | Status: DC | PRN

## 2016-12-26 NOTE — Patient Instructions (Addendum)
  Continue Prednisone and inhaler. Cough medicine as prescribed.   IF you received an x-ray today, you will receive an invoice from [facility]. Please contact [facility] at [phone] with questions or concerns regarding your invoice.   IF you received labwork today, you will receive an invoice from [facility]. Please contact [facility] at [phone] with questions or concerns regarding your invoice.   Our billing staff will not be able to assist you with questions regarding bills from these companies.  You will be contacted with the lab results as soon as they are available. The fastest way to get your results is to activate your My Chart account. Instructions are located on the last page of this paperwork. If you have not heard from [place] regarding the results in 2 weeks, please contact this office.      Community-Acquired Pneumonia, Adult Pneumonia is an infection of the lungs. One type of pneumonia can happen while a person is in a hospital. A different type can happen when a person is not in a hospital (community-acquired pneumonia). It is easy for this kind to spread from person to person. It can spread to you if you breathe near an infected person who coughs or sneezes. Some symptoms include:  A dry cough.  A wet (productive) cough.  Fever.  Sweating.  Chest pain.  Follow these instructions at home:  Take over-the-counter and prescription medicines only as told by your doctor. ? Only take cough medicine if you are losing sleep. ? If you were prescribed an antibiotic medicine, take it as told by your doctor. Do not stop taking the antibiotic even if you start to feel better.  Sleep with your head and neck raised (elevated). You can do this by putting a few pillows under your head, or you can sleep in a recliner.  Do not use tobacco products. These include cigarettes, chewing tobacco, and e-cigarettes. If you need help quitting, ask your doctor.  Drink  enough water to keep your pee (urine) clear or pale yellow. A shot (vaccine) can help prevent pneumonia. Shots are often suggested for:  People older than 38 years of age.  People older than 38 years of age: ? Who are having cancer treatment. ? Who have long-term (chronic) lung disease. ? Who have problems with their body's defense system (immune system).  You may also prevent pneumonia if you take these actions:  Get the flu (influenza) shot every year.  Go to the dentist as often as told.  Wash your hands often. If soap and water are not available, use hand sanitizer.  Contact a doctor if:  You have a fever.  You lose sleep because your cough medicine does not help. Get help right away if:  You are short of breath and it gets worse.  You have more chest pain.  Your sickness gets worse. This is very serious if: ? You are an older adult. ? Your body's defense system is weak.  You cough up blood. This information is not intended to replace advice given to you by your health care provider. Make sure you discuss any questions you have with your health care provider. Document Released: 11/29/2007 Document Revised: 11/18/2015 Document Reviewed: 10/07/2014 Elsevier Interactive Patient Education  [employer].

## 2016-12-26 NOTE — Progress Notes (Signed)
Sheri Robinson 36 y.o.   Chief Complaint  Patient presents with  . Pneumonia    f/u  . Cough    X 2 weeks    HISTORY OF PRESENT ILLNESS: This is a 35 y.o. female here for f/u of pneumonia; seen last week in the ER 12/20/16; had CXR and chest CT; started on Levaquin, Prednisone, and Albuterol MDI; feels at least 50% better; still coughing.  HPI   Prior to Admission medications   Medication Sig Start Date End Date Taking? Authorizing Provider  amLODipine (NORVASC) 10 MG tablet TAKE ONE TABLET BY MOUTH ONCE DAILY 09/28/16  Yes Jaynee Eagles, PA-C  Blood Pressure Monitoring (BLOOD PRESSURE CUFF) MISC 1 Device by Does not apply route daily. 12/30/15  Yes [name] D, PA-C  Cetirizine HCl 10 MG CAPS Take 1 capsule (10 mg total) by mouth daily. 01/22/16  Yes Timmothy Euler, [place] D, PA-C  hydrocortisone 2.5 % ointment Apply topically as needed (to rash).   Yes [provider]  levofloxacin (LEVAQUIN) 750 MG tablet Take 1 tablet (750 mg total) by mouth daily. 12/21/16  Yes Rolland Porter, MD  linaclotide (LINZESS) 145 MCG CAPS capsule Take 145 mcg by mouth daily before breakfast.   Yes [provider]  lisinopril (PRINIVIL,ZESTRIL) 10 MG tablet TAKE ONE TABLET BY MOUTH ONCE DAILY 10/23/16  Yes [name] D, PA-C  naproxen sodium (ANAPROX) 550 MG tablet Take 550 mg by mouth as needed for mild pain.   Yes [provider]  omeprazole (PRILOSEC) 40 MG capsule Take 40 mg by mouth daily.   Yes [provider]  predniSONE (DELTASONE) 20 MG tablet Take 3 po QD x 3d , then 2 po QD x 3d then 1 po QD x 3d 12/21/16  Yes Rolland Porter, MD  Vitamin D, Ergocalciferol, (DRISDOL) 50000 units CAPS capsule Take 1 capsule (50,000 Units total) by mouth every 7 (seven) days. 08/30/16  Yes Scot Jun, FNP  cyclobenzaprine (FLEXERIL) 10 MG tablet Take 0.5 tablets (5 mg total) by mouth 3 (three) times daily as needed for muscle spasms. Patient not taking: Reported on 12/20/2016  08/28/16   Scot Jun, FNP  fluticasone [facility]) 50 MCG/ACT nasal spray Place 2 sprays into both nostrils daily. Patient not taking: Reported on 12/20/2016 05/29/16   [name] D, PA-C  gabapentin (NEURONTIN) 100 MG capsule Take 1-3 capsules (100-300 mg total) by mouth 2 (two) times daily. Patient not taking: Reported on 12/26/2016 08/28/16   Scot Jun, FNP  HYDROcodone-acetaminophen (NORCO/VICODIN) 5-325 MG tablet Take 1-2 tablets by mouth every 4 (four) hours as needed for moderate pain. Patient not taking: Reported on 08/28/2016 04/06/16   Autumn Messing III, MD  ondansetron (ZOFRAN-ODT) 8 MG disintegrating tablet Take 1 tablet (8 mg total) by mouth every 8 (eight) hours as needed for nausea or vomiting. Patient not taking: Reported on 12/20/2016 6/38/75   Chitanand, Elmo Putt B, DO  triamcinolone (NASACORT) 55 MCG/ACT AERO nasal inhaler Place 2 sprays into the nose daily. Patient not taking: Reported on 08/28/2016 01/22/16   [name] D, PA-C    Allergies  Allergen Reactions  . Amoxicillin Anaphylaxis  . Penicillins Anaphylaxis    Patient Active Problem List   Diagnosis Date Noted  . Viral enteritis 12/05/2016  . Gallstones 04/05/2016    Past Medical History:  Diagnosis Date  . Anemia   . Arthritis   . Asthma   . DVT (deep venous thrombosis) ([place])   . GERD (gastroesophageal reflux disease)   .  Heart murmur    echo done 2014-[facility]- neg  . Hypertension   . Miscarriage   . Miscarriage 2013   had fibroids- lost blood- 3 tranfusions- developed dvt, pe 2014  . Peripheral vascular disease ([place]) 2014   pe, dvt  left leg   rx          . Pneumonia   . Pulmonary embolism [facility])     Past Surgical History:  Procedure Laterality Date  . CHOLECYSTECTOMY N/A 04/05/2016   Procedure: LAPAROSCOPIC CHOLECYSTECTOMY WITH INTRAOPERATIVE CHOLANGIOGRAM;  Surgeon: Autumn Messing III, MD;  Location: [place];  Service: General;  Laterality: N/A;  . IVC FILTER PLACEMENT  ([place] HX)  2014   removed 5 2014  . MYOMECTOMY      Social History   Social History  . Marital status: Single    Spouse name: N/A  . Number of children: N/A  . Years of education: N/A   Occupational History  . Not on file.   Social History Main Topics  . Smoking status: Never Smoker  . Smokeless tobacco: Never Used  . Alcohol use No  . Drug use: No  . Sexual activity: Yes   Other Topics Concern  . Not on file   Social History Narrative  . No narrative on file    Family History  Problem Relation Age of Onset  . Hypertension Mother   . Diabetes Mother   . Stroke Father      Review of Systems  Constitutional: Negative.  Negative for chills and fever.  HENT: Negative.   Eyes: Negative.   Respiratory: Positive for cough. Negative for hemoptysis, shortness of breath and wheezing.   Cardiovascular: Negative.  Negative for chest pain and leg swelling.  Gastrointestinal: Negative for abdominal pain, diarrhea, nausea and vomiting.  Musculoskeletal: Negative.   Skin: Negative.  Negative for rash.  Neurological: Negative for dizziness and headaches.  Endo/Heme/Allergies: Negative.   All other systems reviewed and are negative.  Vitals:   12/26/16 1156  BP: 121/76  Pulse: 90  Resp: 18  Temp: 97.7 F (36.5 C)     Physical Exam  Constitutional: She is oriented to person, place, and time. She appears well-developed and well-nourished.  HENT:  Head: Normocephalic and atraumatic.  Nose: Nose normal.  Mouth/Throat: Oropharynx is clear and moist.  Eyes: Conjunctivae and EOM are normal. Pupils are equal, round, and reactive to light.  Neck: Normal range of motion. Neck supple. No JVD present. No thyromegaly present.  Cardiovascular: Normal rate, regular rhythm, normal heart sounds and intact distal pulses.   Pulmonary/Chest: Effort normal and breath sounds normal.  Abdominal: Soft. Bowel sounds are normal. She exhibits no distension. There is no tenderness.    Musculoskeletal: Normal range of motion.  Lymphadenopathy:    She has no cervical adenopathy.  Neurological: She is alert and oriented to person, place, and time. No sensory deficit. She exhibits normal muscle tone.  Skin: Skin is warm and dry. Capillary refill takes less than 2 seconds. No rash noted.  Psychiatric: She has a normal mood and affect. Her behavior is normal.  Vitals reviewed.    ASSESSMENT & PLAN: Sheri Robinson was seen today for pneumonia and cough.  Diagnoses and all orders for this visit:  Community acquired pneumonia of left lung, unspecified part of lung Comments: improving  Cough  Other orders -     benzonatate (TESSALON) 200 MG capsule; Take 1 capsule (200 mg total) by mouth 2 (two) times daily as  needed for cough. -     HYDROcodone-homatropine (HYCODAN) 5-1.5 MG/5ML syrup; Take 5 mLs by mouth every 6 (six) hours as needed for cough.    Patient Instructions    Continue Prednisone and inhaler. Cough medicine as prescribed.   IF you received an x-ray today, you will receive an invoice from [facility]. Please contact [facility] at [phone] with questions or concerns regarding your invoice.   IF you received labwork today, you will receive an invoice from [place]. Please contact [facility] at [phone] with questions or concerns regarding your invoice.   Our billing staff will not be able to assist you with questions regarding bills from these companies.  You will be contacted with the lab results as soon as they are available. The fastest way to get your results is to activate your My Chart account. Instructions are located on the last page of this paperwork. If you have not heard from [place] regarding the results in 2 weeks, please contact this office.      Community-Acquired Pneumonia, Adult Pneumonia is an infection of the lungs. One type of pneumonia can happen while a person is in a hospital. A different type can happen when a  person is not in a hospital (community-acquired pneumonia). It is easy for this kind to spread from person to person. It can spread to you if you breathe near an infected person who coughs or sneezes. Some symptoms include:  A dry cough.  A wet (productive) cough.  Fever.  Sweating.  Chest pain.  Follow these instructions at home:  Take over-the-counter and prescription medicines only as told by your doctor. ? Only take cough medicine if you are losing sleep. ? If you were prescribed an antibiotic medicine, take it as told by your doctor. Do not stop taking the antibiotic even if you start to feel better.  Sleep with your head and neck raised (elevated). You can do this by putting a few pillows under your head, or you can sleep in a recliner.  Do not use tobacco products. These include cigarettes, chewing tobacco, and e-cigarettes. If you need help quitting, ask your doctor.  Drink enough water to keep your pee (urine) clear or pale yellow. A shot (vaccine) can help prevent pneumonia. Shots are often suggested for:  People older than 38 years of age.  People older than 38 years of age: ? Who are having cancer treatment. ? Who have long-term (chronic) lung disease. ? Who have problems with their body's defense system (immune system).  You may also prevent pneumonia if you take these actions:  Get the flu (influenza) shot every year.  Go to the dentist as often as told.  Wash your hands often. If soap and water are not available, use hand sanitizer.  Contact a doctor if:  You have a fever.  You lose sleep because your cough medicine does not help. Get help right away if:  You are short of breath and it gets worse.  You have more chest pain.  Your sickness gets worse. This is very serious if: ? You are an older adult. ? Your body's defense system is weak.  You cough up blood. This information is not intended to replace advice given to you by your health care  provider. Make sure you discuss any questions you have with your health care provider. Document Released: 11/29/2007 Document Revised: 11/18/2015 Document Reviewed: 10/07/2014 Elsevier Interactive Patient Education  2018 [place],  MD [facility]

## 2017-01-01 ENCOUNTER — Ambulatory Visit (INDEPENDENT_AMBULATORY_CARE_PROVIDER_SITE_OTHER): Payer: BLUE CROSS/BLUE SHIELD | Admitting: Physician Assistant

## 2017-01-01 ENCOUNTER — Encounter: Payer: Self-pay | Admitting: Physician Assistant

## 2017-01-01 VITALS — BP 119/82 | HR 107 | Temp 98.2°F | Resp 18 | Ht 66.46 in | Wt 264.2 lb

## 2017-01-01 DIAGNOSIS — R059 Cough, unspecified: Secondary | ICD-10-CM

## 2017-01-01 DIAGNOSIS — R05 Cough: Secondary | ICD-10-CM

## 2017-01-01 DIAGNOSIS — J189 Pneumonia, unspecified organism: Secondary | ICD-10-CM | POA: Diagnosis not present

## 2017-01-01 DIAGNOSIS — D72829 Elevated white blood cell count, unspecified: Secondary | ICD-10-CM

## 2017-01-01 LAB — POCT CBC
Granulocyte percent: 78.4 %G (ref 37–80)
HEMATOCRIT: 33.5 % — AB (ref 37.7–47.9)
Hemoglobin: 11.2 g/dL — AB (ref 12.2–16.2)
LYMPH, POC: 2.8 (ref 0.6–3.4)
MCH, POC: 28.1 pg (ref 27–31.2)
MCHC: 33.3 g/dL (ref 31.8–35.4)
MCV: 84.4 fL (ref 80–97)
MID (CBC): 0.6 (ref 0–0.9)
MPV: 7.1 fL (ref 0–99.8)
POC GRANULOCYTE: 12.4 — AB (ref 2–6.9)
POC LYMPH PERCENT: 17.5 %L (ref 10–50)
POC MID %: 4.1 % (ref 0–12)
Platelet Count, POC: 308 10*3/uL (ref 142–424)
RBC: 3.97 M/uL — AB (ref 4.04–5.48)
RDW, POC: 14.8 %
WBC: 15.8 10*3/uL — AB (ref 4.6–10.2)

## 2017-01-01 MED ORDER — ALBUTEROL SULFATE (2.5 MG/3ML) 0.083% IN NEBU
2.5000 mg | INHALATION_SOLUTION | Freq: Once | RESPIRATORY_TRACT | Status: AC
  Administered 2017-01-01: 2.5 mg via RESPIRATORY_TRACT

## 2017-01-01 MED ORDER — ALBUTEROL SULFATE HFA 108 (90 BASE) MCG/ACT IN AERS: 2.0000 | INHALATION_SPRAY | Freq: Four times a day (QID) | RESPIRATORY_TRACT | 0 refills | Status: DC | PRN

## 2017-01-01 MED ORDER — IPRATROPIUM BROMIDE 0.02 % IN SOLN
0.5000 mg | Freq: Once | RESPIRATORY_TRACT | Status: AC
  Administered 2017-01-01: 0.5 mg via RESPIRATORY_TRACT

## 2017-01-01 NOTE — Progress Notes (Signed)
MRN: [id number] DOB: [birth year]  Subjective:   Sheri Robinson is a 36 y.o. female presenting for follow up on pneumonia. Pt initially evaluated and dx in ED on 12/20/16. CXR and CT with lingular consolidation consistent with pneumonia, WBC of 13.2. She was started on leviquin, prednisone, and albuterol inhaler. Encouraged to follow up at PCP office. She followed up with Dr. Mitchel Honour on 12/26/16 and was feeling about 50% better at that time. Lung exam was normal. Given hycodan and tessalon perles.Today, she returns for additional follow up and for paper work to be completed for work. She now states she is about 75% better from when she was originally dx. Notes she is still coughing. Has associated SOB when she coughs and intermittent headache, worsened with cough. Will occasionally have wheezing. Denies fever, chills, hemoptysis, palpitations, chest pain, and lower leg swelling. States the hycodan has helped but it also upsets her stomach. Just completed prednisone 2 days ago.   Sheri Robinson has a current medication list which includes the following prescription(s): albuterol, amlodipine, benzonatate, blood pressure cuff, cetirizine hcl, cyclobenzaprine, fluticasone, gabapentin, hydrocodone-acetaminophen, hydrocodone-homatropine, hydrocortisone, levofloxacin, linaclotide, lisinopril, naproxen sodium, omeprazole, ondansetron, prednisone, triamcinolone, and vitamin d (ergocalciferol). Also is allergic to amoxicillin and penicillins.  Sheri Robinson  has a past medical history of Anemia; Arthritis; Asthma; DVT (deep venous thrombosis) ([place]); GERD (gastroesophageal reflux disease); Heart murmur; Hypertension; Miscarriage; Miscarriage (2013); Peripheral vascular disease ([place]) (2014); Pneumonia; and Pulmonary embolism ([place]). Also  has a past surgical history that includes Myomectomy; IVC FILTER PLACEMENT ([place] HX) (2014); and Cholecystectomy (N/A, 04/05/2016).   Objective:   Vitals: BP 119/82 (BP Location: Right Arm, Patient  Position: Sitting, Cuff Size: Large)   Pulse (!) 107   Temp 98.2 F (36.8 C) (Oral)   Resp 18   Ht 5' 6.46" (1.688 m)   Wt 264 lb 3.2 oz (119.8 kg)   LMP 12/16/2016 (Exact Date)   SpO2 98%   BMI 42.05 kg/m   Physical Exam  Constitutional: She is oriented to person, place, and time. She appears well-developed and well-nourished. No distress.  HENT:  Head: Normocephalic and atraumatic.  Right Ear: Tympanic membrane, external ear and ear canal normal.  Left Ear: Tympanic membrane, external ear and ear canal normal.  Mouth/Throat: Uvula is midline, oropharynx is clear and moist and mucous membranes are normal.  Eyes: Conjunctivae are normal.  Neck: Normal range of motion.  Cardiovascular: Normal rate, regular rhythm and normal heart sounds.   Pulmonary/Chest: Effort normal. She has no wheezes. She has no rhonchi. She has no rales.  Musculoskeletal:       Right lower leg: She exhibits no swelling.       Left lower leg: She exhibits no swelling.  Lymphadenopathy:       Head (right side): No submental, no submandibular, no tonsillar, no preauricular, no posterior auricular and no occipital adenopathy present.       Head (left side): No submental, no submandibular, no tonsillar, no preauricular, no posterior auricular and no occipital adenopathy present.    She has no cervical adenopathy.       Right: No supraclavicular adenopathy present.       Left: No supraclavicular adenopathy present.  Neurological: She is alert and oriented to person, place, and time.  Skin: Skin is warm and dry.  Psychiatric: She has a normal mood and affect.  Vitals reviewed.   Results for orders placed or performed in visit on 01/01/17 (from the past 24 hour(s))  POCT CBC  Status: Abnormal   Collection Time: 01/01/17  9:16 AM  Result Value Ref Range   WBC 15.8 (A) 4.6 - 10.2 K/uL   Lymph, poc 2.8 0.6 - 3.4   POC LYMPH PERCENT 17.5 10 - 50 %L   MID (cbc) 0.6 0 - 0.9   POC MID % 4.1 0 - 12 %M   POC  Granulocyte 12.4 (A) 2 - 6.9   Granulocyte percent 78.4 37 - 80 %G   RBC 3.97 (A) 4.04 - 5.48 M/uL   Hemoglobin 11.2 (A) 12.2 - 16.2 g/dL   HCT, POC 33.5 (A) 37.7 - 47.9 %   MCV 84.4 80 - 97 fL   MCH, POC 28.1 27 - 31.2 pg   MCHC 33.3 31.8 - 35.4 g/dL   RDW, POC 14.8 %   Platelet Count, POC 308 142 - 424 K/uL   MPV 7.1 0 - 99.8 fL   Post duoneb, coughing improved. CTAB.  Assessment and Plan :  This case was precepted with Dr. Brigitte Pulse.   1. Community acquired pneumonia of left lung, unspecified part of lung Improving. Pt is feeling much better. Will plan for repeat CXR on 01/19/2017 to ensure resolution. - POCT CBC  2. Cough Improved with duoneb.Continue medication as prescribed. - albuterol (PROVENTIL) (2.5 MG/3ML) 0.083% nebulizer solution 2.5 mg; Take 3 mLs (2.5 mg total) by nebulization once.  - ipratropium (ATROVENT) nebulizer solution 0.5 mg; Take 2.5 mLs (0.5 mg total) by nebulization once.  3. Leukocytosis, unspecified type Likely due to recent use of prednisone. Clinically, pt is improving. Plan for follow up in 2 days for repeat CBC. Return sooner if symptoms worsen or new concerning symptoms appear.    [name], PA-C  [facility] 01/01/2017 9:20 AM

## 2017-01-01 NOTE — Patient Instructions (Addendum)
For cough, I recommend drinking warm tea with 2 teaspoons of honey in it. This will also help soothe the throat. Continue using the tessalon perles as needed. If the hycodan is upsetting your stomach, I recommend switching to over the counter medication like delsym to help with the cough. Please continue to hydrate and take breaks as needed at work to help with the cough. Cough can linger for up to 4-6 weeks post pneumonia. Your white blood count is still elevated, which can occur after taking prednisone but we should see this decrease over time. Please return in 2 days for repeat CBC. We will also plan to follow up around 01/19/17 for repeat chest xray. Return sooner if any of your symptoms worsen or you develop new concerning symptoms. Thank you for letting me participate in your health and well being.     Community-Acquired Pneumonia, Adult Pneumonia is an infection of the lungs. One type of pneumonia can happen while a person is in a hospital. A different type can happen when a person is not in a hospital (community-acquired pneumonia). It is easy for this kind to spread from person to person. It can spread to you if you breathe near an infected person who coughs or sneezes. Some symptoms include:  A dry cough.  A wet (productive) cough.  Fever.  Sweating.  Chest pain.  Follow these instructions at home:  Take over-the-counter and prescription medicines only as told by your doctor. ? Only take cough medicine if you are losing sleep. ? If you were prescribed an antibiotic medicine, take it as told by your doctor. Do not stop taking the antibiotic even if you start to feel better.  Sleep with your head and neck raised (elevated). You can do this by putting a few pillows under your head, or you can sleep in a recliner.  Do not use tobacco products. These include cigarettes, chewing tobacco, and e-cigarettes. If you need help quitting, ask your doctor.  Drink enough water to keep your pee  (urine) clear or pale yellow. A shot (vaccine) can help prevent pneumonia. Shots are often suggested for:  People older than 38 years of age.  People older than 38 years of age: ? Who are having cancer treatment. ? Who have long-term (chronic) lung disease. ? Who have problems with their body's defense system (immune system).  You may also prevent pneumonia if you take these actions:  Get the flu (influenza) shot every year.  Go to the dentist as often as told.  Wash your hands often. If soap and water are not available, use hand sanitizer.  Contact a doctor if:  You have a fever.  You lose sleep because your cough medicine does not help. Get help right away if:  You are short of breath and it gets worse.  You have more chest pain.  Your sickness gets worse. This is very serious if: ? You are an older adult. ? Your body's defense system is weak.  You cough up blood. This information is not intended to replace advice given to you by your health care provider. Make sure you discuss any questions you have with your health care provider. Document Released: 11/29/2007 Document Revised: 11/18/2015 Document Reviewed: 10/07/2014 Elsevier Interactive Patient Education  2018 [employer].     IF you received an x-ray today, you will receive an invoice from [facility]. Please contact [facility] at [phone] with questions or concerns regarding your invoice.   IF you received labwork  today, you will receive an invoice from [place]. Please contact [facility] at [phone] with questions or concerns regarding your invoice.   Our billing staff will not be able to assist you with questions regarding bills from these companies.  You will be contacted with the lab results as soon as they are available. The fastest way to get your results is to activate your My Chart account. Instructions are located on the last page of this paperwork. If you have not heard from  [place] regarding the results in 2 weeks, please contact this office.

## 2017-01-02 ENCOUNTER — Ambulatory Visit: Payer: BLUE CROSS/BLUE SHIELD | Admitting: Emergency Medicine

## 2017-01-03 ENCOUNTER — Ambulatory Visit (INDEPENDENT_AMBULATORY_CARE_PROVIDER_SITE_OTHER): Payer: BLUE CROSS/BLUE SHIELD | Admitting: Physician Assistant

## 2017-01-03 ENCOUNTER — Ambulatory Visit (INDEPENDENT_AMBULATORY_CARE_PROVIDER_SITE_OTHER): Payer: BLUE CROSS/BLUE SHIELD

## 2017-01-03 VITALS — BP 127/82 | HR 102 | Temp 98.1°F | Resp 16 | Ht 66.5 in | Wt 265.8 lb

## 2017-01-03 DIAGNOSIS — J189 Pneumonia, unspecified organism: Secondary | ICD-10-CM

## 2017-01-03 DIAGNOSIS — D72829 Elevated white blood cell count, unspecified: Secondary | ICD-10-CM | POA: Diagnosis not present

## 2017-01-03 LAB — POCT CBC
GRANULOCYTE PERCENT: 65.7 % (ref 37–80)
HEMATOCRIT: 32.4 % — AB (ref 37.7–47.9)
HEMOGLOBIN: 10.5 g/dL — AB (ref 12.2–16.2)
Lymph, poc: 2.8 (ref 0.6–3.4)
MCH: 27.7 pg (ref 27–31.2)
MCHC: 32.5 g/dL (ref 31.8–35.4)
MCV: 85.3 fL (ref 80–97)
MID (CBC): 1.4 — AB (ref 0–0.9)
MPV: 7.7 fL (ref 0–99.8)
POC GRANULOCYTE: 8 — AB (ref 2–6.9)
POC LYMPH PERCENT: 23 %L (ref 10–50)
POC MID %: 11.3 % (ref 0–12)
Platelet Count, POC: 279 10*3/uL (ref 142–424)
RBC: 3.8 M/uL — AB (ref 4.04–5.48)
RDW, POC: 15.1 %
WBC: 12.2 10*3/uL — AB (ref 4.6–10.2)

## 2017-01-03 MED ORDER — AZITHROMYCIN 250 MG PO TABS: ORAL_TABLET | ORAL | 0 refills | Status: DC

## 2017-01-03 NOTE — Patient Instructions (Signed)
We are going to start you on a new antibiotic. Please take as prescribed. Follow up on Monday for reevaluation. If you develop any worsening cough, fever, chills, shortness of breath, or new signs of chest pain or lower leg swelling, please seek care immediately at the ED.    Community-Acquired Pneumonia, Adult Pneumonia is an infection of the lungs. One type of pneumonia can happen while a person is in a hospital. A different type can happen when a person is not in a hospital (community-acquired pneumonia). It is easy for this kind to spread from person to person. It can spread to you if you breathe near an infected person who coughs or sneezes. Some symptoms include:  A dry cough.  A wet (productive) cough.  Fever.  Sweating.  Chest pain.  Follow these instructions at home:  Take over-the-counter and prescription medicines only as told by your doctor. ? Only take cough medicine if you are losing sleep. ? If you were prescribed an antibiotic medicine, take it as told by your doctor. Do not stop taking the antibiotic even if you start to feel better.  Sleep with your head and neck raised (elevated). You can do this by putting a few pillows under your head, or you can sleep in a recliner.  Do not use tobacco products. These include cigarettes, chewing tobacco, and e-cigarettes. If you need help quitting, ask your doctor.  Drink enough water to keep your pee (urine) clear or pale yellow. A shot (vaccine) can help prevent pneumonia. Shots are often suggested for:  People older than 38 years of age.  People older than 38 years of age: ? Who are having cancer treatment. ? Who have long-term (chronic) lung disease. ? Who have problems with their body's defense system (immune system).  You may also prevent pneumonia if you take these actions:  Get the flu (influenza) shot every year.  Go to the dentist as often as told.  Wash your hands often. If soap and water are not available,  use hand sanitizer.  Contact a doctor if:  You have a fever.  You lose sleep because your cough medicine does not help. Get help right away if:  You are short of breath and it gets worse.  You have more chest pain.  Your sickness gets worse. This is very serious if: ? You are an older adult. ? Your body's defense system is weak.  You cough up blood. This information is not intended to replace advice given to you by your health care provider. Make sure you discuss any questions you have with your health care provider. Document Released: 11/29/2007 Document Revised: 11/18/2015 Document Reviewed: 10/07/2014 Elsevier Interactive Patient Education  [employer].

## 2017-01-03 NOTE — Progress Notes (Signed)
MRN: [id number] DOB: 20-Jun-1981  Subjective:   Sheri Robinson is a 36 y.o. female presenting for follow up on pneumonia. Pt initially evaluated and dx in ED on 12/20/16. CXR and CT with lingular consolidation consistent with pneumonia, WBC of 13.2. She was started on leviquin, prednisone, and albuterol inhaler. Encouraged to follow up at PCP office. She followed up with in office with Dr. Mitchel Honour on 12/26/16 and was feeling about 50% better, she had just completed leviqun and given Rx for hycodan and tessalon perles and told to follow up if symptoms persisted. She returned on 01/01/17 and was evaluted by me. During this visit, she stated she was about 75% better but still having a cough. She had just completed her prednisone course and was using hycodan and tessalon perles as needed for cough. Her WBC on 01/01/17 remained elevated at 15.8, which was suspected to be due from recent prednisone course. She was given a duoneb in office and told to return in 2 days for reevaluation. Today, she states the cough has persisted. It is a dry hacking cough, which has never been productive. She also developed a fever of 101 again yesterday and felt fatigued. Denies SOB, wheezing, chest pain, palpitations, lower leg swelling, chills, diaphoresis, nausea, and vomiting. Denies recent travel.   Review of Systems  HENT: Negative for sinus pain and sore throat.   Genitourinary: Negative for dysuria, frequency and urgency.  Neurological: Negative for dizziness.   Sheri Robinson has a current medication list which includes the following prescription(s): albuterol, amlodipine, benzonatate, blood pressure cuff, cetirizine hcl, hydrocodone-homatropine, hydrocortisone, linaclotide, lisinopril, naproxen sodium, omeprazole, vitamin d (ergocalciferol), cyclobenzaprine, fluticasone, gabapentin, hydrocodone-acetaminophen, levofloxacin, ondansetron, prednisone, and triamcinolone. Also is allergic to amoxicillin and penicillins.  Sheri Robinson  has a  past medical history of Anemia; Arthritis; Asthma; DVT (deep venous thrombosis) ([place]); GERD (gastroesophageal reflux disease); Heart murmur; Hypertension; Miscarriage; Miscarriage (2013); Peripheral vascular disease ([place]) (2014); Pneumonia; and Pulmonary embolism ([place]). Also  has a past surgical history that includes Myomectomy; IVC FILTER PLACEMENT ([place] [facility]) (2014); and Cholecystectomy (N/A, 04/05/2016).   Objective:   Vitals: BP 127/82   Pulse (!) 102   Temp 98.1 F (36.7 C) (Oral)   Resp 16   Ht 5' 6.5" (1.689 m)   Wt 265 lb 12.8 oz (120.6 kg)   LMP 12/16/2016 (Exact Date)   SpO2 99%   BMI 42.26 kg/m   Physical Exam  Constitutional: She is oriented to person, place, and time. She appears well-developed and well-nourished.  HENT:  Head: Normocephalic and atraumatic.  Eyes: Conjunctivae are normal.  Neck: Normal range of motion.  Cardiovascular: Normal rate, regular rhythm, normal heart sounds and intact distal pulses.   Pulmonary/Chest: Effort normal. She has no decreased breath sounds. She has no wheezes. She has no rhonchi. She has no rales.  Musculoskeletal:       Right lower leg: She exhibits no swelling.       Left lower leg: She exhibits no swelling.  Lymphadenopathy:       Head (right side): No submental, no submandibular, no tonsillar, no preauricular, no posterior auricular and no occipital adenopathy present.       Head (left side): No submental, no submandibular, no tonsillar, no preauricular, no posterior auricular and no occipital adenopathy present.    She has no cervical adenopathy.       Right: No supraclavicular adenopathy present.       Left: No supraclavicular adenopathy present.  Neurological: She is alert and  oriented to person, place, and time.  Skin: Skin is warm and dry.  Psychiatric: She has a normal mood and affect.  Vitals reviewed.   Results for orders placed or performed in visit on 01/03/17 (from the past 24 hour(s))  POCT CBC     Status:  Abnormal   Collection Time: 01/03/17  5:01 PM  Result Value Ref Range   WBC 12.2 (A) 4.6 - 10.2 K/uL   Lymph, poc 2.8 0.6 - 3.4   POC LYMPH PERCENT 23.0 10 - 50 %L   MID (cbc) 1.4 (A) 0 - 0.9   POC MID % 11.3 0 - 12 %M   POC Granulocyte 8.0 (A) 2 - 6.9   Granulocyte percent 65.7 37 - 80 %G   RBC 3.80 (A) 4.04 - 5.48 M/uL   Hemoglobin 10.5 (A) 12.2 - 16.2 g/dL   HCT, POC 32.4 (A) 37.7 - 47.9 %   MCV 85.3 80 - 97 fL   MCH, POC 27.7 27 - 31.2 pg   MCHC 32.5 31.8 - 35.4 g/dL   RDW, POC 15.1 %   Platelet Count, POC 279 142 - 424 K/uL   MPV 7.7 0 - 99.8 fL   Dg Chest 2 View  Result Date: 01/03/2017 CLINICAL DATA:  Pneumonia.  Still having cough and fever. EXAM: CHEST  2 VIEW COMPARISON:  CT of the chest 12/21/2016 FINDINGS: Cardiomediastinal silhouette is normal. Mediastinal contours appear intact. There is no evidence of pneumothorax. There is persistent nodular opacity in the left mid thorax Osseous structures are without acute abnormality. Soft tissues are grossly normal. IMPRESSION: Persistent nodular opacity in the left mid thorax. This may represent persistent airspace consolidation versus pulmonary mass. Electronically Signed   By: Fidela Salisbury M.D.   On: 01/03/2017 17:40    Assessment and Plan :  This case was precepted with Dr.Sagardia  1. Leukocytosis, unspecified type Improving - POCT CBC  2. Community acquired pneumonia of left lung, unspecified part of lung Pt appears stable. Concern for possible tx failure due to persistent cough, recent episode of new fever, and continued leukocytosis. Will cover for atypical organisms with azithromycin. Pt instructed to return on Monday, 01/08/17, for reevaluation. If no improvement, consider urgent pulm referral or contacting infectious disease for further recommendations. Given strict ED precautions both verbally and in AVS.  - DG Chest 2 View; Future - Check Pulse Oximetry while ambulating - azithromycin (ZITHROMAX) 250 MG  tablet; Take 2 tabs PO x 1 dose, then 1 tab PO QD x 4 days  Dispense: 6 tablet; Refill: 0  [name], PA-C  [facility] 01/05/2017 10:32 PM

## 2017-01-05 ENCOUNTER — Ambulatory Visit: Payer: BLUE CROSS/BLUE SHIELD | Admitting: Physician Assistant

## 2017-01-05 ENCOUNTER — Encounter: Payer: Self-pay | Admitting: Physician Assistant

## 2017-01-08 ENCOUNTER — Ambulatory Visit (INDEPENDENT_AMBULATORY_CARE_PROVIDER_SITE_OTHER): Payer: BLUE CROSS/BLUE SHIELD

## 2017-01-08 ENCOUNTER — Encounter: Payer: Self-pay | Admitting: Physician Assistant

## 2017-01-08 ENCOUNTER — Ambulatory Visit (INDEPENDENT_AMBULATORY_CARE_PROVIDER_SITE_OTHER): Payer: BLUE CROSS/BLUE SHIELD | Admitting: Physician Assistant

## 2017-01-08 VITALS — BP 107/70 | HR 93 | Temp 98.3°F | Resp 16 | Ht 66.5 in | Wt 264.6 lb

## 2017-01-08 DIAGNOSIS — J189 Pneumonia, unspecified organism: Secondary | ICD-10-CM

## 2017-01-08 DIAGNOSIS — J029 Acute pharyngitis, unspecified: Secondary | ICD-10-CM

## 2017-01-08 DIAGNOSIS — R059 Cough, unspecified: Secondary | ICD-10-CM

## 2017-01-08 DIAGNOSIS — R062 Wheezing: Secondary | ICD-10-CM | POA: Diagnosis not present

## 2017-01-08 DIAGNOSIS — R05 Cough: Secondary | ICD-10-CM

## 2017-01-08 LAB — POCT CBC
Granulocyte percent: 67.5 %G (ref 37–80)
HCT, POC: 31.6 % — AB (ref 37.7–47.9)
Hemoglobin: 10.4 g/dL — AB (ref 12.2–16.2)
Lymph, poc: 2 (ref 0.6–3.4)
MCH, POC: 27.7 pg (ref 27–31.2)
MCHC: 32.9 g/dL (ref 31.8–35.4)
MCV: 84.2 fL (ref 80–97)
MID (cbc): 0.5 (ref 0–0.9)
MPV: 7.6 fL (ref 0–99.8)
POC Granulocyte: 5.3 (ref 2–6.9)
POC LYMPH PERCENT: 25.9 % (ref 10–50)
POC MID %: 6.6 % (ref 0–12)
Platelet Count, POC: 292 10*3/uL (ref 142–424)
RBC: 3.75 M/uL — AB (ref 4.04–5.48)
RDW, POC: 14.8 %
WBC: 7.9 10*3/uL (ref 4.6–10.2)

## 2017-01-08 MED ORDER — PREDNISONE 20 MG PO TABS
40.0000 mg | ORAL_TABLET | Freq: Every day | ORAL | 0 refills | Status: AC
Start: 2017-01-08 — End: 2017-01-13

## 2017-01-08 MED ORDER — FIRST-DUKES MOUTHWASH MT SUSP: 5.0000 mL | Freq: Four times a day (QID) | OROMUCOSAL | 1 refills | Status: DC

## 2017-01-08 MED ORDER — ALBUTEROL SULFATE (2.5 MG/3ML) 0.083% IN NEBU
2.5000 mg | INHALATION_SOLUTION | Freq: Once | RESPIRATORY_TRACT | Status: AC
  Administered 2017-01-08: 2.5 mg via RESPIRATORY_TRACT

## 2017-01-08 MED ORDER — IPRATROPIUM BROMIDE 0.02 % IN SOLN
0.5000 mg | Freq: Once | RESPIRATORY_TRACT | Status: AC
  Administered 2017-01-08: 0.5 mg via RESPIRATORY_TRACT

## 2017-01-08 MED ORDER — PROMETHAZINE-DM 6.25-15 MG/5ML PO SYRP: 5.0000 mL | ORAL_SOLUTION | Freq: Four times a day (QID) | ORAL | 0 refills | Status: DC | PRN

## 2017-01-08 NOTE — Patient Instructions (Addendum)
Take '40mg'$  Prednisone for 5 days. Con't using inhaler every 4-6 hours for the next 2 days, then as needed.  Warm tea with honey and lemon for sore throat. Con't with salt water gargles.  Please stay well hydrated - drink 2-3 liters daily.  Come back for follow-up in 5-7 days.  Go to the emergency department if your symptoms worsen.   Thank you for coming in today. I hope you feel we met your needs.  Feel free to call [facility] if you have any questions or further requests.  Please consider signing up for MyChart if you do not already have it, as this is a great way to communicate with me.  Best,  Whitney McVey, PA-C   IF you received an x-ray today, you will receive an invoice from [facility]. Please contact [facility] at [phone] with questions or concerns regarding your invoice.   IF you received labwork today, you will receive an invoice from [facility]. Please contact [facility] at [phone] with questions or concerns regarding your invoice.   Our billing staff will not be able to assist you with questions regarding bills from these companies.  You will be contacted with the lab results as soon as they are available. The fastest way to get your results is to activate your My Chart account. Instructions are located on the last page of this paperwork. If you have not heard from [place] regarding the results in 2 weeks, please contact this office.

## 2017-01-08 NOTE — Progress Notes (Signed)
Sheri Robinson  MRN: [id number] DOB: [birth year]  PCP: Leonie Douglas, PA-C  Subjective:  Pt is a 36 year old female who presents to clinic for f/u pneumonia.   Evaluation prior to today's OV has been the following: HPI from [place] 7/11: "Pt initially evaluated and dx in ED on 12/20/16. CXR and CT with lingular consolidation consistent with pneumonia, WBC of 13.2. She was started on leviquin, prednisone, and albuterol inhaler. Encouraged to follow up at PCP office. She followed up with in office with Dr. Mitchel Honour on 12/26/16 and was feeling about 50% better, she had just completed leviqun and given Rx for hycodan and tessalon perles and told to follow up if symptoms persisted. She returned on 01/01/17 and was evaluted by me. During this visit, she stated she was about 75% better but still having a cough. She had just completed her prednisone course and was using hycodan and tessalon perles as needed for cough. Her WBC on 01/01/17 remained elevated at 15.8, which was suspected to be due from recent prednisone course. She was given a duoneb in office and told to return in 2 days for reevaluation. Today, she states the cough has persisted. It is a dry hacking cough, which has never been productive. She also developed a fever of 101 again yesterday and felt fatigued.  Today she is feeling about 75% better than she did when it started. She feels like she is "almost there" C.o coughing attacks at night. Tired, no energy. Cough is not as bad as it was. She has had asthma attack 2 days ago she used an inhaler and got better. She is finished antibiotics. Denies fever, chills, shob, chest pain, palpitations.   Review of Systems  Constitutional: Positive for fatigue. Negative for chills and fever.  Respiratory: Positive for cough, shortness of breath and wheezing. Negative for chest tightness.   Cardiovascular: Negative for chest pain and palpitations.  Neurological: Positive for headaches.    Psychiatric/Behavioral: Positive for sleep disturbance.    Patient Active Problem List   Diagnosis Date Noted  . Community acquired pneumonia of left lung 12/26/2016  . Cough 12/26/2016  . Viral enteritis 12/05/2016  . Gallstones 04/05/2016    Current Outpatient Prescriptions on File Prior to Visit  Medication Sig Dispense Refill  . albuterol (PROVENTIL HFA;VENTOLIN HFA) 108 (90 Base) MCG/ACT inhaler Inhale 2 puffs into the lungs every 6 (six) hours as needed for wheezing or shortness of breath. 1 Inhaler 0  . amLODipine (NORVASC) 10 MG tablet TAKE ONE TABLET BY MOUTH ONCE DAILY 90 tablet 3  . benzonatate (TESSALON) 200 MG capsule Take 1 capsule (200 mg total) by mouth 2 (two) times daily as needed for cough. 20 capsule 0  . Blood Pressure Monitoring (BLOOD PRESSURE CUFF) MISC 1 Device by Does not apply route daily. 1 each 0  . Cetirizine HCl 10 MG CAPS Take 1 capsule (10 mg total) by mouth daily. 30 capsule 12  . cyclobenzaprine (FLEXERIL) 10 MG tablet Take 0.5 tablets (5 mg total) by mouth 3 (three) times daily as needed for muscle spasms. 30 tablet 0  . fluticasone (FLONASE) 50 MCG/ACT nasal spray Place 2 sprays into both nostrils daily. 16 g 0  . gabapentin (NEURONTIN) 100 MG capsule Take 1-3 capsules (100-300 mg total) by mouth 2 (two) times daily. 90 capsule 3  . HYDROcodone-homatropine (HYCODAN) 5-1.5 MG/5ML syrup Take 5 mLs by mouth every 6 (six) hours as needed for cough. 120 mL 0  . hydrocortisone 2.5 %  ointment Apply topically as needed (to rash).    Marland Kitchen linaclotide (LINZESS) 145 MCG CAPS capsule Take 145 mcg by mouth daily before breakfast.    . lisinopril (PRINIVIL,ZESTRIL) 10 MG tablet TAKE ONE TABLET BY MOUTH ONCE DAILY 90 tablet 0  . naproxen sodium (ANAPROX) 550 MG tablet Take 550 mg by mouth as needed for mild pain.    Marland Kitchen omeprazole (PRILOSEC) 40 MG capsule Take 40 mg by mouth daily.    Marland Kitchen triamcinolone (NASACORT) 55 MCG/ACT AERO nasal inhaler Place 2 sprays into the nose  daily. 1 Inhaler 12  . Vitamin D, Ergocalciferol, (DRISDOL) 50000 units CAPS capsule Take 1 capsule (50,000 Units total) by mouth every 7 (seven) days. 30 capsule 1  . azithromycin (ZITHROMAX) 250 MG tablet Take 2 tabs PO x 1 dose, then 1 tab PO QD x 4 days (Patient not taking: Reported on 01/08/2017) 6 tablet 0  . HYDROcodone-acetaminophen (NORCO/VICODIN) 5-325 MG tablet Take 1-2 tablets by mouth every 4 (four) hours as needed for moderate pain. (Patient not taking: Reported on 01/08/2017) 30 tablet 0  . levofloxacin (LEVAQUIN) 750 MG tablet Take 1 tablet (750 mg total) by mouth daily. (Patient not taking: Reported on 01/08/2017) 5 tablet 0  . ondansetron (ZOFRAN-ODT) 8 MG disintegrating tablet Take 1 tablet (8 mg total) by mouth every 8 (eight) hours as needed for nausea or vomiting. (Patient not taking: Reported on 01/08/2017) 20 tablet 0  . predniSONE (DELTASONE) 20 MG tablet Take 3 po QD x 3d , then 2 po QD x 3d then 1 po QD x 3d (Patient not taking: Reported on 01/08/2017) 18 tablet 0   No current facility-administered medications on file prior to visit.     Allergies  Allergen Reactions  . Amoxicillin Anaphylaxis  . Penicillins Anaphylaxis     Objective:  BP 107/70 (BP Location: Right Arm, Patient Position: Sitting, Cuff Size: Large)   Pulse 93   Temp 98.3 F (36.8 C) (Oral)   Resp 16   Ht 5' 6.5" (1.689 m)   Wt 264 lb 9.6 oz (120 kg)   LMP 12/16/2016 (Exact Date)   SpO2 98%   BMI 42.07 kg/m   spO2 97%  Physical Exam  Constitutional: She is oriented to person, place, and time and well-developed, well-nourished, and in no distress. No distress.  Cardiovascular: Normal rate, regular rhythm and normal heart sounds.   Pulmonary/Chest: Effort normal. She has no decreased breath sounds. She has wheezes. She has no rhonchi. She has no rales.  Neurological: She is alert and oriented to person, place, and time. GCS score is 15.  Skin: Skin is warm and dry.  Psychiatric: Mood, memory,  affect and judgment normal.  Vitals reviewed.   Dg Chest 2 View  Result Date: 01/08/2017 CLINICAL DATA:  Initial evaluation for pneumonia. EXAM: CHEST  2 VIEW COMPARISON:  Prior radiograph from 01/03/2017. FINDINGS: Cardiac and mediastinal silhouettes are stable in size and contour, and remain within normal limits. Current examination has been performed with a similar degree of a mildly shallow lung inflation. Patchy parenchymal infiltrate present within the lingula, mildly progressed relative to recent examination. No other new focal infiltrates. No pulmonary edema or pleural effusion. No pneumothorax. No acute osseus abnormality. IMPRESSION: Mild interval progression of patchy infiltrate within the lingula, consistent with pneumonia. Follow-up examination in 4-6 weeks to ensure resolution is suggested. Electronically Signed   By: Jeannine Boga M.D.   On: 01/08/2017 16:10   Results for orders placed or performed in  visit on 01/08/17  POCT CBC  Result Value Ref Range   WBC 7.9 4.6 - 10.2 K/uL   Lymph, poc 2.0 0.6 - 3.4   POC LYMPH PERCENT 25.9 10 - 50 %L   MID (cbc) 0.5 0 - 0.9   POC MID % 6.6 0 - 12 %M   POC Granulocyte 5.3 2 - 6.9   Granulocyte percent 67.5 37 - 80 %G   RBC 3.75 (A) 4.04 - 5.48 M/uL   Hemoglobin 10.4 (A) 12.2 - 16.2 g/dL   HCT, POC 31.6 (A) 37.7 - 47.9 %   MCV 84.2 80 - 97 fL   MCH, POC 27.7 27 - 31.2 pg   MCHC 32.9 31.8 - 35.4 g/dL   RDW, POC 14.8 %   Platelet Count, POC 292 142 - 424 K/uL   MPV 7.6 0 - 99.8 fL    Assessment and Plan :  1. Pneumonia due to infectious organism, unspecified laterality, unspecified part of lung - POCT CBC - DG Chest 2 View; Future - X-ray shows pneumonia. Her infection could be resolved due to recent treatment and has not yet cleared from imaging. RTC in 5-7 days for recheck symptoms. Follow-up examination in 4-6 weeks to ensure resolution is suggested. Consider pulm referral.  2. Wheezing 3. Cough 4. Sore throat -  albuterol (PROVENTIL) (2.5 MG/3ML) 0.083% nebulizer solution 2.5 mg; Take 3 mLs (2.5 mg total) by nebulization once. - ipratropium (ATROVENT) nebulizer solution 0.5 mg; Take 2.5 mLs (0.5 mg total) by nebulization once. - predniSONE (DELTASONE) 20 MG tablet; Take 2 tablets (40 mg total) by mouth daily.  Dispense: 10 tablet; Refill: 0 - Pt reports improvement following breathing treatment. Will continue prednisone therapy at home - promethazine-dextromethorphan (PROMETHAZINE-DM) 6.25-15 MG/5ML syrup; Take 5 mLs by mouth 4 (four) times daily as needed for cough.  Dispense: 180 mL; Refill: 0 - Diphenhyd-Hydrocort-Nystatin (FIRST-DUKES MOUTHWASH) SUSP; Use as directed 5 mLs in the mouth or throat 4 (four) times daily. Rinse and spit  Dispense: 237 mL; Refill: 1 - Take 40mg  Prednisone for 5 days. Con't using inhaler every 4-6 hours for the next 2 days, then as needed. Warm tea with honey and lemon for sore throat. Con't with salt water gargles. Stay well hydrated.    Mercer Pod, PA-C  [facility] 01/08/2017 3:46 PM

## 2017-01-09 ENCOUNTER — Telehealth: Payer: Self-pay | Admitting: Physician Assistant

## 2017-01-09 ENCOUNTER — Encounter: Payer: Self-pay | Admitting: Physician Assistant

## 2017-01-09 NOTE — Telephone Encounter (Signed)
Patient needs disability forms completed from her job for the dates she was out of work with her pneumonia. I have completed what I could from the [place] notes and highlighted the areas I was unsure about. I will place the forms in [place] box on 01/09/17 please return them to the FMLA/Disability box at the [employer] checkout desk within 5-7 business days. Thank you!

## 2017-01-15 ENCOUNTER — Ambulatory Visit (INDEPENDENT_AMBULATORY_CARE_PROVIDER_SITE_OTHER): Payer: BLUE CROSS/BLUE SHIELD | Admitting: Physician Assistant

## 2017-01-15 ENCOUNTER — Encounter: Payer: Self-pay | Admitting: Physician Assistant

## 2017-01-15 VITALS — BP 115/70 | HR 96 | Temp 98.0°F | Resp 18 | Ht 66.5 in | Wt 272.0 lb

## 2017-01-15 DIAGNOSIS — J189 Pneumonia, unspecified organism: Secondary | ICD-10-CM

## 2017-01-15 DIAGNOSIS — M940 Chondrocostal junction syndrome [Tietze]: Secondary | ICD-10-CM | POA: Diagnosis not present

## 2017-01-15 DIAGNOSIS — D649 Anemia, unspecified: Secondary | ICD-10-CM

## 2017-01-15 NOTE — Progress Notes (Signed)
MRN: [id number] DOB: August 10, 1980  Subjective:   Sheri Robinson is a 36 y.o. female presenting for follow up on pneumonia. Pt initially evaluated and dx in ED on 12/20/16. CXR and CT with lingular consolidation consistent with pneumonia, WBC of 13.2. She was started on leviquin, prednisone, and albuterol inhaler. Encouraged to follow up at PCP office. She followed up with in office with Dr. Mitchel Honour on 12/26/16 and was feeling about 50% better, she had just completed leviqun and given Rx for hycodan and tessalon perles and told to follow up if symptoms persisted. She returned on 01/01/17 and was evaluted by me. During this visit, she stated she was about 75% better but still having a cough. She had just completed her prednisone course and was using hycodan and tessalon perles as needed for cough. Her WBC on 01/01/17 remained elevated at 15.8, which was suspected to be due from recent prednisone course. She was given a duoneb in office and told to return in 2 days for reevaluation. On 01/03/18 visit, she endorsed one day of fever and continued cough. Her CXR showed persistent consolidation. WBC decreased to 12.2. During that visit, I was concerned for possible tx failure due to persistent cough, recent episode of new fever, and continued leukocytosis. Given Rx for atypical organism coverage with azithromycin. She returned for reevaluation on 01/08/17, WBC normalized at 7.9, CXR showed mild interval progression of patchy infiltrate within the lingula,consistent with pneumonia. Pt given prednisone 40mg  x 5 days. Instructed to follow up in 5-7 days. Today, pt reports she is feeling overall better but she is still having a continuous dry hacking cough and fatigue. States the cough has not seemed to improve much at all. She has also caused voice strain from excessive coughing. Has completed azithromycin and prednisone. Using albuterol inhaler as needed. Denies fever, chills, SOB, wheezing, nausea, and vomiting.  Pt is  concerned she may have underlying asthma. States a provider mentioned it to her before but she has never been evaluated for it.   Pt would also like to discuss anemia. Was followed by hematology in [place]. She has only had a transfusion a couple times in the past when she lived in [place]. She denies hx of sickle cell disease or hypercoagulable disorders. Pt is fatigued most of the time. Denies menorrhagia or bloody stools. She has not seen a hematologist here but was supposed to follow up with them. Last peripheral smear in 03/2016 showed Normocytic anemia with polychromasia. Iron, IBC panel, and hemoglobinaopathy normal at that time.   Review of Systems  Constitutional: Negative for diaphoresis.  Respiratory: Negative for hemoptysis.   Cardiovascular: Negative for palpitations and leg swelling.  Neurological: Negative for dizziness.   Sheri Robinson has a current medication list which includes the following prescription(s): albuterol, amlodipine, benzonatate, blood pressure cuff, cetirizine hcl, cyclobenzaprine, first-dukes mouthwash, fluticasone, gabapentin, hydrocodone-homatropine, hydrocortisone, linaclotide, lisinopril, naproxen sodium, omeprazole, promethazine-dextromethorphan, triamcinolone, and vitamin d (ergocalciferol). Also is allergic to amoxicillin and penicillins.  Sheri Robinson  has a past medical history of Anemia; Arthritis; Asthma; DVT (deep venous thrombosis) ([place]); GERD (gastroesophageal reflux disease); Heart murmur; Hypertension; Miscarriage; Miscarriage (2013); Peripheral vascular disease ([place]) (2014); Pneumonia; and Pulmonary embolism ([place]). Also  has a past surgical history that includes Myomectomy; IVC FILTER PLACEMENT ([place] HX) (2014); and Cholecystectomy (N/A, 04/05/2016).   Objective:   Vitals: BP 115/70 (BP Location: Right Arm, Patient Position: Sitting, Cuff Size: Large)   Pulse 96   Temp 98 F (36.7 C) (Oral)   Resp  18   Ht 5' 6.5" (1.689 m)   Wt 272 lb (123.4 kg)   LMP 12/14/2016  (Exact Date)   BMI 43.24 kg/m   Physical Exam  Constitutional: She is oriented to person, place, and time. She appears well-developed and well-nourished.  HENT:  Head: Normocephalic and atraumatic.  Eyes: Conjunctivae are normal.  Neck: Normal range of motion.  Cardiovascular: Normal rate, regular rhythm and normal heart sounds.   Pulmonary/Chest: Effort normal and breath sounds normal. She has no wheezes. She has no rhonchi. She has no rales. She exhibits tenderness (TTP along anterior costal cartilage bilaterally).  Difficult lung exam due to excessive coughing.  Neurological: She is alert and oriented to person, place, and time.  Skin: Skin is warm and dry.  Psychiatric: She has a normal mood and affect.  Vitals reviewed.  No results found for this or any previous visit (from the past 24 hour(s)).  Assessment and Plan :  1. Community acquired pneumonia of left lung, unspecified part of lung Improving but cough and fatigue still present. Due to duration and severity of cough, pt would benefit from further evaluation by pulmonology at this time. Continue warm tea with honey and cough syrup as needed for cough. Recommend repeat CXR in 5 weeks to ensure resolution. - Ambulatory referral to Pulmonology  2. Anemia, unspecified type - Ambulatory referral to Hematology  3. Costochondritis Given educational material on costochondrits. Instructed to use naproxen daily for the next week and apply heat/ice to affected area 4-5 x day for 20 min at time. Return to clinic if symptoms worsen, do not improve, or as needed.   [name], PA-C  Primary Care at [facility] 01/15/2017 2:52 PM

## 2017-01-15 NOTE — Patient Instructions (Addendum)
For cough, I would like you to continue warm tea with honey and cough syrup and inhaler as needed. Due to duration of cough, I would like you to follow up with pulmonology. I placed an urgent referral so they should contact you later this week. If any of your symptoms worsen please seek care immediately. For the costochondritis, take naproxen daily for the next week and apply ice/heat to affected area up to 4-5 x day for 20 min at a time. Do not apply directly to the skin.   For anemia, I have placed a referral to hematology. They should contact you within 1-2 weeks.   Costochondritis Costochondritis is swelling and irritation (inflammation) of the tissue (cartilage) that connects your ribs to your breastbone (sternum). This causes pain in the front of your chest. Usually, the pain:  Starts gradually.  Is in more than one rib.  This condition usually goes away on its own over time. Follow these instructions at home:  Do not do anything that makes your pain worse.  If directed, put ice on the painful area: ? Put ice in a plastic bag. ? Place a towel between your skin and the bag. ? Leave the ice on for 20 minutes, 2-3 times a day.  If directed, put heat on the affected area as often as told by your doctor. Use the heat source that your doctor tells you to use, such as a moist heat pack or a heating pad. ? Place a towel between your skin and the heat source. ? Leave the heat on for 20-30 minutes. ? Take off the heat if your skin turns bright red. This is very important if you cannot feel pain, heat, or cold. You may have a greater risk of getting burned.  Take over-the-counter and prescription medicines only as told by your doctor.  Return to your normal activities as told by your doctor. Ask your doctor what activities are safe for you.  Keep all follow-up visits as told by your doctor. This is important. Contact a doctor if:  You have chills or a fever.  Your pain does not go away  or it gets worse.  You have a cough that does not go away. Get help right away if:  You are short of breath. This information is not intended to replace advice given to you by your health care provider. Make sure you discuss any questions you have with your health care provider. Document Released: 11/29/2007 Document Revised: 12/31/2015 Document Reviewed: 10/06/2015 Elsevier Interactive Patient Education  2018 [employer].   IF you received an x-ray today, you will receive an invoice from [facility]. Please contact [facility] at [phone] with questions or concerns regarding your invoice.   IF you received labwork today, you will receive an invoice from [facility]. Please contact [facility] at [phone] with questions or concerns regarding your invoice.   Our billing staff will not be able to assist you with questions regarding bills from these companies.  You will be contacted with the lab results as soon as they are available. The fastest way to get your results is to activate your My Chart account. Instructions are located on the last page of this paperwork. If you have not heard from [place] regarding the results in 2 weeks, please contact this office.

## 2017-01-16 NOTE — Telephone Encounter (Signed)
I have completed these forms.

## 2017-01-16 NOTE — Telephone Encounter (Signed)
Paperwork scanned and faxed on 01/16/17

## 2017-01-19 ENCOUNTER — Telehealth: Payer: Self-pay | Admitting: Physician Assistant

## 2017-01-19 NOTE — Telephone Encounter (Signed)
Pt is wanting to talk with brittany about the pulmonary referral stating that the next is appt is availabe in October and she is needing to talk with someone about this   Best number [phone]

## 2017-01-22 ENCOUNTER — Telehealth: Payer: Self-pay | Admitting: Physician Assistant

## 2017-01-22 NOTE — Telephone Encounter (Signed)
Called over to [facility] to arrange sooner apt. New scheduled appointment made for August 1st with Dr. Halford Chessman @ 945am

## 2017-01-22 NOTE — Telephone Encounter (Signed)
Pt stated that her job will be faxing her fmla ppw back because Timmothy Euler has to fill something out on it. Also pt stated that her pulmonary referral she received stated they dont have any openings until oct pt would like something sooner.  Please advise: [phone]

## 2017-01-24 ENCOUNTER — Other Ambulatory Visit: Payer: Self-pay | Admitting: Physician Assistant

## 2017-01-24 ENCOUNTER — Ambulatory Visit (INDEPENDENT_AMBULATORY_CARE_PROVIDER_SITE_OTHER): Payer: BLUE CROSS/BLUE SHIELD | Admitting: Pulmonary Disease

## 2017-01-24 ENCOUNTER — Other Ambulatory Visit (INDEPENDENT_AMBULATORY_CARE_PROVIDER_SITE_OTHER): Payer: BLUE CROSS/BLUE SHIELD

## 2017-01-24 ENCOUNTER — Encounter: Payer: Self-pay | Admitting: Pulmonary Disease

## 2017-01-24 VITALS — BP 120/82 | HR 80 | Ht 67.0 in | Wt 268.8 lb

## 2017-01-24 DIAGNOSIS — I10 Essential (primary) hypertension: Secondary | ICD-10-CM

## 2017-01-24 DIAGNOSIS — J45909 Unspecified asthma, uncomplicated: Secondary | ICD-10-CM | POA: Diagnosis not present

## 2017-01-24 DIAGNOSIS — R059 Cough, unspecified: Secondary | ICD-10-CM

## 2017-01-24 DIAGNOSIS — R918 Other nonspecific abnormal finding of lung field: Secondary | ICD-10-CM

## 2017-01-24 DIAGNOSIS — R05 Cough: Secondary | ICD-10-CM | POA: Diagnosis not present

## 2017-01-24 LAB — COMPREHENSIVE METABOLIC PANEL
ALK PHOS: 53 U/L (ref 39–117)
ALT: 10 U/L (ref 0–35)
AST: 11 U/L (ref 0–37)
Albumin: 4 g/dL (ref 3.5–5.2)
BUN: 12 mg/dL (ref 6–23)
CO2: 29 mEq/L (ref 19–32)
CREATININE: 1 mg/dL (ref 0.40–1.20)
Calcium: 9.3 mg/dL (ref 8.4–10.5)
Chloride: 101 mEq/L (ref 96–112)
GFR: 65.83 mL/min (ref >60.00)
GLUCOSE: 105 mg/dL — AB (ref 70–99)
POTASSIUM: 4.1 meq/L (ref 3.5–5.1)
SODIUM: 138 meq/L (ref 135–145)
TOTAL PROTEIN: 7.5 g/dL (ref 6.0–8.3)
Total Bilirubin: 0.3 mg/dL (ref 0.2–1.2)

## 2017-01-24 LAB — CBC WITH DIFFERENTIAL/PLATELET
Basophils Absolute: 0.1 10*3/uL (ref 0.0–0.1)
Basophils Relative: 1 % (ref 0.0–3.0)
EOS ABS: 0.1 10*3/uL (ref 0.0–0.7)
EOS PCT: 1 % (ref 0.0–5.0)
HEMATOCRIT: 34.3 % — AB (ref 36.0–46.0)
HEMOGLOBIN: 11.2 g/dL — AB (ref 12.0–15.0)
LYMPHS PCT: 20.2 % (ref 12.0–46.0)
Lymphs Abs: 1.9 10*3/uL (ref 0.7–4.0)
MCHC: 32.5 g/dL (ref 30.0–36.0)
MCV: 87.1 fl (ref 78.0–100.0)
MONO ABS: 0.8 10*3/uL (ref 0.1–1.0)
Monocytes Relative: 8 % (ref 3.0–12.0)
Neutro Abs: 6.6 10*3/uL (ref 1.4–7.7)
Neutrophils Relative %: 69.8 % (ref 43.0–77.0)
Platelets: 291 10*3/uL (ref 150.0–400.0)
RBC: 3.93 Mil/uL (ref 3.87–5.11)
RDW: 14.5 % (ref 11.5–15.5)
WBC: 9.4 10*3/uL (ref 4.0–10.5)

## 2017-01-24 MED ORDER — MONTELUKAST SODIUM 10 MG PO TABS: 10.0000 mg | ORAL_TABLET | Freq: Every day | ORAL | 5 refills | Status: DC

## 2017-01-24 MED ORDER — BUDESONIDE-FORMOTEROL FUMARATE 160-4.5 MCG/ACT IN AERO: 2.0000 | INHALATION_SPRAY | Freq: Two times a day (BID) | RESPIRATORY_TRACT | 6 refills | Status: DC

## 2017-01-24 MED ORDER — CHLORPHENIRAMINE MALEATE 4 MG PO TABS: 4.0000 mg | ORAL_TABLET | Freq: Every day | ORAL | 0 refills | Status: DC

## 2017-01-24 NOTE — Progress Notes (Signed)
   Subjective:    Patient ID: Sheri Robinson, female    DOB: [birth year], 36 y.o.   MRN: [id number]  HPI    Review of Systems  Constitutional: Positive for unexpected weight change. Negative for fever.  HENT: Positive for congestion, postnasal drip, rhinorrhea, sinus pain, sinus pressure, sneezing, sore throat and voice change. Negative for dental problem, ear pain, nosebleeds and trouble swallowing.   Eyes: Negative for redness and itching.  Respiratory: Positive for cough and shortness of breath. Negative for chest tightness and wheezing.   Cardiovascular: Positive for chest pain. Negative for palpitations and leg swelling.  Gastrointestinal: Positive for nausea. Negative for vomiting.       Acid heartburn/Indigestion  Genitourinary: Negative for dysuria.  Musculoskeletal: Negative for joint swelling.  Skin: Negative for rash.  Neurological: Positive for headaches.  Hematological: Does not bruise/bleed easily.  Psychiatric/Behavioral: Negative for dysphoric mood. The patient is not nervous/anxious.        Objective:   Physical Exam        Assessment & Plan:

## 2017-01-24 NOTE — Progress Notes (Signed)
Past surgical history She  has a past surgical history that includes Myomectomy; IVC FILTER PLACEMENT ([place] HX) (2014); and Cholecystectomy (N/A, 04/05/2016).  Family history Her family history includes Diabetes in her mother; Hypertension in her mother; Stroke in her father.  Social history She  reports that she has never smoked. She has never used smokeless tobacco. She reports that she does not drink alcohol or use drugs.  Allergies  Allergen Reactions  . Amoxicillin Anaphylaxis  . Penicillins Anaphylaxis    Review of Systems  Constitutional: Positive for unexpected weight change. Negative for fever.  HENT: Positive for congestion, postnasal drip, rhinorrhea, sinus pain, sinus pressure, sneezing, sore throat and voice change. Negative for dental problem, ear pain, nosebleeds and trouble swallowing.   Eyes: Negative for redness and itching.  Respiratory: Positive for cough and shortness of breath. Negative for chest tightness and wheezing.   Cardiovascular: Positive for chest pain. Negative for palpitations and leg swelling.  Gastrointestinal: Positive for nausea. Negative for vomiting.       Acid heartburn/Indigestion  Genitourinary: Negative for dysuria.  Musculoskeletal: Negative for joint swelling.  Skin: Negative for rash.  Neurological: Positive for headaches.  Hematological: Does not bruise/bleed easily.  Psychiatric/Behavioral: Negative for dysphoric mood. The patient is not nervous/anxious.     Current Outpatient Prescriptions on File Prior to Visit  Medication Sig  . albuterol (PROVENTIL HFA;VENTOLIN HFA) 108 (90 Base) MCG/ACT inhaler Inhale 2 puffs into the lungs every 6 (six) hours as needed for wheezing or shortness of breath.  Marland Kitchen amLODipine (NORVASC) 10 MG tablet TAKE ONE TABLET BY MOUTH ONCE DAILY  . benzonatate (TESSALON) 200 MG capsule Take 1 capsule (200 mg total) by mouth 2 (two) times daily as needed for cough.  . Blood Pressure Monitoring (BLOOD PRESSURE  CUFF) MISC 1 Device by Does not apply route daily.  . Cetirizine HCl 10 MG CAPS Take 1 capsule (10 mg total) by mouth daily.  . cyclobenzaprine (FLEXERIL) 10 MG tablet Take 0.5 tablets (5 mg total) by mouth 3 (three) times daily as needed for muscle spasms.  . Diphenhyd-Hydrocort-Nystatin (FIRST-DUKES MOUTHWASH) SUSP Use as directed 5 mLs in the mouth or throat 4 (four) times daily. Rinse and spit  . gabapentin (NEURONTIN) 100 MG capsule Take 1-3 capsules (100-300 mg total) by mouth 2 (two) times daily.  Marland Kitchen lisinopril (PRINIVIL,ZESTRIL) 10 MG tablet TAKE ONE TABLET BY MOUTH ONCE DAILY  . naproxen sodium (ANAPROX) 550 MG tablet Take 550 mg by mouth as needed for mild pain.  Marland Kitchen omeprazole (PRILOSEC) 40 MG capsule Take 40 mg by mouth daily.  . promethazine-dextromethorphan (PROMETHAZINE-DM) 6.25-15 MG/5ML syrup Take 5 mLs by mouth 4 (four) times daily as needed for cough.  . Vitamin D, Ergocalciferol, (DRISDOL) 50000 units CAPS capsule Take 1 capsule (50,000 Units total) by mouth every 7 (seven) days.  Marland Kitchen linaclotide (LINZESS) 145 MCG CAPS capsule Take 145 mcg by mouth daily before breakfast.   No current facility-administered medications on file prior to visit.     Chief Complaint  Patient presents with  . PULMONARY CONSULT    Referred by [name], PA for PNA. Recent CXR 7/11 and 7/16. Cough present x 1.5 months - dry. Pt takes Lisinopril 10mg  x 1 year.    Pulmonary tests CT angio chest 12/21/16 >> lingular consolidation  Past medical history She  has a past medical history of Anemia; Arthritis; Asthma; DVT (deep venous thrombosis) ([place]); GERD (gastroesophageal reflux disease); Heart murmur; Hypertension; Miscarriage; Miscarriage (2013); Peripheral vascular disease (  [place]) (2014); Pneumonia; and Pulmonary embolism ([place]).  Vital signs BP 120/82 (BP Location: Left Wrist, Cuff Size: Normal)   Pulse 80   Ht 5\' 7"  (1.702 m)   Wt 268 lb 12.8 oz (121.9 kg)   SpO2 99%   BMI 42.10 kg/m    History of present illness Sheri Robinson is a 36 y.o. female with persistent cough and pulmonary infiltrate.  She developed a cough at the beginning of June 2018.  This was associated with sinus congestion and sore throat.  She was having fever up to 103F.  She went to ER and was found to have lingular infiltrate.  She was started on Abx.  She has done two courses of Abx, and 2 courses of prednisone.  She was having upset stomach and episodes of vomiting when her symptoms first started, but these have resolved.  Her fever resolved after second course of Abx.  She still has a cough and hoarseness.  She still has some sinus pressure as well, but this is better.  She was prescribed an albuterol inhaler, and this helps.  She had follow up CXR and this showed persistent lingular infiltrate.  She denies blurred vision, sweats, weight loss, skin rash, leg swelling, hemoptysis.  She is from [place].  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  She denies sick exposures, animal/bird exposures, or travel history.  She does not smoke cigarettes.  She denies history of TB.  She had several previous episodes of pneumonia.  Physical exam  General - No distress Eyes - pupils reactive, wears glasses ENT - No sinus tenderness, no oral exudate, no LAN, no thyromegaly, TM clear, pupils equal/reactive, raspy voice, 2+ tonsils Cardiac - s1s2 regular, no murmur, pulses symmetric Chest - No wheeze/rales/dullness, good air entry, normal respiratory excursion Back - No focal tenderness Abd - Soft, non-tender, no organomegaly, + bowel sounds Ext - No edema Neuro - Normal strength, cranial nerves intact Skin - No rashes Psych - Normal mood, and behavior   CMP Latest Ref Rng & Units 12/20/2016 08/28/2016 04/04/2016  Glucose 65 - 99 mg/dL 98 86 107(H)  BUN 6 - 20 mg/dL 8 9 6   Creatinine 0.44 - 1.00 mg/dL 1.02(H) 0.83 0.97  Sodium 135 - 145 mmol/L 135 141 138  Potassium 3.5 - 5.1 mmol/L 3.4(L) 4.3 3.4(L)   Chloride 101 - 111 mmol/L 100(L) 99 106  CO2 22 - 32 mmol/L 24 24 24   Calcium 8.9 - 10.3 mg/dL 9.1 8.9 9.0  Total Protein 6.0 - 8.5 g/dL - 7.6 -  Total Bilirubin 0.0 - 1.2 mg/dL - 0.3 -  Alkaline Phos 39 - 117 IU/L - 60 -  AST 0 - 40 IU/L - 13 -  ALT 0 - 32 IU/L - 8 -     CBC Latest Ref Rng & Units 01/08/2017 01/03/2017 01/01/2017  WBC 4.6 - 10.2 K/uL 7.9 12.2(A) 15.8(A)  Hemoglobin 12.2 - 16.2 g/dL 10.4(A) 10.5(A) 11.2(A)  Hematocrit 37.7 - 47.9 % 31.6(A) 32.4(A) 33.5(A)  Platelets 150 - 400 K/uL - - -     Discussion 36 yo female with persistent cough after recent treatment for lingular pneumonia.  She has history of allergies and likely has developed asthmatic bronchitis.  She reports benefit from albuterol therapy.  She still has residual sinus congestion, but has been intolerant of nose sprays.  She has persistent lingular infiltrate.  Clinically she seems to be improving from pneumonia and is no longer having fever.  I explained that  radiographic findings after pneumonia sometimes can take months to fully resolve.  She takes lisinopril for blood pressure, but has been on this for a while and her use of this predates the onset of her cough.   Assessment/plan  Asthmatic bronchitis. - add symbicort and singulair - continue prn albuterol  Cyclic cough. - chlorpheniramine 4 mg qhs until cough resolves - avoid forcing cough or throat clearing - salt water gargles - sip water with urge to cough - voice rest - singulair should help with this also  Persistent lingular infiltrate. - will arrange for repeat CT chest w/o contrast and compare to scan from June 2018 - if the infiltrate has not show improvement, she will then need to have bronchoscopy with airway sampling for culture, cell count, and cytology  ACE inhibitor use. - if her cough persists, then might need to consider changing her to alternate antihypertensive agent   Patient Instructions  Lab tests today Will  schedule CT chest Symbicort two puffs twice per day and rinse mouth after each use Singulair 10 mg pill nightly Chlorpheniramine 4 mg pill nightly Salt water gargle twice per day Sip water when you have urge to cough Use sugarless candy to keep your mouth moist Avoid throat clearing or forced cough  Follow up in 2 weeks with Dr. Halford Chessman or Nurse Practitioner    Chesley Mires, MD [facility] Pager:  [phone] 01/24/2017, 10:16 AM

## 2017-01-24 NOTE — Patient Instructions (Signed)
Lab tests today Will schedule CT chest Symbicort two puffs twice per day and rinse mouth after each use Singulair 10 mg pill nightly Chlorpheniramine 4 mg pill nightly Salt water gargle twice per day Sip water when you have urge to cough Use sugarless candy to keep your mouth moist Avoid throat clearing or forced cough  Follow up in 2 weeks with Dr. Halford Chessman or Nurse Practitioner

## 2017-01-24 NOTE — Telephone Encounter (Signed)
Pt is also requesting a med refill on her naproxen.  Please adv

## 2017-01-24 NOTE — Telephone Encounter (Signed)
Sent pt to Dr. Katherine Roan for sooner pulmonary appt 8/1

## 2017-01-25 ENCOUNTER — Other Ambulatory Visit: Payer: Self-pay

## 2017-01-25 ENCOUNTER — Ambulatory Visit (INDEPENDENT_AMBULATORY_CARE_PROVIDER_SITE_OTHER)
Admission: RE | Admit: 2017-01-25 | Discharge: 2017-01-25 | Disposition: A | Discharge status: Home / Self Care | Payer: BLUE CROSS/BLUE SHIELD | Source: Ambulatory Visit | Attending: Pulmonary Disease | Admitting: Pulmonary Disease

## 2017-01-25 ENCOUNTER — Encounter: Payer: Self-pay | Admitting: Physician Assistant

## 2017-01-25 ENCOUNTER — Telehealth: Payer: Self-pay | Admitting: Pulmonary Disease

## 2017-01-25 DIAGNOSIS — R918 Other nonspecific abnormal finding of lung field: Secondary | ICD-10-CM | POA: Diagnosis not present

## 2017-01-25 DIAGNOSIS — I10 Essential (primary) hypertension: Secondary | ICD-10-CM

## 2017-01-25 MED ORDER — LISINOPRIL 10 MG PO TABS: 10.0000 mg | ORAL_TABLET | Freq: Every day | ORAL | 0 refills | Status: DC

## 2017-01-25 NOTE — Telephone Encounter (Signed)
CMP Latest Ref Rng & Units 01/24/2017 12/20/2016 08/28/2016  Glucose 70 - 99 mg/dL 105(H) 98 86  BUN 6 - 23 mg/dL 12 8 9   Creatinine 0.40 - 1.20 mg/dL 1.00 1.02(H) 0.83  Sodium 135 - 145 mEq/L 138 135 141  Potassium 3.5 - 5.1 mEq/L 4.1 3.4(L) 4.3  Chloride 96 - 112 mEq/L 101 100(L) 99  CO2 19 - 32 mEq/L 29 24 24   Calcium 8.4 - 10.5 mg/dL 9.3 9.1 8.9  Total Protein 6.0 - 8.3 g/dL 7.5 - 7.6  Total Bilirubin 0.2 - 1.2 mg/dL 0.3 - 0.3  Alkaline Phos 39 - 117 U/L 53 - 60  AST 0 - 37 U/L 11 - 13  ALT 0 - 35 U/L 10 - 8    CBC Latest Ref Rng & Units 01/24/2017 01/08/2017 01/03/2017  WBC 4.0 - 10.5 K/uL 9.4 7.9 12.2(A)  Hemoglobin 12.0 - 15.0 g/dL 11.2(L) 10.4(A) 10.5(A)  Hematocrit 36.0 - 46.0 % 34.3(L) 31.6(A) 32.4(A)  Platelets 150.0 - 400.0 K/uL 291.0 - -    CT chest 01/25/17 >> decreased infiltrate Lingular lobe   Will have my nurse inform pt that labs were normal.  CT chest showed improved appearance of pneumonia in middle part of Lt lung.  She will need follow up chest xray at next visit to continue monitoring.  She does not need to have a bronchoscopy at this time.

## 2017-01-25 NOTE — Telephone Encounter (Signed)
I will be on the lookout for the [facility] paperwork. It appears that pt was seen yesterday by pulmonology, which is great! Thanks Deneise Lever!

## 2017-01-26 NOTE — Telephone Encounter (Signed)
Results have been explained to patient, pt expressed understanding. Nothing further needed.  

## 2017-02-02 ENCOUNTER — Encounter: Payer: Self-pay | Admitting: Physician Assistant

## 2017-02-02 ENCOUNTER — Ambulatory Visit (INDEPENDENT_AMBULATORY_CARE_PROVIDER_SITE_OTHER): Payer: BLUE CROSS/BLUE SHIELD | Admitting: Physician Assistant

## 2017-02-02 VITALS — BP 120/81 | HR 94 | Temp 98.7°F | Resp 17 | Ht 68.0 in | Wt 265.0 lb

## 2017-02-02 DIAGNOSIS — J189 Pneumonia, unspecified organism: Secondary | ICD-10-CM

## 2017-02-02 DIAGNOSIS — I10 Essential (primary) hypertension: Secondary | ICD-10-CM | POA: Insufficient documentation

## 2017-02-02 DIAGNOSIS — K219 Gastro-esophageal reflux disease without esophagitis: Secondary | ICD-10-CM | POA: Insufficient documentation

## 2017-02-02 DIAGNOSIS — Z79899 Other long term (current) drug therapy: Secondary | ICD-10-CM | POA: Diagnosis not present

## 2017-02-02 MED ORDER — OMEPRAZOLE 40 MG PO CPDR: 40.0000 mg | DELAYED_RELEASE_CAPSULE | Freq: Every day | ORAL | 0 refills | Status: DC

## 2017-02-02 NOTE — Patient Instructions (Addendum)
For GERD, take prilosec 40mg  daily for 6 weeks. After 6 weeks try to take as needed or every other day. Contact me and let me know how you are doing at this point. We may want to try and decrease it down to 20mg  for your next refill to see if you can be managed on this dose. Below is some other information to read about how to control GERD symptoms. Thank you for letting me participate in your health and well being.  Food Choices for Gastroesophageal Reflux Disease, Adult When you have gastroesophageal reflux disease (GERD), the foods you eat and your eating habits are very important. Choosing the right foods can help ease your discomfort. What guidelines do I need to follow?  Choose fruits, vegetables, whole grains, and low-fat dairy products.  Choose low-fat meat, fish, and poultry.  Limit fats such as oils, salad dressings, butter, nuts, and avocado.  Keep a food diary. This helps you identify foods that cause symptoms.  Avoid foods that cause symptoms. These may be different for everyone.  Eat small meals often instead of 3 large meals a day.  Eat your meals slowly, in a place where you are relaxed.  Limit fried foods.  Cook foods using methods other than frying.  Avoid drinking alcohol.  Avoid drinking large amounts of liquids with your meals.  Avoid bending over or lying down until 2-3 hours after eating. What foods are not recommended? These are some foods and drinks that may make your symptoms worse: Vegetables Tomatoes. Tomato juice. Tomato and spaghetti sauce. Chili peppers. Onion and garlic. Horseradish. Fruits Oranges, grapefruit, and lemon (fruit and juice). Meats High-fat meats, fish, and poultry. This includes hot dogs, ribs, ham, sausage, salami, and bacon. Dairy Whole milk and chocolate milk. Sour cream. Cream. Butter. Ice cream. Cream cheese. Drinks Coffee and tea. Bubbly (carbonated) drinks or energy drinks. Condiments Hot sauce. Barbecue  sauce. Sweets/Desserts Chocolate and cocoa. Donuts. Peppermint and spearmint. Fats and Oils High-fat foods. This includes [place] fries and potato chips. Other Vinegar. Strong spices. This includes black pepper, white pepper, red pepper, cayenne, curry powder, cloves, ginger, and chili powder. The items listed above may not be a complete list of foods and drinks to avoid. Contact your dietitian for more information. This information is not intended to replace advice given to you by your health care provider. Make sure you discuss any questions you have with your health care provider. Document Released: 12/12/2011 Document Revised: 11/18/2015 Document Reviewed: 04/16/2013 Elsevier Interactive Patient Education  2017 [employer].    IF you received an x-ray today, you will receive an invoice from [facility]. Please contact [facility] at [phone] with questions or concerns regarding your invoice.   IF you received labwork today, you will receive an invoice from [facility]. Please contact [facility] at [phone] with questions or concerns regarding your invoice.   Our billing staff will not be able to assist you with questions regarding bills from these companies.  You will be contacted with the lab results as soon as they are available. The fastest way to get your results is to activate your My Chart account. Instructions are located on the last page of this paperwork. If you have not heard from [place] regarding the results in 2 weeks, please contact this office.

## 2017-02-02 NOTE — Progress Notes (Signed)
Sheri Robinson  MRN: [id number] DOB: 08/21/1980  Subjective:   Sheri Robinson is an 36 y.o. female who presents for evaluation of heartburn. Pt has known dx of GERD x years and has been controlled on prilosec 40mg  daily. She ran out last week and has been having worsening acid reflux, regurgitation, and mild nausea. Of note, pt tries to avoid foods that worsen GERD. She also avoids lying down immediatly after eating. She denies abdominal bloating, belching, chest pain, choking on food, difficulty swallowing, dysphagia, early satiety, hematemesis, melena and midespigastric pain. Denies excessive NSAID use. Denies smoking and alcohol use. Medical therapy in the past has included: antacids, H2 antagonists and proton pump inhibitors.Has seen GI in the past and they gave her the Rx for priolosec. Has not had an endoscopy.   Also needs FMLA paperwork refilled out for pneumonia.  This was initially completed on 01/15/17 visit but apparently her office needs additional information. She has brought the paperwork in today and would like it filled out in office.  In terms of pneumonia, patient has seen pulmonology and has a follow up appointment next week. Still having mild cough and moderate laryngitis.   Review of Systems  Constitutional: Negative for chills, diaphoresis and fever.  Respiratory: Positive for cough.     Patient Active Problem List   Diagnosis Date Noted  . Essential hypertension 02/02/2017  . Community acquired pneumonia of left lung 12/26/2016  . Cough 12/26/2016  . Viral enteritis 12/05/2016  . Gallstones 04/05/2016    Current Outpatient Prescriptions on File Prior to Visit  Medication Sig Dispense Refill  . albuterol (PROVENTIL HFA;VENTOLIN HFA) 108 (90 Base) MCG/ACT inhaler Inhale 2 puffs into the lungs every 6 (six) hours as needed for wheezing or shortness of breath. 1 Inhaler 0  . amLODipine (NORVASC) 10 MG tablet TAKE ONE TABLET BY MOUTH ONCE DAILY 90 tablet 3  .  benzonatate (TESSALON) 200 MG capsule Take 1 capsule (200 mg total) by mouth 2 (two) times daily as needed for cough. 20 capsule 0  . Blood Pressure Monitoring (BLOOD PRESSURE CUFF) MISC 1 Device by Does not apply route daily. 1 each 0  . budesonide-formoterol (SYMBICORT) 160-4.5 MCG/ACT inhaler Inhale 2 puffs into the lungs 2 (two) times daily. 1 Inhaler 6  . Cetirizine HCl 10 MG CAPS Take 1 capsule (10 mg total) by mouth daily. 30 capsule 12  . chlorpheniramine (CHLOR-TRIMETON) 4 MG tablet Take 1 tablet (4 mg total) by mouth at bedtime. 14 tablet 0  . cyclobenzaprine (FLEXERIL) 10 MG tablet Take 0.5 tablets (5 mg total) by mouth 3 (three) times daily as needed for muscle spasms. 30 tablet 0  . Diphenhyd-Hydrocort-Nystatin (FIRST-DUKES MOUTHWASH) SUSP Use as directed 5 mLs in the mouth or throat 4 (four) times daily. Rinse and spit 237 mL 1  . gabapentin (NEURONTIN) 100 MG capsule Take 1-3 capsules (100-300 mg total) by mouth 2 (two) times daily. 90 capsule 3  . linaclotide (LINZESS) 145 MCG CAPS capsule Take 145 mcg by mouth daily before breakfast.    . lisinopril (PRINIVIL,ZESTRIL) 10 MG tablet Take 1 tablet (10 mg total) by mouth daily. 90 tablet 0  . montelukast (SINGULAIR) 10 MG tablet Take 1 tablet (10 mg total) by mouth at bedtime. 30 tablet 5  . naproxen sodium (ANAPROX) 550 MG tablet Take 550 mg by mouth as needed for mild pain.    . promethazine-dextromethorphan (PROMETHAZINE-DM) 6.25-15 MG/5ML syrup Take 5 mLs by mouth 4 (four) times daily as  needed for cough. 180 mL 0  . Vitamin D, Ergocalciferol, (DRISDOL) 50000 units CAPS capsule Take 1 capsule (50,000 Units total) by mouth every 7 (seven) days. 30 capsule 1   No current facility-administered medications on file prior to visit.     Allergies  Allergen Reactions  . Amoxicillin Anaphylaxis  . Penicillins Anaphylaxis      Social History   Social History  . Marital status: Single    Spouse name: N/A  . Number of children: N/A    . Years of education: N/A   Occupational History  . Customer Service    Social History Main Topics  . Smoking status: Never Smoker  . Smokeless tobacco: Never Used  . Alcohol use No  . Drug use: No  . Sexual activity: Yes   Other Topics Concern  . Not on file   Social History Narrative  . No narrative on file    Objective:  BP 120/81   Pulse 94   Temp 98.7 F (37.1 C) (Oral)   Resp 17   Ht 5\' 8"  (1.727 m)   Wt 265 lb (120.2 kg)   LMP 01/12/2017   SpO2 98%   BMI 40.29 kg/m   Physical Exam  Constitutional: She is oriented to person, place, and time and well-developed, well-nourished, and in no distress.  HENT:  Head: Normocephalic and atraumatic.  Eyes: Conjunctivae are normal.  Neck: Normal range of motion.  Cardiovascular: Normal rate, regular rhythm and normal heart sounds.   Pulmonary/Chest: Effort normal.  Neurological: She is alert and oriented to person, place, and time. Gait normal.  Skin: Skin is warm and dry.  Psychiatric: Affect normal.  Vitals reviewed.   Assessment and Plan :  1. Current use of proton pump inhibitor - Magnesium 2. Gastroesophageal reflux disease, esophagitis presence not specified Discussed risks of long term use of PPI. Would like pt to take this dose for 6 weeks, after that try to use prn. If cannot tolerate prn, we can try lowering dose to 20mg  daily. Pt understands and agrees to tx. Given educational material on GERD and food choices.  - omeprazole (PRILOSEC) 40 MG capsule; Take 1 capsule (40 mg total) by mouth daily.  Dispense: 90 capsule; Refill: 0  3. Community acquired pneumonia of left lung, unspecified part of lung FMLA paperwork completed in office and given to patient.   [name], PA-C  [facility] 02/02/2017 2:22 PM

## 2017-02-03 LAB — MAGNESIUM: Magnesium: 1.7 mg/dL (ref 1.6–2.3)

## 2017-02-07 ENCOUNTER — Ambulatory Visit (INDEPENDENT_AMBULATORY_CARE_PROVIDER_SITE_OTHER): Payer: BLUE CROSS/BLUE SHIELD | Admitting: Pulmonary Disease

## 2017-02-07 ENCOUNTER — Encounter: Payer: Self-pay | Admitting: Pulmonary Disease

## 2017-02-07 ENCOUNTER — Ambulatory Visit (INDEPENDENT_AMBULATORY_CARE_PROVIDER_SITE_OTHER)
Admission: RE | Admit: 2017-02-07 | Discharge: 2017-02-07 | Disposition: A | Discharge status: Home / Self Care | Payer: BLUE CROSS/BLUE SHIELD | Source: Ambulatory Visit | Attending: Pulmonary Disease | Admitting: Pulmonary Disease

## 2017-02-07 VITALS — BP 122/82 | HR 71 | Ht 67.0 in | Wt 267.4 lb

## 2017-02-07 DIAGNOSIS — J45909 Unspecified asthma, uncomplicated: Secondary | ICD-10-CM | POA: Diagnosis not present

## 2017-02-07 DIAGNOSIS — R0602 Shortness of breath: Secondary | ICD-10-CM | POA: Diagnosis not present

## 2017-02-07 DIAGNOSIS — J189 Pneumonia, unspecified organism: Secondary | ICD-10-CM

## 2017-02-07 DIAGNOSIS — R918 Other nonspecific abnormal finding of lung field: Secondary | ICD-10-CM | POA: Diagnosis not present

## 2017-02-07 NOTE — Patient Instructions (Addendum)
1.  Continue to work on voice rest, cough suppression 2.  Continue your Symbicort as prescribed, rinse your mouth after each use  3.  Your chest xray is clear today!  Glad you are feeling better! 4.  We will order pulmonary function testing to review for asthma.  You will need to follow up with Dr. Halford Chessman after your PFT's are completed.  5.  Call if you have new or worsening symptoms.  6.  Contact [employer] if you need information for your FMLA paperwork.

## 2017-02-07 NOTE — Progress Notes (Signed)
[facility]   Chief Complaint  Patient presents with  . Follow-up    pt states breathing has slightly improved since last OV. pt reports sob with exertion, non prod cough, occ wheezing, chest tightness & voice hoarseness     Primary Pulmonologist: Dr. Halford Chessman   Current Outpatient Prescriptions on File Prior to Visit  Medication Sig  . albuterol (PROVENTIL HFA;VENTOLIN HFA) 108 (90 Base) MCG/ACT inhaler Inhale 2 puffs into the lungs every 6 (six) hours as needed for wheezing or shortness of breath.  Marland Kitchen amLODipine (NORVASC) 10 MG tablet TAKE ONE TABLET BY MOUTH ONCE DAILY  . benzonatate (TESSALON) 200 MG capsule Take 1 capsule (200 mg total) by mouth 2 (two) times daily as needed for cough.  . Blood Pressure Monitoring (BLOOD PRESSURE CUFF) MISC 1 Device by Does not apply route daily.  . budesonide-formoterol (SYMBICORT) 160-4.5 MCG/ACT inhaler Inhale 2 puffs into the lungs 2 (two) times daily.  . Cetirizine HCl 10 MG CAPS Take 1 capsule (10 mg total) by mouth daily.  . chlorpheniramine (CHLOR-TRIMETON) 4 MG tablet Take 1 tablet (4 mg total) by mouth at bedtime.  . cyclobenzaprine (FLEXERIL) 10 MG tablet Take 0.5 tablets (5 mg total) by mouth 3 (three) times daily as needed for muscle spasms.  . Diphenhyd-Hydrocort-Nystatin (FIRST-DUKES MOUTHWASH) SUSP Use as directed 5 mLs in the mouth or throat 4 (four) times daily. Rinse and spit  . gabapentin (NEURONTIN) 100 MG capsule Take 1-3 capsules (100-300 mg total) by mouth 2 (two) times daily.  Marland Kitchen linaclotide (LINZESS) 145 MCG CAPS capsule Take 145 mcg by mouth daily before breakfast.  . lisinopril (PRINIVIL,ZESTRIL) 10 MG tablet Take 1 tablet (10 mg total) by mouth daily.  . montelukast (SINGULAIR) 10 MG tablet Take 1 tablet (10 mg total) by mouth at bedtime.  . naproxen sodium (ANAPROX) 550 MG tablet Take 550 mg by mouth as needed for mild pain.  Marland Kitchen omeprazole (PRILOSEC) 40 MG capsule Take 1 capsule (40 mg total) by mouth daily.  .  promethazine-dextromethorphan (PROMETHAZINE-DM) 6.25-15 MG/5ML syrup Take 5 mLs by mouth 4 (four) times daily as needed for cough.  . Vitamin D, Ergocalciferol, (DRISDOL) 50000 units CAPS capsule Take 1 capsule (50,000 Units total) by mouth every 7 (seven) days.   No current facility-administered medications on file prior to visit.      Studies: CT Chest 8/2 >> partial but incomplete clearing of consolidation in the inferior lingula CXR 8/15 >> images reviewed with resolution of PNA PFT (ordered 8/15) >>   Past Medical Hx:  has a past medical history of Anemia; Arthritis; Asthma; DVT (deep venous thrombosis) ([place]); GERD (gastroesophageal reflux disease); Heart murmur; Hypertension; Miscarriage; Miscarriage (2013); Peripheral vascular disease ([place]) (2014); Pneumonia; and Pulmonary embolism ([place]).   Past Surgical hx, Allergies, Family hx, Social hx all reviewed.  Vital Signs BP 122/82 (BP Location: Left Arm, Cuff Size: Normal)   Pulse 71   Ht 5\' 7"  (1.702 m)   Wt 267 lb 6.4 oz (121.3 kg)   LMP 01/12/2017   SpO2 98%   BMI 41.88 kg/m   History of Present Illness Sheri Robinson is a 36 y.o. female with a history of cough and pulmonary infiltrate who presented to the pulmonary office for follow up of PNA.    She was seen by Dr. Halford Chessman on 8/1 for the same.  She initially developed symptoms in June 2018 when she developed sinus congestion, sore throat, fever to 103.  She completed two courses of antibiotics and  prednisone (x1).  CXR showed persistent lingular infiltrate.  Prior visit review indicates improvement in symptoms.  She was instructed to take Singulair, symbicort and cough suppression techniques.  CT of the chest was completed which demonstrated a partial but incomplete clearing of consolidation in the inferior lingula compared to prior CT imaging, resolution of pleural effusion, no adenopathy  GERD - takes prilosec, rx renewed > had ran out prilosec for one week, worse when lying  down, throat burns and sore.  Feels like this is why she is hoarse.    Asthmatic Bronchitis - never dx with asthma in past  Pt reports she is still hoarse from PNA.  Has been told she was suspected to have asthma.  She states she has had PNA every year since 2013.  Cough has decreased, no fevers, chills.    Since last visit she has continued to have a dry cough & hoarse voice but overall feels better.  She has filed for [employer].     Physical Exam  General - well developed adult F in no acute distress ENT - No sinus tenderness, no oral exudate, no LAN.  Voice hoarse.   Cardiac - s1s2 regular, no murmur Chest - even/non-labored, lungs bilaterally clear. No wheeze/rales Back - No focal tenderness Abd - Soft, non-tender Ext - No edema Neuro - Normal strength Skin - No rashes Psych - normal mood, and behavior   Assessment/Plan  Discussion:  36 year old female with PNA (11/2016) in th left lingula.  Symptoms resolving as well as radiographic clearance.  She continues to have a hoarse voice, likely aggravated by running out of her prilosec.     Pneumonia   Cough / Hoarseness - cough predates lisinopril   Asthmatic Bronchitis   GERD  Plan: Continue cough suppression > reinforced stopping cough, jolly ranchers / no menthol Continue prilosec Assess PFT to evaluate for asthma component  Continue Symbicort, discussed rinsing mouth after use   Patient Instructions  1.  Continue to work on voice rest, cough suppression 2.  Continue your Symbicort as prescribed, rinse your mouth after each use  3.  Your chest xray is clear today!  Glad you are feeling better! 4.  We will order pulmonary function testing to review for asthma.  You will need to follow up with Dr. Halford Chessman after your PFT's are completed.  5.  Call if you have new or worsening symptoms.  6.  Contact [employer] if you need information for your FMLA paperwork.       Noe Gens, NP-C [facility]   [phone] 02/07/2017, 8:34 PM

## 2017-02-11 NOTE — Progress Notes (Signed)
I have reviewed and agree with assessment/plan.  Yatzari Jonsson, MD  Pulmonary/Critical Care 02/11/2017, 11:03 AM Pager:  [phone]  

## 2017-02-13 ENCOUNTER — Encounter: Payer: Self-pay | Admitting: Physician Assistant

## 2017-02-19 ENCOUNTER — Encounter: Payer: Self-pay | Admitting: Oncology

## 2017-02-27 ENCOUNTER — Telehealth: Payer: Self-pay | Admitting: Family Medicine

## 2017-02-27 NOTE — Telephone Encounter (Signed)
Dr Unk Lightning is a provider for [employer] and needs to do a Peer to Peer with [name] for this patients disability claim.  His cell phone number is [phone]. Please call him as soon as you can about this case.

## 2017-02-28 ENCOUNTER — Telehealth: Payer: Self-pay | Admitting: Pulmonary Disease

## 2017-02-28 NOTE — Telephone Encounter (Signed)
VS please advise- Dr. Lorel Monaco is wishing to discuss this pt's disability case with you.  Callback #- [phone].  Thanks!

## 2017-03-01 ENCOUNTER — Telehealth: Payer: Self-pay | Admitting: Pulmonary Disease

## 2017-03-01 ENCOUNTER — Telehealth: Payer: Self-pay | Admitting: Family Medicine

## 2017-03-01 NOTE — Telephone Encounter (Signed)
VS is working in hospital again today.   VS please advise.  Thanks!

## 2017-03-01 NOTE — Telephone Encounter (Signed)
Dr. Lorel Monaco is calling back to speak with Dr. Halford Chessman. His number is [phone]

## 2017-03-01 NOTE — Telephone Encounter (Signed)
Dr Unk Lightning  Is calling Timmothy Euler about pt disability  Please call at [phone] he has called three times this weel I advised him that provider is sick

## 2017-03-02 ENCOUNTER — Ambulatory Visit (HOSPITAL_BASED_OUTPATIENT_CLINIC_OR_DEPARTMENT_OTHER): Payer: BLUE CROSS/BLUE SHIELD | Admitting: Oncology

## 2017-03-02 ENCOUNTER — Telehealth: Payer: Self-pay | Admitting: Oncology

## 2017-03-02 VITALS — BP 115/70 | HR 86 | Temp 98.3°F | Resp 19 | Ht 67.0 in | Wt 273.1 lb

## 2017-03-02 DIAGNOSIS — D649 Anemia, unspecified: Secondary | ICD-10-CM

## 2017-03-02 NOTE — Telephone Encounter (Signed)
Scheduled appts per 9/7 los. Did not want avs or calendar.

## 2017-03-02 NOTE — Telephone Encounter (Signed)
Routed to PA [place] at this time, as clinical staff can not do a P2P review.

## 2017-03-02 NOTE — Progress Notes (Signed)
Reason for Referral: Anemia.   HPI: 36 year old woman native of [place] but has been living in this area for the last 2 years. She is a pleasant woman with history of obesity, deep vein thrombosis in 2014 and recent diagnosis of pneumonia. She is recovering well from her pneumonia and have resumed most activities of daily living and she will return to work next week. She was noted due to be mildly anemic with recent hemoglobin of 11.2 on 01/24/2017. Her white cell count and platelet count were normal. Her differential is normal. Her hemoglobin electrophoresis obtained in October 2017 showed 97.2 hemoglobin A with normal A2 and hemoglobin F. No evidence of sickle cell trait noted. Her iron studies are also normal at the time. Her hemoglobin has fluctuated to between normal range and 10.5 without needing any intervention. She has been told that she has anemia since 2013. Clinically, she does report symptoms of mild fatigue and tiredness but otherwise no chest pain or palpitation. She denied any hematochezia or melena. She does have regular menstrual cycles that are not heavy. She denied any menorrhagia. She denied any GI symptoms.  She does not report any headaches, blurry vision, syncope or seizures. She does not report any fevers, chills or sweats. She is not report any cough, wheezing or hemoptysis. She is not report any chest pain, palpitation. She does not report any nausea, vomiting or abdominal pain. She does not report any frequency urgency or hesitancy. Remaining review of systems unremarkable.   Past Medical History:  Diagnosis Date  . Anemia   . Arthritis   . Asthma   . DVT (deep venous thrombosis) ([place])   . GERD (gastroesophageal reflux disease)   . Heart murmur    echo done 2014-[facility]- neg  . Hypertension   . Miscarriage   . Miscarriage 2013   had fibroids- lost blood- 3 tranfusions- developed dvt, pe 2014  . Peripheral vascular disease ([place]) 2014   pe, dvt   left leg   rx          . Pneumonia   . Pulmonary embolism ([place])   :  Past Surgical History:  Procedure Laterality Date  . CHOLECYSTECTOMY N/A 04/05/2016   Procedure: LAPAROSCOPIC CHOLECYSTECTOMY WITH INTRAOPERATIVE CHOLANGIOGRAM;  Surgeon: Autumn Messing III, MD;  Location: [facility];  Service: General;  Laterality: N/A;  . IVC FILTER PLACEMENT ([place] [facility]X)  2014   removed 5 2014  . MYOMECTOMY    :   Current Outpatient Prescriptions:  .  albuterol (PROVENTIL HFA;VENTOLIN HFA) 108 (90 Base) MCG/ACT inhaler, Inhale 2 puffs into the lungs every 6 (six) hours as needed for wheezing or shortness of breath., Disp: 1 Inhaler, Rfl: 0 .  amLODipine (NORVASC) 10 MG tablet, TAKE ONE TABLET BY MOUTH ONCE DAILY, Disp: 90 tablet, Rfl: 3 .  benzonatate (TESSALON) 200 MG capsule, Take 1 capsule (200 mg total) by mouth 2 (two) times daily as needed for cough., Disp: 20 capsule, Rfl: 0 .  Blood Pressure Monitoring (BLOOD PRESSURE CUFF) MISC, 1 Device by Does not apply route daily., Disp: 1 each, Rfl: 0 .  budesonide-formoterol (SYMBICORT) 160-4.5 MCG/ACT inhaler, Inhale 2 puffs into the lungs 2 (two) times daily., Disp: 1 Inhaler, Rfl: 6 .  Cetirizine HCl 10 MG CAPS, Take 1 capsule (10 mg total) by mouth daily., Disp: 30 capsule, Rfl: 12 .  chlorpheniramine (CHLOR-TRIMETON) 4 MG tablet, Take 1 tablet (4 mg total) by mouth at bedtime., Disp: 14 tablet, Rfl: 0 .  cyclobenzaprine (FLEXERIL) 10 MG tablet, Take 0.5 tablets (5 mg total) by mouth 3 (three) times daily as needed for muscle spasms., Disp: 30 tablet, Rfl: 0 .  Diphenhyd-Hydrocort-Nystatin (FIRST-DUKES MOUTHWASH) SUSP, Use as directed 5 mLs in the mouth or throat 4 (four) times daily. Rinse and spit, Disp: 237 mL, Rfl: 1 .  gabapentin (NEURONTIN) 100 MG capsule, Take 1-3 capsules (100-300 mg total) by mouth 2 (two) times daily., Disp: 90 capsule, Rfl: 3 .  linaclotide (LINZESS) 145 MCG CAPS capsule, Take 145 mcg by mouth daily before breakfast., Disp: , Rfl:  .   lisinopril (PRINIVIL,ZESTRIL) 10 MG tablet, Take 1 tablet (10 mg total) by mouth daily., Disp: 90 tablet, Rfl: 0 .  montelukast (SINGULAIR) 10 MG tablet, Take 1 tablet (10 mg total) by mouth at bedtime., Disp: 30 tablet, Rfl: 5 .  naproxen sodium (ANAPROX) 550 MG tablet, Take 550 mg by mouth as needed for mild pain., Disp: , Rfl:  .  omeprazole (PRILOSEC) 40 MG capsule, Take 1 capsule (40 mg total) by mouth daily., Disp: 90 capsule, Rfl: 0 .  promethazine-dextromethorphan (PROMETHAZINE-DM) 6.25-15 MG/5ML syrup, Take 5 mLs by mouth 4 (four) times daily as needed for cough., Disp: 180 mL, Rfl: 0 .  Vitamin D, Ergocalciferol, (DRISDOL) 50000 units CAPS capsule, Take 1 capsule (50,000 Units total) by mouth every 7 (seven) days., Disp: 30 capsule, Rfl: 1:  Allergies  Allergen Reactions  . Amoxicillin Anaphylaxis  . Penicillins Anaphylaxis  :  Family History  Problem Relation Age of Onset  . Hypertension Mother   . Diabetes Mother   . Stroke Father   :  Social History   Social History  . Marital status: Single    Spouse name: N/A  . Number of children: N/A  . Years of education: N/A   Occupational History  . Customer Service    Social History Main Topics  . Smoking status: Never Smoker  . Smokeless tobacco: Never Used  . Alcohol use No  . Drug use: No  . Sexual activity: Yes   Other Topics Concern  . Not on file   Social History Narrative  . No narrative on file  :  Pertinent items are noted in HPI.  Exam: Blood pressure 115/70, pulse 86, temperature 98.3 F (36.8 C), temperature source Oral, resp. rate 19, height 5\' 7"  (1.702 m), weight 273 lb 1.6 oz (123.9 kg), SpO2 100 %. General appearance: alert and cooperative appeared without distress. Throat: No oral thrush or ulcers. Neck: no adenopathy Back: negative Resp: clear to auscultation bilaterally Chest wall: no tenderness Cardio: regular rate and rhythm, S1, S2 normal, no murmur, click, rub or gallop GI: soft,  non-tender; bowel sounds normal; no masses,  no organomegaly Extremities: extremities normal, atraumatic, no cyanosis or edema Skin: Skin color, texture, turgor normal. No rashes or lesions  CBC    Component Value Date/Time   WBC 9.4 01/24/2017 1027   RBC 3.93 01/24/2017 1027   HGB 11.2 (L) 01/24/2017 1027   HGB 11.0 (L) 08/28/2016 1531   HCT 34.3 (L) 01/24/2017 1027   HCT 32.9 (L) 08/28/2016 1531   PLT 291.0 01/24/2017 1027   PLT 292 08/28/2016 1531   MCV 87.1 01/24/2017 1027   MCV 84.2 01/08/2017 1618   MCV 87 08/28/2016 1531   MCH 27.7 01/08/2017 1618   MCH 28.0 12/20/2016 2359   MCHC 32.5 01/24/2017 1027   RDW 14.5 01/24/2017 1027   RDW 14.9 08/28/2016 1531   LYMPHSABS 1.9 01/24/2017 1027  LYMPHSABS 1.8 08/28/2016 1531   MONOABS 0.8 01/24/2017 1027   EOSABS 0.1 01/24/2017 1027   EOSABS 0.1 08/28/2016 1531   BASOSABS 0.1 01/24/2017 1027   BASOSABS 0.0 08/28/2016 1531     Dg Chest 2 View  Result Date: 02/07/2017 CLINICAL DATA:  Cough and congestion. Shortness of breath. Recent pneumonia EXAM: CHEST  2 VIEW COMPARISON:  Chest radiograph January 08, 2017 and chest CT January 25, 2017. FINDINGS: The previously noted infiltrate in the lingula is no longer evident. Currently lungs are clear. Heart size and pulmonary vascularity are normal. No adenopathy. No bone lesions. IMPRESSION: Lungs now clear.  Cardiac silhouette within normal limits. Electronically Signed   By: Lowella Grip III M.D.   On: 02/07/2017 11:02    Assessment and Plan:   36 year old woman with the following issues:  1. Normocytic, normochromic anemia that is chronic in nature. Her hemoglobin average close to normal range and in August 2018 was 11.2. She had normal workup including iron studies and hemoglobin electrophoresis. She is asymptomatic at this time from these findings and her hemoglobin is rather mild.  The differential diagnosis was discussed today with the patient. Given the mild degree of her  anemia, I doubt that this is a sign of a serious hematological disorder. It is possible that she has multifactorial components such as chronic disease among others. Leukemia, lymphoma and myelodysplastic syndrome are considered less likely. Her white cell count and differential as well as platelets all normal. She does have a family history of chronic anemia which could also be affecting her.  I recommended continued observation and surveillance and repeat his CBC in 4 months. I recommend repeating erythropoietin level as well as repeat iron studies to ensure no evidence of iron deficiency. No need for any transfusion or growth factor support at this time.  2. Age-appropriate cancer screening. No recommended that she continue age-appropriate cancer screening and she is up-to-date.  3. Follow-up: Will be in 4 months to repeat his CBC.

## 2017-03-03 ENCOUNTER — Other Ambulatory Visit: Payer: Self-pay | Admitting: Family Medicine

## 2017-03-05 ENCOUNTER — Telehealth: Payer: Self-pay

## 2017-03-05 NOTE — Telephone Encounter (Signed)
Please advise 

## 2017-03-05 NOTE — Telephone Encounter (Signed)
Patient would like a refill on Flexeril 10mg . Patient would like it sent to [facility] on [address].

## 2017-03-05 NOTE — Telephone Encounter (Signed)
Called Dr. Unk Lightning, a disability examiner with [employer]. He states to help approve the pt's disability case Dr. Halford Chessman can call Dr. Unk Lightning. This is not required and Dr. Halford Chessman does not have to call back if he does not feel this is necessary. I questioned if Dr. Lorel Monaco needs to be called as well (see phone note 9.5.2018), Dr. Unk Lightning states he is not privy to that information and could not advise.   Dr. Halford Chessman please advise. Thanks.

## 2017-03-08 MED ORDER — CYCLOBENZAPRINE HCL 10 MG PO TABS: 5.0000 mg | ORAL_TABLET | Freq: Three times a day (TID) | ORAL | 0 refills | Status: DC | PRN

## 2017-03-08 NOTE — Telephone Encounter (Signed)
Please call pt and let her know I have placed this order. Thanks!   Meds ordered this encounter  Medications  . cyclobenzaprine (FLEXERIL) 10 MG tablet    Sig: Take 0.5 tablets (5 mg total) by mouth 3 (three) times daily as needed for muscle spasms.    Dispense:  30 tablet    Refill:  0    Order Specific Question:   Supervising Provider    Answer:   Wardell Honour [[id number]]

## 2017-03-08 NOTE — Telephone Encounter (Signed)
VS please advise. Thanks! 

## 2017-03-12 ENCOUNTER — Telehealth: Payer: Self-pay | Admitting: Physician Assistant

## 2017-03-12 ENCOUNTER — Encounter: Payer: Self-pay | Admitting: Physician Assistant

## 2017-03-12 NOTE — Telephone Encounter (Signed)
Patient faxed over a letter and blank FMLA forms for [place] to complete. I did not complete the forms because it appears that the way they need to be finished was discussed during her last OV with you so I will place the blank forms in your box on 03/12/17 please return them to the FMLA/Disability box at the 102 checkout desk within 5-7 business days. Thank you!

## 2017-03-13 NOTE — Telephone Encounter (Signed)
Pt picked up and feels better with med.

## 2017-03-13 NOTE — Telephone Encounter (Signed)
I returned this phone call. Dr. Raynaldo Opitz states the case has already been sent out and he will contact me again if he has further questions.

## 2017-03-14 NOTE — Telephone Encounter (Signed)
VS please advise of update.

## 2017-03-16 ENCOUNTER — Ambulatory Visit (INDEPENDENT_AMBULATORY_CARE_PROVIDER_SITE_OTHER): Payer: BLUE CROSS/BLUE SHIELD | Admitting: Physician Assistant

## 2017-03-16 ENCOUNTER — Encounter: Payer: Self-pay | Admitting: Physician Assistant

## 2017-03-16 VITALS — BP 118/77 | HR 103 | Temp 98.3°F | Resp 20 | Ht 66.85 in | Wt 268.4 lb

## 2017-03-16 DIAGNOSIS — Z23 Encounter for immunization: Secondary | ICD-10-CM

## 2017-03-16 DIAGNOSIS — Z008 Encounter for other general examination: Secondary | ICD-10-CM

## 2017-03-16 DIAGNOSIS — Z0189 Encounter for other specified special examinations: Secondary | ICD-10-CM

## 2017-03-16 NOTE — Patient Instructions (Addendum)
  We should have your lab results within 3-5 days. Once I have them, I will fill out your paperwork and fax it to the desired number. Thank you for letting me participate in your health and well being.    IF you received an x-ray today, you will receive an invoice from [facility]. Please contact [facility] at [phone] with questions or concerns regarding your invoice.   IF you received labwork today, you will receive an invoice from [facility]. Please contact [facility] at [phone] with questions or concerns regarding your invoice.   Our billing staff will not be able to assist you with questions regarding bills from these companies.  You will be contacted with the lab results as soon as they are available. The fastest way to get your results is to activate your My Chart account. Instructions are located on the last page of this paperwork. If you have not heard from [place] regarding the results in 2 weeks, please contact this office.

## 2017-03-16 NOTE — Progress Notes (Signed)
SLOAN GALENTINE  MRN: [id number] DOB: 1980/09/11  Subjective:  Sheri Robinson is a 36 y.o. female seen in office today for a chief complaint of need of biometric screening for insurance. Pt is fasting today. She would also like me to fill out her temporary disability forms for pneumonia.  I initially completed FMLA on 01/15/17 visit but she now needs temporary disability forms completed for her follow up appointments with pulmonology.  In terms of pneumonia, CXR of 02/07/17 showed cleared lungs. 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 Notes her voice came completley back about a week ago. Has follow up with pulmonology in November.    Review of Systems  Constitutional: Negative for chills, diaphoresis and fever.  Respiratory: Negative for cough.     Patient Active Problem List   Diagnosis Date Noted  . Essential hypertension 02/02/2017  . Gastroesophageal reflux disease 02/02/2017  . Community acquired pneumonia of left lung 12/26/2016  . Cough 12/26/2016  . Viral enteritis 12/05/2016  . Gallstones 04/05/2016    Current Outpatient Prescriptions on File Prior to Visit  Medication Sig Dispense Refill  . albuterol (PROVENTIL HFA;VENTOLIN HFA) 108 (90 Base) MCG/ACT inhaler Inhale 2 puffs into the lungs every 6 (six) hours as needed for wheezing or shortness of breath. 1 Inhaler 0  . amLODipine (NORVASC) 10 MG tablet TAKE ONE TABLET BY MOUTH ONCE DAILY 90 tablet 3  . benzonatate (TESSALON) 200 MG capsule Take 1 capsule (200 mg total) by mouth 2 (two) times daily as needed for cough. 20 capsule 0  . Blood Pressure Monitoring (BLOOD PRESSURE CUFF) MISC 1 Device by Does not apply route daily. 1 each 0  . budesonide-formoterol (SYMBICORT) 160-4.5 MCG/ACT inhaler Inhale 2 puffs into the lungs 2 (two) times daily. 1 Inhaler 6  . Cetirizine HCl 10 MG CAPS Take 1 capsule (10 mg total) by  mouth daily. 30 capsule 12  . chlorpheniramine (CHLOR-TRIMETON) 4 MG tablet Take 1 tablet (4 mg total) by mouth at bedtime. 14 tablet 0  . cyclobenzaprine (FLEXERIL) 10 MG tablet Take 0.5 tablets (5 mg total) by mouth 3 (three) times daily as needed for muscle spasms. 30 tablet 0  . gabapentin (NEURONTIN) 100 MG capsule Take 1-3 capsules (100-300 mg total) by mouth 2 (two) times daily. 90 capsule 3  . lisinopril (PRINIVIL,ZESTRIL) 10 MG tablet Take 1 tablet (10 mg total) by mouth daily. 90 tablet 0  . montelukast (SINGULAIR) 10 MG tablet Take 1 tablet (10 mg total) by mouth at bedtime. 30 tablet 5  . naproxen sodium (ANAPROX) 550 MG tablet Take 550 mg by mouth as needed for mild pain.    Marland Kitchen omeprazole (PRILOSEC) 40 MG capsule Take 1 capsule (40 mg total) by mouth daily. 90 capsule 0  . Vitamin D, Ergocalciferol, (DRISDOL) 50000 units CAPS capsule Take 1 capsule (50,000 Units total) by mouth every 7 (seven) days. 30 capsule 1  . Diphenhyd-Hydrocort-Nystatin (FIRST-DUKES MOUTHWASH) SUSP Use as directed 5 mLs in the mouth or throat 4 (four) times daily. Rinse and spit (Patient not taking: Reported on 03/16/2017) 237 mL 1  . linaclotide (LINZESS) 145 MCG CAPS capsule Take 145 mcg by mouth daily before breakfast.    . promethazine-dextromethorphan (PROMETHAZINE-DM) 6.25-15 MG/5ML syrup Take 5 mLs by mouth 4 (four) times daily as needed for cough. (Patient not taking: Reported on 03/16/2017) 180 mL 0  No current facility-administered medications on file prior to visit.     Allergies  Allergen Reactions  . Amoxicillin Anaphylaxis  . Penicillins Anaphylaxis     Objective:  BP 118/77 (BP Location: Right Arm, Patient Position: Sitting, Cuff Size: Large)   Pulse (!) 103   Temp 98.3 F (36.8 C) (Oral)   Resp 20   Ht 5' 6.85" (1.698 m)   Wt 268 lb 6.4 oz (121.7 kg)   LMP 03/08/2017 (Approximate)   SpO2 98%   BMI 42.23 kg/m   Physical Exam  Constitutional: She is oriented to person, place, and  time and well-developed, well-nourished, and in no distress.  No vocal raspiness noted.   HENT:  Head: Normocephalic and atraumatic.  Eyes: Conjunctivae are normal.  Neck: Normal range of motion.  Cardiovascular: Normal rate, regular rhythm and normal heart sounds.   Pulmonary/Chest: Effort normal and breath sounds normal. She has no wheezes. She has no rales.  Neurological: She is alert and oriented to person, place, and time. Gait normal.  Skin: Skin is warm and dry.  Psychiatric: Affect normal.  Vitals reviewed.   Assessment and Plan :  1. Encounter for biometric screening Labs pending. Biometric forms have been placed in my provider box and I will fill them out once results return and place them in the "to be faxed" box. I have also completed FMLA forms and will lace them in the FMLA/Disability box.  - CMP14+EGFR - Lipid panel  2. Need for influenza vaccination - Flu Vaccine QUAD 6+ mos PF IM (Fluarix Quad PF)  [name], PA-C  [facility] 03/17/2017 12:07 AM

## 2017-03-17 LAB — CMP14+EGFR
ALBUMIN: 4.4 g/dL (ref 3.5–5.5)
ALT: 10 IU/L (ref 0–32)
AST: 13 IU/L (ref 0–40)
Albumin/Globulin Ratio: 1.3 (ref 1.2–2.2)
Alkaline Phosphatase: 63 IU/L (ref 39–117)
BUN / CREAT RATIO: 9 (ref 9–23)
BUN: 8 mg/dL (ref 6–20)
Bilirubin Total: 0.3 mg/dL (ref 0.0–1.2)
CALCIUM: 9.5 mg/dL (ref 8.7–10.2)
CO2: 23 mmol/L (ref 20–29)
CREATININE: 0.87 mg/dL (ref 0.57–1.00)
Chloride: 101 mmol/L (ref 96–106)
GFR, EST AFRICAN AMERICAN: 98 mL/min/{1.73_m2} (ref >59)
GFR, EST NON AFRICAN AMERICAN: 85 mL/min/{1.73_m2} (ref >59)
GLOBULIN, TOTAL: 3.4 g/dL (ref 1.5–4.5)
GLUCOSE: 84 mg/dL (ref 65–99)
Potassium: 4 mmol/L (ref 3.5–5.2)
SODIUM: 140 mmol/L (ref 134–144)
TOTAL PROTEIN: 7.8 g/dL (ref 6.0–8.5)

## 2017-03-17 LAB — LIPID PANEL
CHOL/HDL RATIO: 3.1 ratio (ref 0.0–4.4)
CHOLESTEROL TOTAL: 195 mg/dL (ref 100–199)
HDL: 62 mg/dL (ref >39)
LDL Calculated: 113 mg/dL — ABNORMAL HIGH (ref 0–99)
Triglycerides: 102 mg/dL (ref 0–149)
VLDL Cholesterol Cal: 20 mg/dL (ref 5–40)

## 2017-03-17 NOTE — Telephone Encounter (Signed)
I have completed the forms and placed them in the [facility] box.

## 2017-03-19 NOTE — Telephone Encounter (Signed)
Paperwork scanned and faxed on 03/19/17

## 2017-03-20 ENCOUNTER — Telehealth: Payer: Self-pay | Admitting: Physician Assistant

## 2017-03-20 ENCOUNTER — Encounter: Payer: Self-pay | Admitting: Physician Assistant

## 2017-03-20 NOTE — Telephone Encounter (Signed)
Contacted patient about form I am supposed to fill out. Someone has removed the documents from my box and that form was in there. I have asked her to bring another copy to our office so I can fill it out with her lab results today.

## 2017-03-21 ENCOUNTER — Encounter: Payer: Self-pay | Admitting: Physician Assistant

## 2017-03-21 NOTE — Telephone Encounter (Signed)
VS have you spoken with this doctor?

## 2017-03-27 NOTE — Telephone Encounter (Signed)
Spoke with independent pulmonologist working with [employer].  No additional information needed.

## 2017-03-27 NOTE — Telephone Encounter (Signed)
VS please advise if this encounter can be closed.  Thanks!

## 2017-03-27 NOTE — Telephone Encounter (Signed)
Spoke with Dr. Lorel Monaco.  No additional information needed at this time.

## 2017-05-08 ENCOUNTER — Encounter: Payer: Self-pay | Admitting: Physician Assistant

## 2017-05-08 ENCOUNTER — Ambulatory Visit (INDEPENDENT_AMBULATORY_CARE_PROVIDER_SITE_OTHER): Payer: BLUE CROSS/BLUE SHIELD | Admitting: Physician Assistant

## 2017-05-08 ENCOUNTER — Other Ambulatory Visit: Payer: Self-pay

## 2017-05-08 DIAGNOSIS — K219 Gastro-esophageal reflux disease without esophagitis: Secondary | ICD-10-CM

## 2017-05-08 DIAGNOSIS — I10 Essential (primary) hypertension: Secondary | ICD-10-CM

## 2017-05-08 MED ORDER — LISINOPRIL 10 MG PO TABS: 10.0000 mg | ORAL_TABLET | Freq: Every day | ORAL | 3 refills | Status: DC

## 2017-05-08 MED ORDER — AMLODIPINE BESYLATE 10 MG PO TABS: 10.0000 mg | ORAL_TABLET | Freq: Every day | ORAL | 3 refills | Status: DC

## 2017-05-08 MED ORDER — OMEPRAZOLE 20 MG PO CPDR: 20.0000 mg | DELAYED_RELEASE_CAPSULE | Freq: Every day | ORAL | 3 refills | Status: DC

## 2017-05-08 NOTE — Progress Notes (Addendum)
MRN: [id number] DOB: 08-Oct-1980  Subjective:   Sheri Robinson is a 36 y.o. female presenting for medication refill.   1) HTN: Currently managed with amlodipine 10mg  and lisinopril 10mg  daily. Patient is checking blood pressure at home, range is 622-633H systolic. Reports no symptoms. Denies lightheadedness, dizziness, chronic headache, double vision, chest pain, shortness of breath, heart racing, palpitations, nausea, vomiting, abdominal pain, hematuria, lower leg swelling. Denies smoking and alcohol use.   2) GERD: Dx x years. Controlled on prilosec 40mg  daily, which was initiated by a GI specialist. She has been out of it for the past 6 days and notes she only started noticing symptoms a few days ago. She notes she tries to avoid foods that worsen GERD but is not aware of all the foods that worsen it. She avoids lying down immediately after eating. She denies abdominal bloating, belching, chest pain, choking on food, difficulty swallowing, dysphagia, early satiety, hematemesis, melena and midespigastric pain. Denies excessive NSAID use. Last Mg was 1.7  in 01/2017. She is unsure if she has had an endoscopy in the past, but thinks she has.   Sheri Robinson has a current medication list which includes the following prescription(s): amlodipine, blood pressure cuff, budesonide-formoterol, chlorpheniramine, cyclobenzaprine, linaclotide, lisinopril, montelukast, naproxen sodium, omeprazole, vitamin d (ergocalciferol), albuterol, benzonatate, cetirizine hcl, first-dukes mouthwash, gabapentin, and promethazine-dextromethorphan. Also is allergic to amoxicillin and penicillins.  Sheri Robinson  has a past medical history of Anemia, Arthritis, Asthma, DVT (deep venous thrombosis) ([place]), GERD (gastroesophageal reflux disease), Heart murmur, Hypertension, Miscarriage, Miscarriage (2013), Peripheral vascular disease ([place]) (2014), Pneumonia, and Pulmonary embolism ([place]). Also  has a past surgical history that includes Myomectomy;  IVC FILTER PLACEMENT ([place] [facility]X) (2014); and LAPAROSCOPIC CHOLECYSTECTOMY WITH INTRAOPERATIVE CHOLANGIOGRAM (N/A, 04/05/2016).   Objective:   Vitals: BP 124/72 (BP Location: Left Arm, Patient Position: Sitting, Cuff Size: Large)   Pulse 89   Temp 98.1 F (36.7 C) (Oral)   Resp 18   Ht 5' 8.35" (1.736 m)   Wt 265 lb 12.8 oz (120.6 kg)   LMP 04/30/2017 (Exact Date)   SpO2 100%   BMI 40.01 kg/m   Physical Exam  Constitutional: She is oriented to person, place, and time. She appears well-developed and well-nourished.  HENT:  Head: Normocephalic and atraumatic.  Mouth/Throat: Uvula is midline, oropharynx is clear and moist and mucous membranes are normal.  Eyes: Conjunctivae are normal.  Neck: Normal range of motion.  Cardiovascular: Normal rate, regular rhythm, normal heart sounds and intact distal pulses.  Pulmonary/Chest: Effort normal and breath sounds normal. She has no wheezes. She has no rhonchi. She has no rales.  Musculoskeletal:       Right lower leg: She exhibits no swelling.       Left lower leg: She exhibits no swelling.  Neurological: She is alert and oriented to person, place, and time.  Skin: Skin is warm and dry.  Psychiatric: She has a normal mood and affect.  Vitals reviewed.   No results found for this or any previous visit (from the past 24 hour(s)).  Assessment and Plan :  1. Essential hypertension Well controlled. Return in 6 months for reevaluation. - lisinopril (PRINIVIL,ZESTRIL) 10 MG tablet; Take 1 tablet (10 mg total) daily by mouth.  Dispense: 90 tablet; Refill: 3 - amLODipine (NORVASC) 10 MG tablet; Take 1 tablet (10 mg total) daily by mouth.  Dispense: 90 tablet; Refill: 3  2. Gastroesophageal reflux disease, esophagitis presence not specified Would like to see how pt does  on 20mg  dose daily as opposed to 40mg  dose daily. Given educational material on GERD and food choices. If she cannot tolerate the 20mg  daily and needs 40mg , consider  reestablishing pt with GI specialist at follow up visit.  - omeprazole (PRILOSEC) 20 MG capsule; Take 1 capsule (20 mg total) daily by mouth.  Dispense: 90 capsule; Refill: [place], PA-C  [facility] at [facility] 05/09/2017 1:02 PM

## 2017-05-08 NOTE — Progress Notes (Deleted)
   Sheri Robinson  MRN: [id number] DOB: 01/26/1981  Subjective:  Sheri Robinson is a 36 y.o. female seen in office today for a chief complaint of ***  Review of Systems  Patient Active Problem List   Diagnosis Date Noted  . Essential hypertension 02/02/2017  . Gastroesophageal reflux disease 02/02/2017  . Community acquired pneumonia of left lung 12/26/2016  . Cough 12/26/2016  . Viral enteritis 12/05/2016  . Gallstones 04/05/2016    Current Outpatient Medications on File Prior to Visit  Medication Sig Dispense Refill  . amLODipine (NORVASC) 10 MG tablet TAKE ONE TABLET BY MOUTH ONCE DAILY 90 tablet 3  . Blood Pressure Monitoring (BLOOD PRESSURE CUFF) MISC 1 Device by Does not apply route daily. 1 each 0  . budesonide-formoterol (SYMBICORT) 160-4.5 MCG/ACT inhaler Inhale 2 puffs into the lungs 2 (two) times daily. 1 Inhaler 6  . chlorpheniramine (CHLOR-TRIMETON) 4 MG tablet Take 1 tablet (4 mg total) by mouth at bedtime. 14 tablet 0  . cyclobenzaprine (FLEXERIL) 10 MG tablet Take 0.5 tablets (5 mg total) by mouth 3 (three) times daily as needed for muscle spasms. 30 tablet 0  . linaclotide (LINZESS) 145 MCG CAPS capsule Take 145 mcg by mouth daily before breakfast.    . lisinopril (PRINIVIL,ZESTRIL) 10 MG tablet Take 1 tablet (10 mg total) by mouth daily. 90 tablet 0  . montelukast (SINGULAIR) 10 MG tablet Take 1 tablet (10 mg total) by mouth at bedtime. 30 tablet 5  . naproxen sodium (ANAPROX) 550 MG tablet Take 550 mg by mouth as needed for mild pain.    Marland Kitchen omeprazole (PRILOSEC) 40 MG capsule Take 1 capsule (40 mg total) by mouth daily. 90 capsule 0  . Vitamin D, Ergocalciferol, (DRISDOL) 50000 units CAPS capsule Take 1 capsule (50,000 Units total) by mouth every 7 (seven) days. 30 capsule 1  . albuterol (PROVENTIL HFA;VENTOLIN HFA) 108 (90 Base) MCG/ACT inhaler Inhale 2 puffs into the lungs every 6 (six) hours as needed for wheezing or shortness of breath. (Patient not taking:  Reported on 05/08/2017) 1 Inhaler 0  . benzonatate (TESSALON) 200 MG capsule Take 1 capsule (200 mg total) by mouth 2 (two) times daily as needed for cough. (Patient not taking: Reported on 05/08/2017) 20 capsule 0  . Cetirizine HCl 10 MG CAPS Take 1 capsule (10 mg total) by mouth daily. (Patient not taking: Reported on 05/08/2017) 30 capsule 12  . Diphenhyd-Hydrocort-Nystatin (FIRST-DUKES MOUTHWASH) SUSP Use as directed 5 mLs in the mouth or throat 4 (four) times daily. Rinse and spit (Patient not taking: Reported on 03/16/2017) 237 mL 1  . gabapentin (NEURONTIN) 100 MG capsule Take 1-3 capsules (100-300 mg total) by mouth 2 (two) times daily. (Patient not taking: Reported on 05/08/2017) 90 capsule 3  . promethazine-dextromethorphan (PROMETHAZINE-DM) 6.25-15 MG/5ML syrup Take 5 mLs by mouth 4 (four) times daily as needed for cough. (Patient not taking: Reported on 03/16/2017) 180 mL 0   No current facility-administered medications on file prior to visit.     Allergies  Allergen Reactions  . Amoxicillin Anaphylaxis  . Penicillins Anaphylaxis     Objective:  There were no vitals taken for this visit.  Physical Exam  Assessment and Plan :  *** There are no diagnoses linked to this encounter.   [name] PA-C  [facility] 05/08/2017 4:52 PM

## 2017-05-08 NOTE — Patient Instructions (Addendum)
Your blood pressure is well controlled today.  Continue checking her blood pressure at home.  Your goal is less than 130/80.  Please return in 6 months for reevaluation.  We will obtain lab work at that visit.  In terms of GERD, information regarding food choices are below.  Hypertension Hypertension is another name for high blood pressure. High blood pressure forces your heart to work harder to pump blood. This can cause problems over time. There are two numbers in a blood pressure reading. There is a top number (systolic) over a bottom number (diastolic). It is best to have a blood pressure below 120/80. Healthy choices can help lower your blood pressure. You may need medicine to help lower your blood pressure if:  Your blood pressure cannot be lowered with healthy choices.  Your blood pressure is higher than 130/80.  Follow these instructions at home: Eating and drinking  If directed, follow the DASH eating plan. This diet includes: ? Filling half of your plate at each meal with fruits and vegetables. ? Filling one quarter of your plate at each meal with whole grains. Whole grains include whole wheat pasta, brown rice, and whole grain bread. ? Eating or drinking low-fat dairy products, such as skim milk or low-fat yogurt. ? Filling one quarter of your plate at each meal with low-fat (lean) proteins. Low-fat proteins include fish, skinless chicken, eggs, beans, and tofu. ? Avoiding fatty meat, cured and processed meat, or chicken with skin. ? Avoiding premade or processed food.  Eat less than 1,500 mg of salt (sodium) a day.  Limit alcohol use to no more than 1 drink a day for nonpregnant women and 2 drinks a day for men. One drink equals 12 oz of beer, 5 oz of wine, or 1 oz of hard liquor. Lifestyle  Work with your doctor to stay at a healthy weight or to lose weight. Ask your doctor what the best weight is for you.  Get at least 30 minutes of exercise that causes your heart to beat  faster (aerobic exercise) most days of the week. This may include walking, swimming, or biking.  Get at least 30 minutes of exercise that strengthens your muscles (resistance exercise) at least 3 days a week. This may include lifting weights or pilates.  Do not use any products that contain nicotine or tobacco. This includes cigarettes and e-cigarettes. If you need help quitting, ask your doctor.  Check your blood pressure at home as told by your doctor.  Keep all follow-up visits as told by your doctor. This is important. Medicines  Take over-the-counter and prescription medicines only as told by your doctor. Follow directions carefully.  Do not skip doses of blood pressure medicine. The medicine does not work as well if you skip doses. Skipping doses also puts you at risk for problems.  Ask your doctor about side effects or reactions to medicines that you should watch for. Contact a doctor if:  You think you are having a reaction to the medicine you are taking.  You have headaches that keep coming back (recurring).  You feel dizzy.  You have swelling in your ankles.  You have trouble with your vision. Get help right away if:  You get a very bad headache.  You start to feel confused.  You feel weak or numb.  You feel faint.  You get very bad pain in your: ? Chest. ? Belly (abdomen).  You throw up (vomit) more than once.  You have trouble  breathing. Summary  Hypertension is another name for high blood pressure.  Making healthy choices can help lower blood pressure. If your blood pressure cannot be controlled with healthy choices, you may need to take medicine. This information is not intended to replace advice given to you by your health care provider. Make sure you discuss any questions you have with your health care provider. Document Released: 11/29/2007 Document Revised: 05/10/2016 Document Reviewed: 05/10/2016 Elsevier Interactive Patient Education  2018  [place] for Gastroesophageal Reflux Disease, Adult When you have gastroesophageal reflux disease (GERD), the foods you eat and your eating habits are very important. Choosing the right foods can help ease your discomfort. What guidelines do I need to follow?  Choose fruits, vegetables, whole grains, and low-fat dairy products.  Choose low-fat meat, fish, and poultry.  Limit fats such as oils, salad dressings, butter, nuts, and avocado.  Keep a food diary. This helps you identify foods that cause symptoms.  Avoid foods that cause symptoms. These may be different for everyone.  Eat small meals often instead of 3 large meals a day.  Eat your meals slowly, in a place where you are relaxed.  Limit fried foods.  Cook foods using methods other than frying.  Avoid drinking alcohol.  Avoid drinking large amounts of liquids with your meals.  Avoid bending over or lying down until 2-3 hours after eating. What foods are not recommended? These are some foods and drinks that may make your symptoms worse: Vegetables Tomatoes. Tomato juice. Tomato and spaghetti sauce. Chili peppers. Onion and garlic. Horseradish. Fruits Oranges, grapefruit, and lemon (fruit and juice). Meats High-fat meats, fish, and poultry. This includes hot dogs, ribs, ham, sausage, salami, and bacon. Dairy Whole milk and chocolate milk. Sour cream. Cream. Butter. Ice cream. Cream cheese. Drinks Coffee and tea. Bubbly (carbonated) drinks or energy drinks. Condiments Hot sauce. Barbecue sauce. Sweets/Desserts Chocolate and cocoa. Donuts. Peppermint and spearmint. Fats and Oils High-fat foods. This includes [place] fries and potato chips. Other Vinegar. Strong spices. This includes black pepper, white pepper, red pepper, cayenne, curry powder, cloves, ginger, and chili powder. The items listed above may not be a complete list of foods and drinks to avoid. Contact your dietitian for more  information. This information is not intended to replace advice given to you by your health care provider. Make sure you discuss any questions you have with your health care provider. Document Released: 12/12/2011 Document Revised: 11/18/2015 Document Reviewed: 04/16/2013 Elsevier Interactive Patient Education  2017 [employer].   IF you received an x-ray today, you will receive an invoice from [facility]. Please contact [facility] at [phone] with questions or concerns regarding your invoice.   IF you received labwork today, you will receive an invoice from [place]. Please contact [facility] at [phone] with questions or concerns regarding your invoice.   Our billing staff will not be able to assist you with questions regarding bills from these companies.  You will be contacted with the lab results as soon as they are available. The fastest way to get your results is to activate your My Chart account. Instructions are located on the last page of this paperwork. If you have not heard from [place] regarding the results in 2 weeks, please contact this office.

## 2017-05-09 ENCOUNTER — Encounter: Payer: Self-pay | Admitting: Physician Assistant

## 2017-05-10 IMAGING — US US EXTREM LOW VENOUS*L*
1 series · 13 of 24 positions shown · non-contrast
Comparison: None.

CLINICAL DATA: Acute onset of left lateral thigh pain. Personal
history of deep venous thrombosis. Initial encounter.



[Series 1: us extrem low venous*left* · 0.08mm/px · 25 acquisitions, 13 frames shown]
[im 1/25]
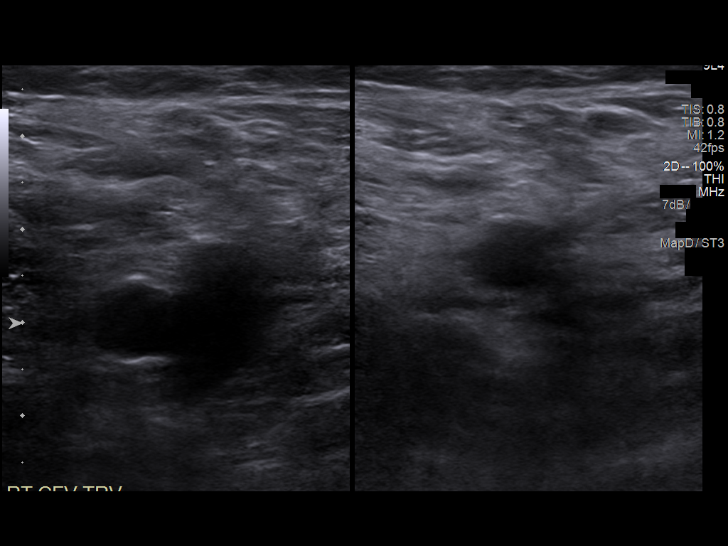
[im 3/25]
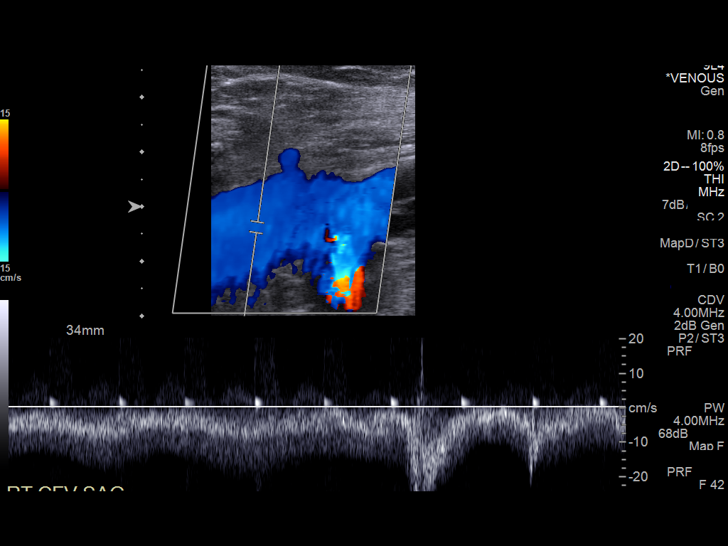
[im 5/25]
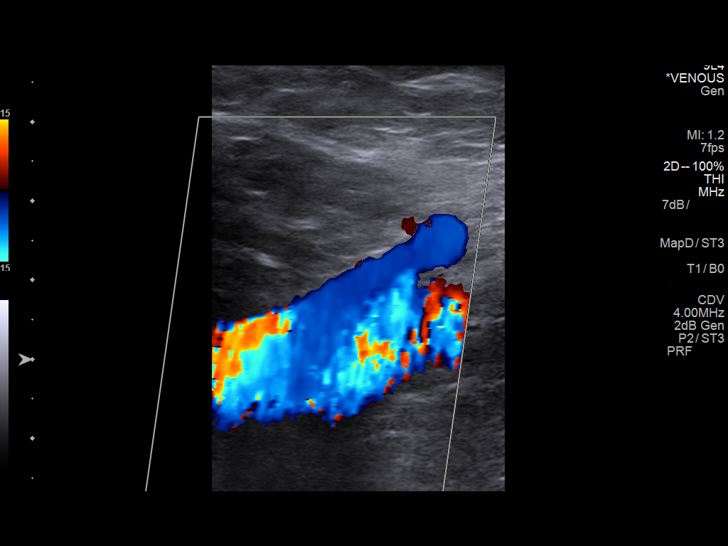
[im 7/25]
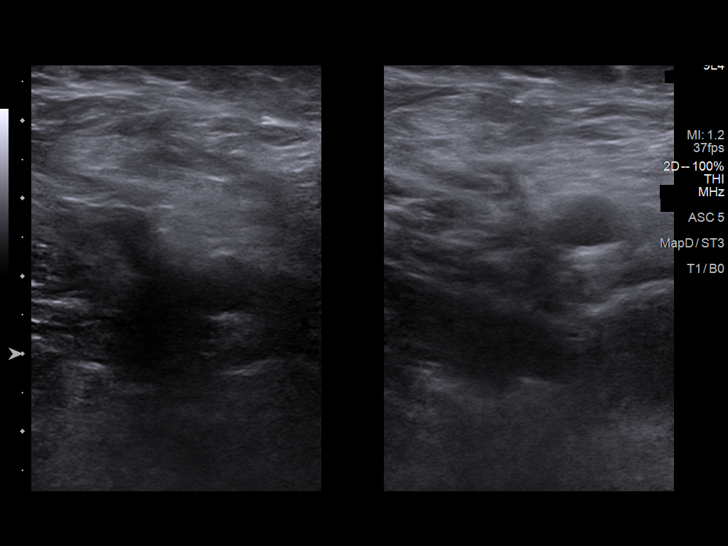
[im 9/25]
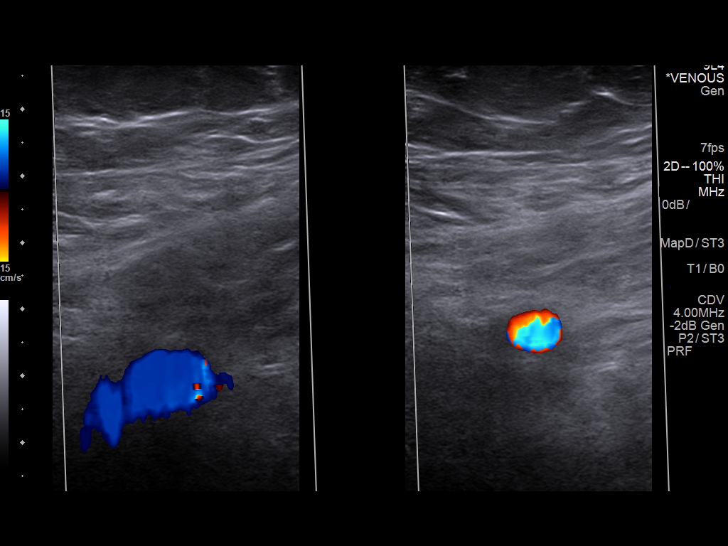
[im 11/25]
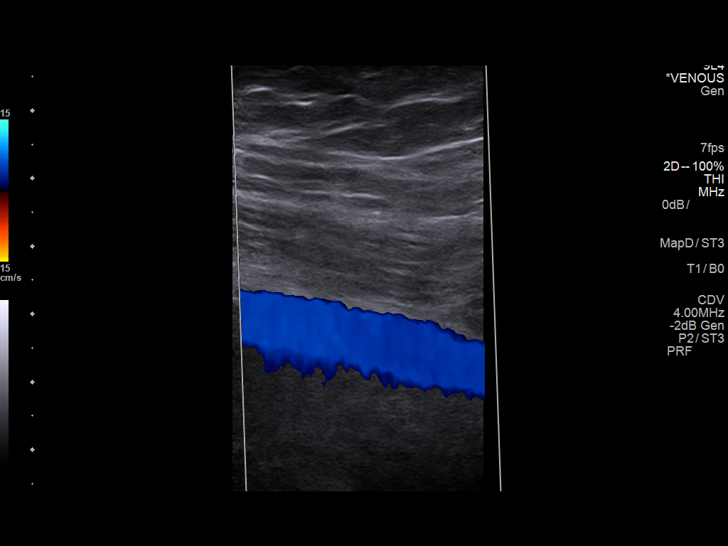
[im 14/25]
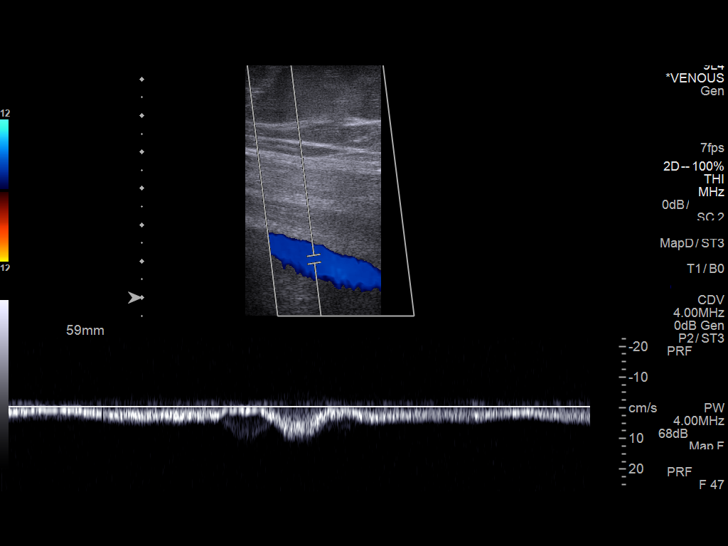
[im 15/25]
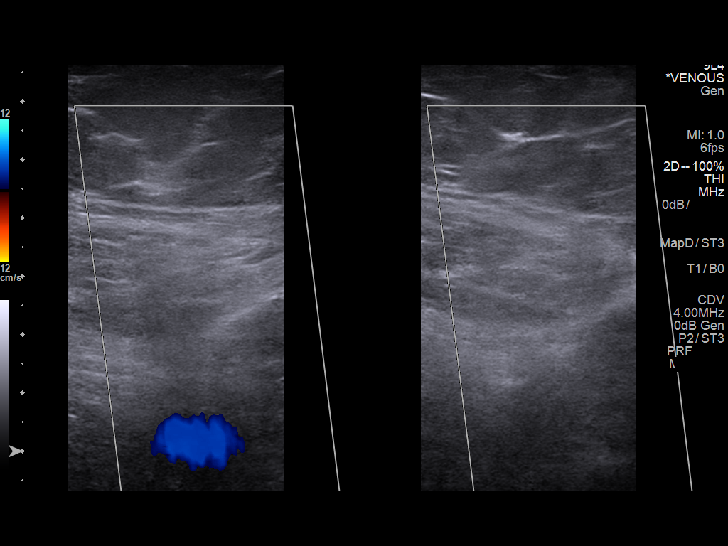
[im 17/25]
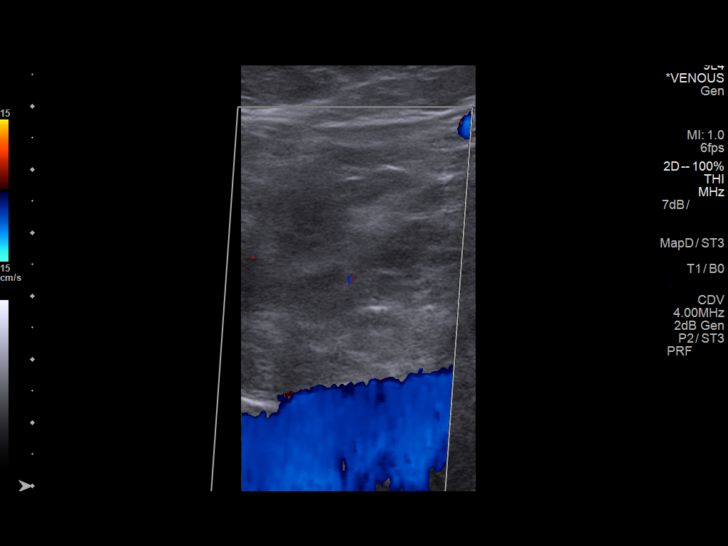
[im 19/25]
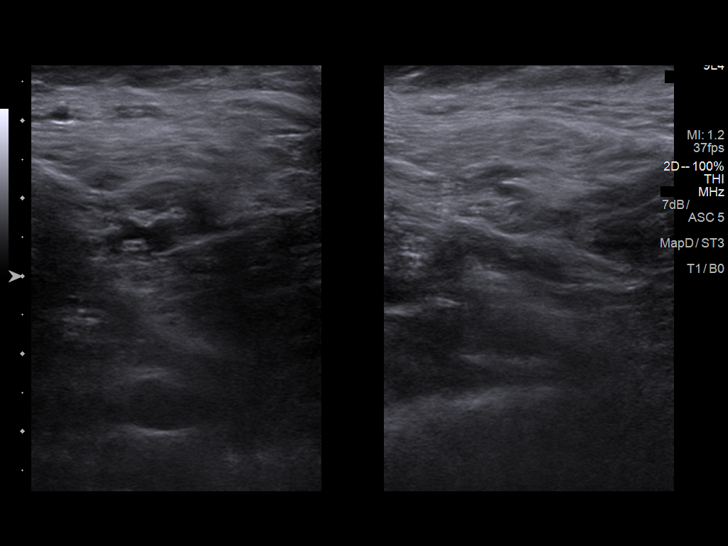
[im 21/25]
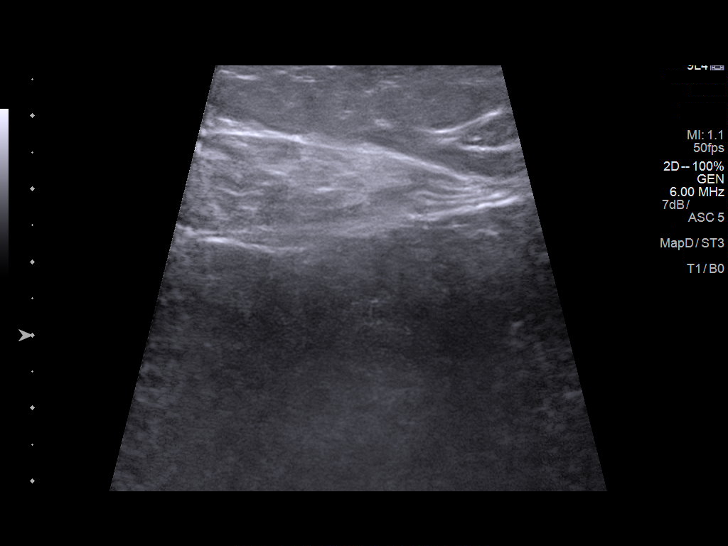
[im 22/25]
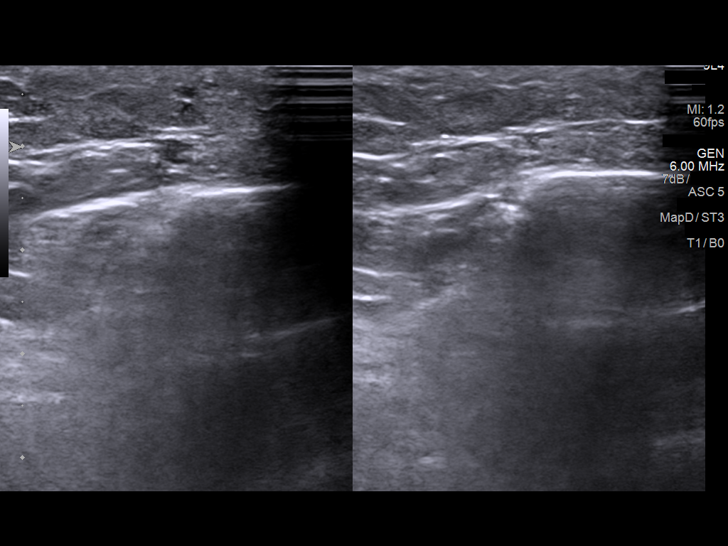
[im 25/25]
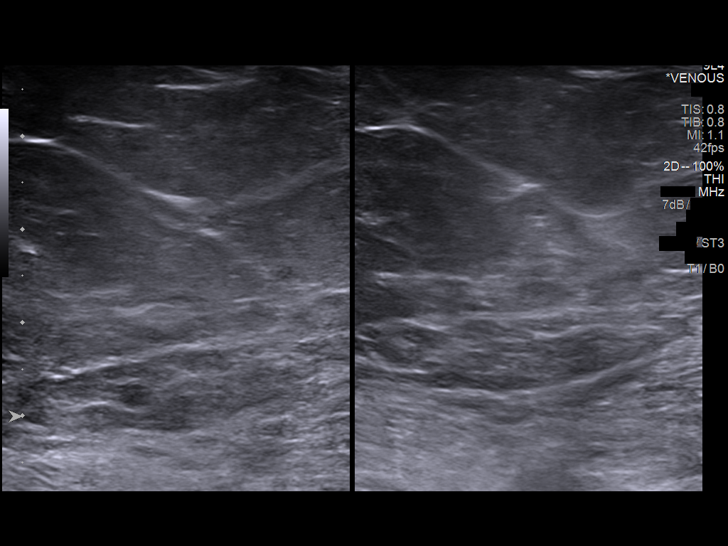

[13 of 24 positions shown; findings below may reference images not displayed]

FINDINGS: Contralateral Common Femoral Vein: Respiratory phasicity is normal
and symmetric with the symptomatic side. No evidence of thrombus.
Normal compressibility.

Common Femoral Vein: No evidence of thrombus. Normal
compressibility, respiratory phasicity and response to augmentation.

Saphenofemoral Junction: No evidence of thrombus. Normal
compressibility and flow on color Doppler imaging.

Profunda Femoral Vein: No evidence of thrombus. Normal
compressibility and flow on color Doppler imaging.

Femoral Vein: No evidence of thrombus. Normal compressibility,
respiratory phasicity and response to augmentation.

Popliteal Vein: No evidence of thrombus. Normal compressibility,
respiratory phasicity and response to augmentation.

Calf Veins: No evidence of thrombus. Normal compressibility and flow
on color Doppler imaging. The peroneal vein is not visualized.

Superficial Great Saphenous Vein: No evidence of thrombus. Normal
compressibility and flow on color Doppler imaging.

Venous Reflux:  None.

Other Findings: No focal abnormality is noted at the left lateral
thigh, at the patient's site of symptoms.
IMPRESSION: No evidence of deep venous thrombosis.

## 2017-05-10 IMAGING — US US ABDOMEN LIMITED
1 series · 14 of 25 positions shown · non-contrast
Comparison: Abdominal ultrasound performed 11/15/2015

CLINICAL DATA: Acute onset of right upper quadrant abdominal pain,
nausea and vomiting. Initial encounter.

EXAM:
US ABDOMEN LIMITED - RIGHT UPPER QUADRANT

[Series 1: us abdomen limited · 0.18mm/px · 14 of 45 slices shown]
[im 1/45]
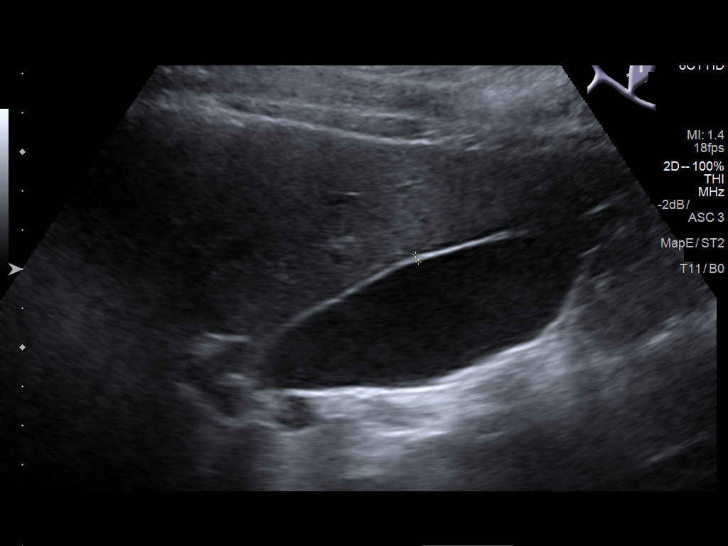
[im 4/45]
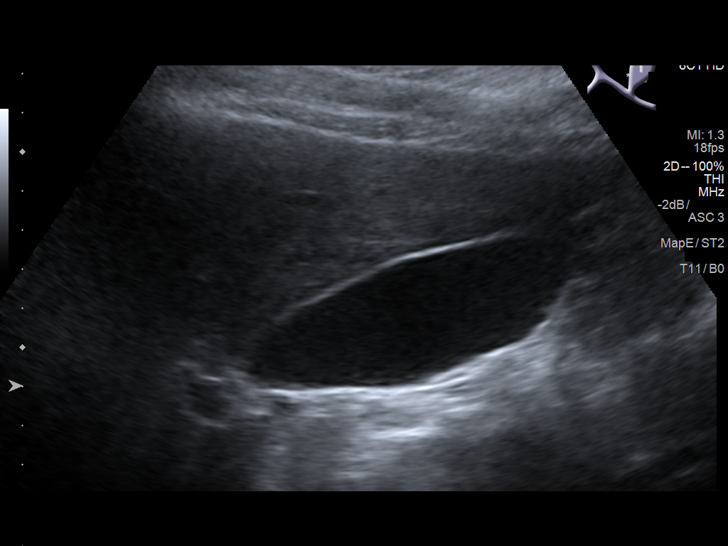
[im 8/45]
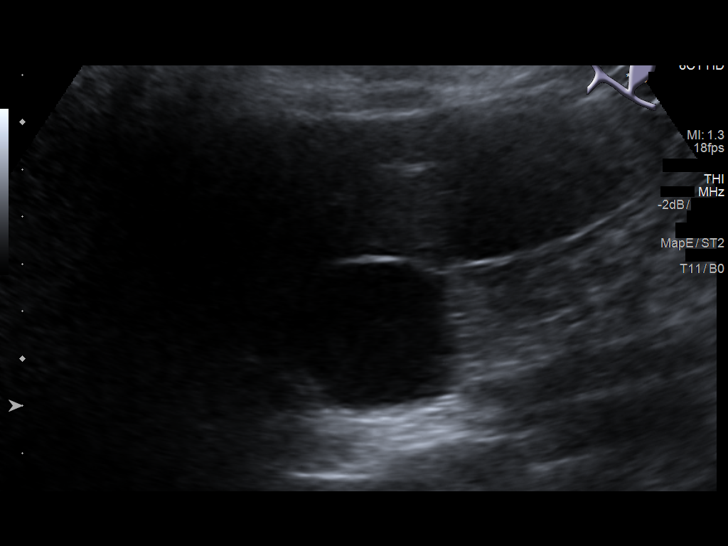
[im 12/45]
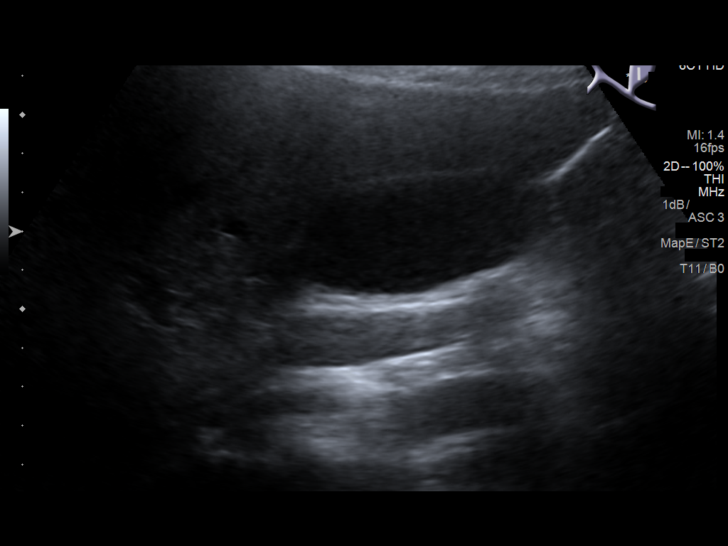
[im 15/45]
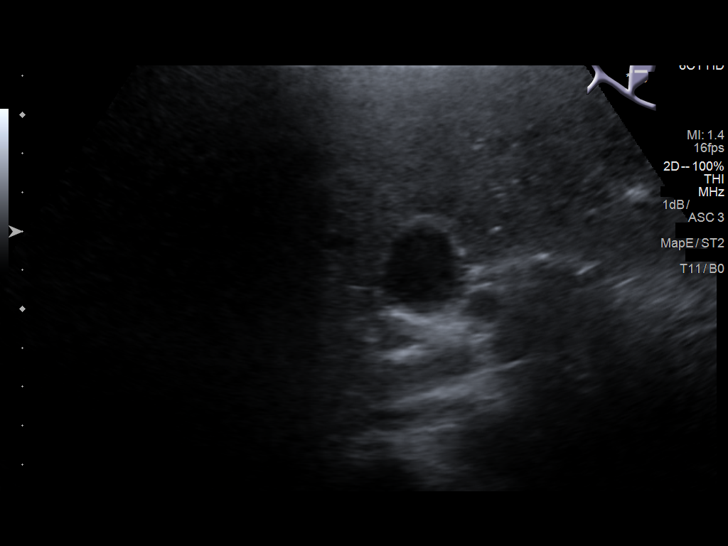
[im 17/45]
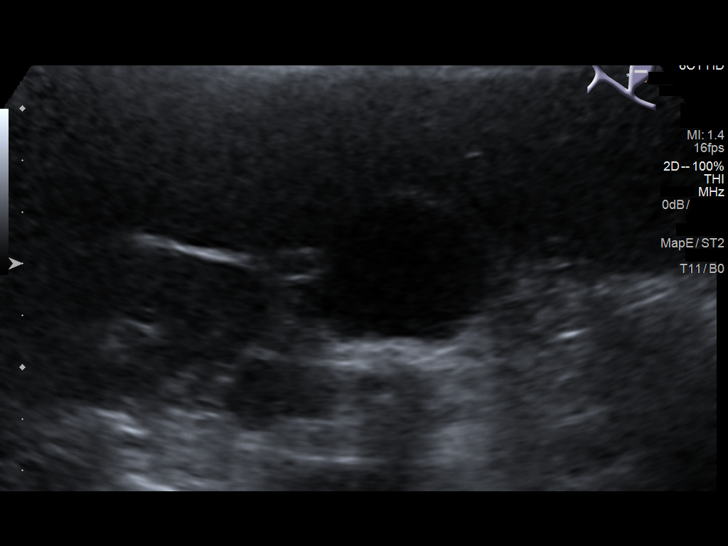
[im 21/45]
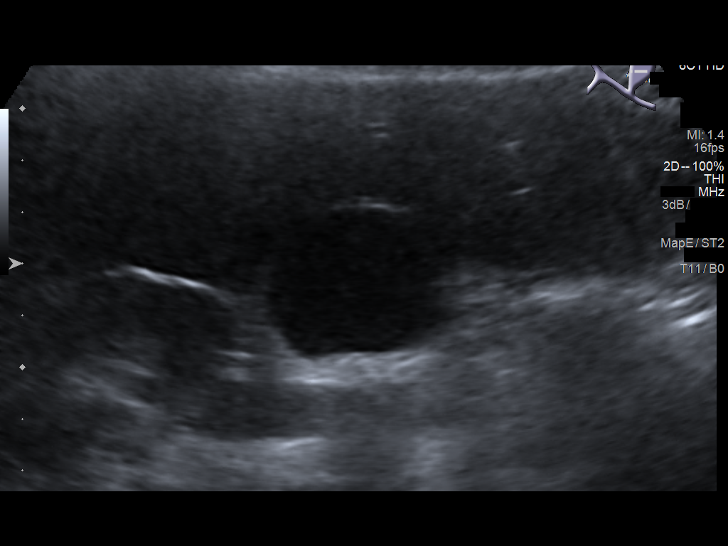
[im 24/45]
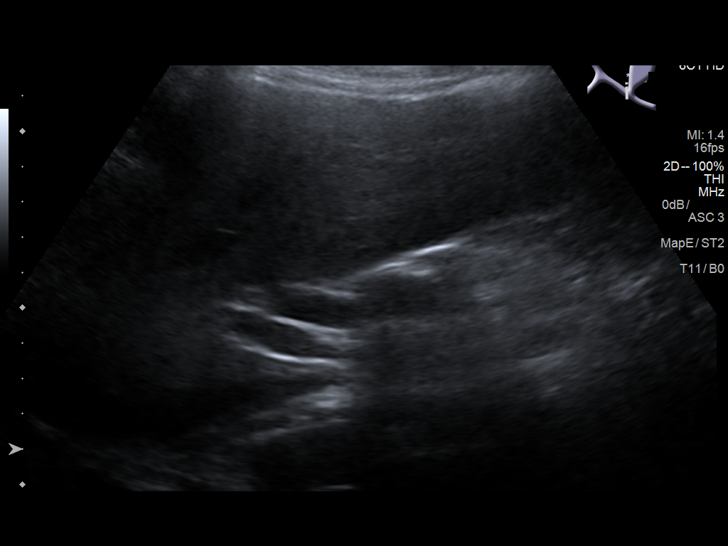
[im 28/45]
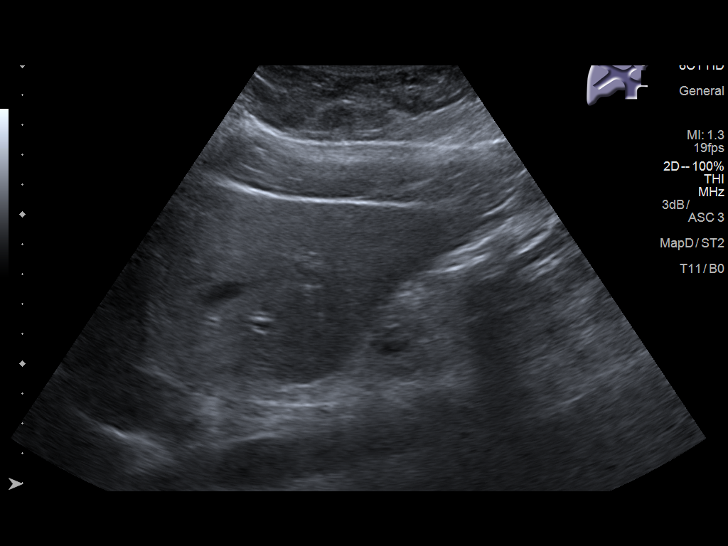
[im 30/45]
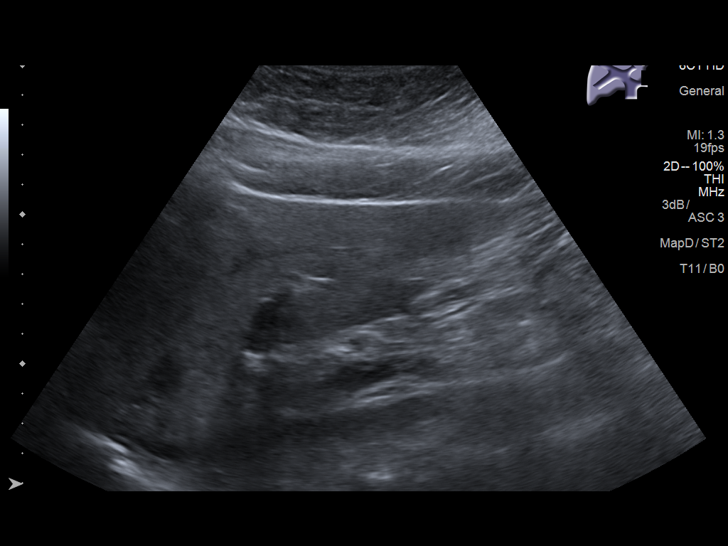
[im 34/45]
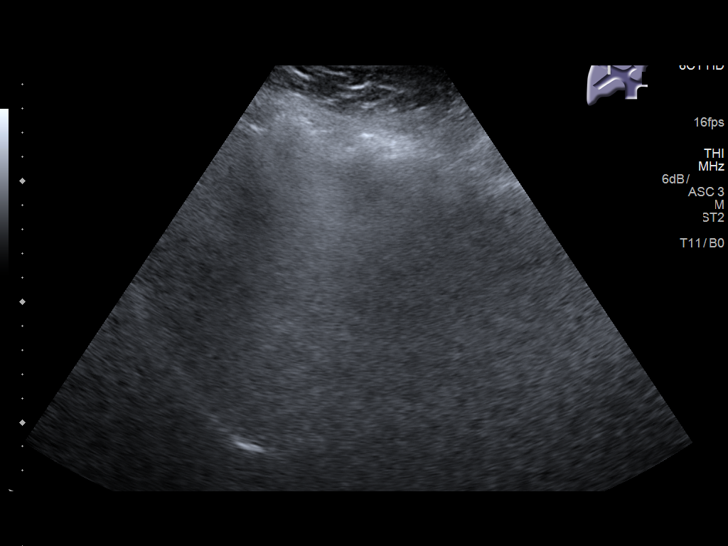
[im 37/45]
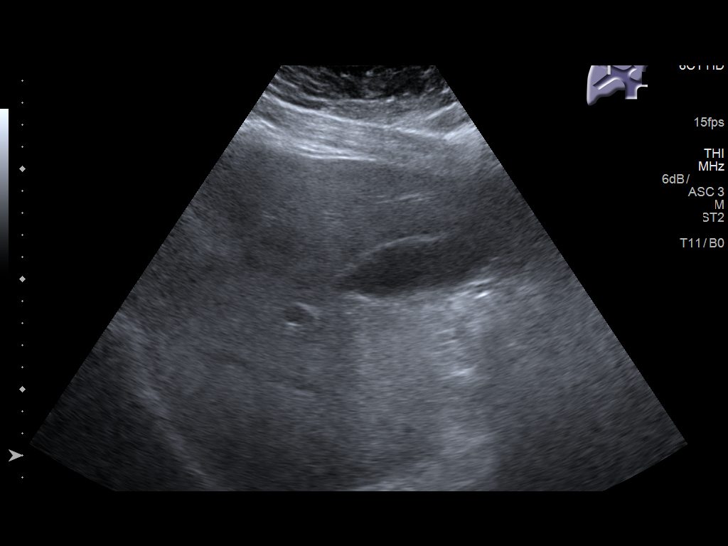
[im 41/45]
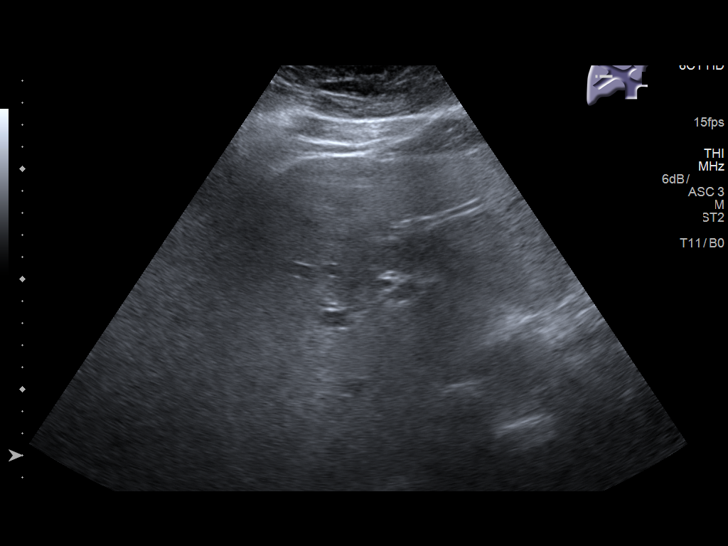
[im 45/45]
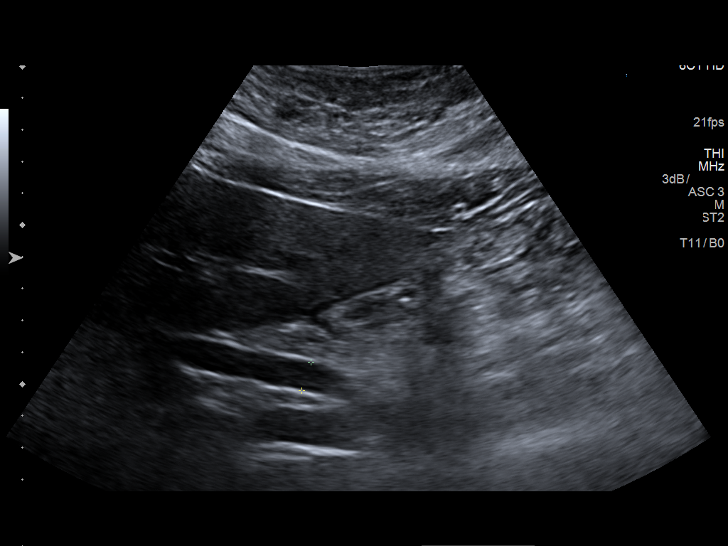

[14 of 25 positions shown; findings below may reference images not displayed]

FINDINGS: Gallbladder:

There is question of tiny layering stones within the gallbladder. No
gallbladder wall thickening or pericholecystic fluid is seen. No
ultrasonographic Murphy's sign is elicited.

Common bile duct:

Diameter: 0.9 cm, raising concern for distal obstruction.

Liver:

No focal lesion identified. Mildly increased parenchymal
echogenicity likely reflects fatty infiltration.
IMPRESSION: 1. Dilatation of the common bile duct to 0.9 cm, raising concern for
distal obstruction. Would correlate with LFTs, and consider MRCP or
ERCP for further evaluation, as deemed clinically appropriate.
2. Question of tiny layering stones within the gallbladder. No
evidence of cholecystitis.
3. Mild fatty infiltration within the liver.

## 2017-05-22 ENCOUNTER — Ambulatory Visit (INDEPENDENT_AMBULATORY_CARE_PROVIDER_SITE_OTHER): Payer: BLUE CROSS/BLUE SHIELD | Admitting: Pulmonary Disease

## 2017-05-22 ENCOUNTER — Encounter: Payer: Self-pay | Admitting: Pulmonary Disease

## 2017-05-22 VITALS — BP 118/80 | HR 87 | Ht 67.0 in | Wt 267.0 lb

## 2017-05-22 DIAGNOSIS — R058 Other specified cough: Secondary | ICD-10-CM

## 2017-05-22 DIAGNOSIS — R05 Cough: Secondary | ICD-10-CM | POA: Diagnosis not present

## 2017-05-22 DIAGNOSIS — R0602 Shortness of breath: Secondary | ICD-10-CM | POA: Diagnosis not present

## 2017-05-22 DIAGNOSIS — J4540 Moderate persistent asthma, uncomplicated: Secondary | ICD-10-CM | POA: Diagnosis not present

## 2017-05-22 LAB — PULMONARY FUNCTION TEST
DL/VA % PRED: 102 %
DL/VA: 5.27 ml/min/mmHg/L
DLCO COR: 20.75 ml/min/mmHg
DLCO UNC % PRED: 71 %
DLCO cor % pred: 73 %
DLCO unc: 20.29 ml/min/mmHg
FEF 25-75 PRE: 3.67 L/s
FEF 25-75 Post: 2.4 L/sec
FEF2575-%CHANGE-POST: -34 %
FEF2575-%PRED-POST: 71 %
FEF2575-%PRED-PRE: 109 %
FEV1-%CHANGE-POST: -8 %
FEV1-%Pred-Post: 70 %
FEV1-%Pred-Pre: 76 %
FEV1-Post: 2.34 L
FEV1-Pre: 2.56 L
FEV1FVC-%CHANGE-POST: -2 %
FEV1FVC-%PRED-PRE: 107 %
FEV6-%Change-Post: -6 %
FEV6-%PRED-POST: 66 %
FEV6-%Pred-Pre: 71 %
FEV6-Post: 2.67 L
FEV6-Pre: 2.86 L
FEV6FVC-%Pred-Post: 102 %
FEV6FVC-%Pred-Pre: 102 %
FVC-%Change-Post: -6 %
FVC-%Pred-Post: 65 %
FVC-%Pred-Pre: 69 %
FVC-Post: 2.67 L
FVC-Pre: 2.86 L
POST FEV1/FVC RATIO: 88 %
PRE FEV6/FVC RATIO: 100 %
Post FEV6/FVC ratio: 100 %
Pre FEV1/FVC ratio: 90 %
RV % PRED: 90 %
RV: 1.55 L
TLC % pred: 79 %
TLC: 4.36 L

## 2017-05-22 MED ORDER — LEVALBUTEROL TARTRATE 45 MCG/ACT IN AERO: 2.0000 | INHALATION_SPRAY | Freq: Four times a day (QID) | RESPIRATORY_TRACT | 12 refills | Status: DC | PRN

## 2017-05-22 NOTE — Patient Instructions (Signed)
Try using salt water spray for your sinuses at least daily Xopenex two puffs every 6 hours as needed for cough, wheezing, chest congestion, or shortness of breath  Follow up in 6 months

## 2017-05-22 NOTE — Progress Notes (Signed)
Current Outpatient Medications on File Prior to Visit  Medication Sig  . amLODipine (NORVASC) 10 MG tablet Take 1 tablet (10 mg total) daily by mouth.  . baclofen (LIORESAL) 10 MG tablet   . Blood Pressure Monitoring (BLOOD PRESSURE CUFF) MISC 1 Device by Does not apply route daily.  . budesonide-formoterol (SYMBICORT) 160-4.5 MCG/ACT inhaler Inhale 2 puffs into the lungs 2 (two) times daily.  . chlorpheniramine (CHLOR-TRIMETON) 4 MG tablet Take 1 tablet (4 mg total) by mouth at bedtime.  . cyclobenzaprine (FLEXERIL) 10 MG tablet Take 0.5 tablets (5 mg total) by mouth 3 (three) times daily as needed for muscle spasms.  Marland Kitchen linaclotide (LINZESS) 145 MCG CAPS capsule Take 145 mcg by mouth daily before breakfast.  . lisinopril (PRINIVIL,ZESTRIL) 10 MG tablet Take 1 tablet (10 mg total) daily by mouth.  . montelukast (SINGULAIR) 10 MG tablet Take 1 tablet (10 mg total) by mouth at bedtime.  . naproxen sodium (ANAPROX) 550 MG tablet Take 550 mg by mouth as needed for mild pain.  Marland Kitchen omeprazole (PRILOSEC) 20 MG capsule Take 1 capsule (20 mg total) daily by mouth.  . valACYclovir (VALTREX) 500 MG tablet   . Vitamin D, Ergocalciferol, (DRISDOL) 50000 units CAPS capsule Take 1 capsule (50,000 Units total) by mouth every 7 (seven) days.  Marland Kitchen zonisamide (ZONEGRAN) 25 MG capsule    No current facility-administered medications on file prior to visit.      Chief Complaint  Patient presents with  . Follow-up    pt has dry cough, allergies are bothersome this week, and sore throat with horseness on and off. Pt had PFT prior to appt this morning.    Pulmonary tests CT angio chest 12/21/16 >> lingular consolidation CT chest 01/25/17 >> decreased infiltrate Lingular lobe PFT 05/22/17 >> FEV1 2.56 (76%), FEV1% 90, TLC 4.36 (79%), DLCO 71%, no BD  Past medical history Anemia, GERD, HTN, Recurrent PNA  Past surgical history, Family history, Social history, Allergies all reviewed.  Vital Signs BP 118/80 (BP  Location: Left Arm, Cuff Size: Normal)   Pulse 87   Ht 5\' 7"  (1.702 m)   Wt 267 lb (121.1 kg)   LMP 04/30/2017 (Exact Date)   SpO2 100%   BMI 41.82 kg/m   History of Present Illness Sheri Robinson is a 36 y.o. female with cough from asthma and upper airway cough syndrome.  Since her last visit with me she was seen by Noe Gens.  She had chest xray from 02/07/17 which showed resolution of lingular infiltrate.    She still gets intermittent episodes of sinus congestion with post nasal drip, hoarse throat, chest congestion, coughing, and wheeze.  She will get cough and wheeze when she exerts too much.  She has tried using albuterol, but this doesn't seem to work and causes more throat irritation.  She will use extra dose of symbicort, and this seems to improve her symptoms.  She has tried nasal steroids before, but these caused nasal dryness.  She denies recent fever, skin rash, reflux, leg swelling, or gland swelling.  PFT today showed mild restriction and mild diffusion defect that corrected for lung volumes.  She had frequent coughing spells during test maneuvers.     Physical Exam  General - pleasant Eyes - pupils reactive, wears glasses ENT - no sinus tenderness, no oral exudate, no LAN, raspy voice, MP 3, elongated uvula, 2+ tonsils Cardiac - regular, no murmur Chest - no wheeze, rales Abd - soft, non tender Ext -  no edema Skin - no rashes Neuro - normal strength Psych - normal mood   Assessment/Plan  Persistent asthma. - continue symbicort and singulair - she is intolerant of albuterol inhalers >> not effective and causes throat irritation - will try changing rescue inhaler to xopenex  Upper airway cough syndrome with allergic rhinitis and post nasal drip. - will have her try nasal irrigation - she had trouble with nasal dryness from previous use of nasal steroids - continue singulair and chlorpheniramine  Hypertension. - she is on lisinopril - her cough  predates the start of lisinopril, and she doesn't feel that her symptoms have gotten worse since being on lisinopril - okay to continue lisinopril    Patient Instructions  Try using salt water spray for your sinuses at least daily Xopenex two puffs every 6 hours as needed for cough, wheezing, chest congestion, or shortness of breath  Follow up in 6 months    Chesley Mires, MD [facility] Pager:  [phone] 05/22/2017, 11:32 AM

## 2017-05-22 NOTE — Progress Notes (Signed)
PFT done today. 

## 2017-06-01 ENCOUNTER — Encounter: Payer: Self-pay | Admitting: Physician Assistant

## 2017-06-06 ENCOUNTER — Telehealth: Payer: Self-pay | Admitting: *Deleted

## 2017-06-06 NOTE — Telephone Encounter (Signed)
Initiated PA for Xopenex HFA thru [place].   [facility] # [id number]  [place]: [id number]

## 2017-06-07 NOTE — Telephone Encounter (Signed)
According to [facility], review is still pending.

## 2017-06-08 NOTE — Telephone Encounter (Signed)
VS what would be the next option? Please advise.

## 2017-06-08 NOTE — Telephone Encounter (Signed)
Please let her know her insurance has denied coverage for xopenex.  Can send order for ventolin HFA two puffs q6h prn cough, dyspnea, or wheezing.

## 2017-06-08 NOTE — Telephone Encounter (Signed)
Levalbuterol 42mcg/act denied. No alternatives given.

## 2017-06-08 NOTE — Telephone Encounter (Signed)
Sheri Robinson with [employer] calling to let us know that Xopenex has been denied.  Notification has been faxed today.  CB is [phone] opt [phone] if need to call her back.

## 2017-06-22 ENCOUNTER — Ambulatory Visit: Payer: BLUE CROSS/BLUE SHIELD | Admitting: Physician Assistant

## 2017-07-03 ENCOUNTER — Ambulatory Visit: Payer: BLUE CROSS/BLUE SHIELD | Admitting: Oncology

## 2017-07-03 ENCOUNTER — Other Ambulatory Visit: Payer: BLUE CROSS/BLUE SHIELD

## 2017-08-07 ENCOUNTER — Other Ambulatory Visit: Payer: Self-pay

## 2017-08-07 ENCOUNTER — Ambulatory Visit: Payer: Self-pay | Admitting: Physician Assistant

## 2017-08-07 ENCOUNTER — Encounter: Payer: Self-pay | Admitting: Physician Assistant

## 2017-08-07 ENCOUNTER — Ambulatory Visit: Payer: Self-pay | Admitting: *Deleted

## 2017-08-07 VITALS — BP 126/78 | HR 107 | Temp 98.5°F | Resp 16 | Ht 68.11 in | Wt 263.0 lb

## 2017-08-07 DIAGNOSIS — B9789 Other viral agents as the cause of diseases classified elsewhere: Secondary | ICD-10-CM

## 2017-08-07 DIAGNOSIS — J069 Acute upper respiratory infection, unspecified: Secondary | ICD-10-CM

## 2017-08-07 MED ORDER — GUAIFENESIN ER 1200 MG PO TB12: 1.0000 | ORAL_TABLET | Freq: Two times a day (BID) | ORAL | 1 refills | Status: DC | PRN

## 2017-08-07 MED ORDER — BENZONATATE 100 MG PO CAPS: 100.0000 mg | ORAL_CAPSULE | Freq: Three times a day (TID) | ORAL | 0 refills | Status: DC | PRN

## 2017-08-07 MED ORDER — AZELASTINE HCL 0.1 % NA SOLN: 2.0000 | Freq: Two times a day (BID) | NASAL | 0 refills | Status: DC

## 2017-08-07 NOTE — Telephone Encounter (Signed)
  Patient is calling with sinus congestion- she has sinus pain and congestion in her chest that is causing trouble breathing at night. Appointment this morning. Reason for Disposition . [1] SEVERE pain AND [2] not improved 2 hours after pain medicine  Answer Assessment - Initial Assessment Questions 1. LOCATION: "Where does it hurt?"      Throat pain and facial pain 2. ONSET: "When did the sinus pain start?"  (e.g., hours, days)      2 days ago 3. SEVERITY: "How bad is the pain?"   (Scale 1-10; mild, moderate or severe)   - MILD (1-3): doesn't interfere with normal activities    - MODERATE (4-7): interferes with normal activities (e.g., work or school) or awakens from sleep   - SEVERE (8-10): excruciating pain and patient unable to do any normal activities        7 4. RECURRENT SYMPTOM: "Have you ever had sinus problems before?" If so, ask: "When was the last time?" and "What happened that time?"      Allergy sufferer  5. NASAL CONGESTION: "Is the nose blocked?" If so, ask, "Can you open it or must you breathe through the mouth?"     Nasal congestion on right  6. NASAL DISCHARGE: "Do you have discharge from your nose?" If so ask, "What color?"    Yes- clear 7. FEVER: "Do you have a fever?" If so, ask: "What is it, how was it measured, and when did it start?"      Sunday and Monday- not today 101- oral thermometer 8. OTHER SYMPTOMS: "Do you have any other symptoms?" (e.g., sore throat, cough, earache, difficulty breathing)     Sore throat, cough, difficulty breathing 9. PREGNANCY: "Is there any chance you are pregnant?" "When was your last menstrual period?"     No- LMP- 07/22/17  Protocols used: SINUS PAIN OR CONGESTION-A-AH

## 2017-08-07 NOTE — Patient Instructions (Addendum)
Continue your regular medications. Add an oral antihistamine (Claritin, Allegra or Zyrtec) to help with the runny nose. Add the new medications while you are having symptoms.    IF you received an x-ray today, you will receive an invoice from [facility]. Please contact [facility] at [phone] with questions or concerns regarding your invoice.   IF you received labwork today, you will receive an invoice from [place]. Please contact [facility] at [phone] with questions or concerns regarding your invoice.   Our billing staff will not be able to assist you with questions regarding bills from these companies.  You will be contacted with the lab results as soon as they are available. The fastest way to get your results is to activate your My Chart account. Instructions are located on the last page of this paperwork. If you have not heard from [place] regarding the results in 2 weeks, please contact this office.     Upper Respiratory Infection, Adult Most upper respiratory infections (URIs) are caused by a virus. A URI affects the nose, throat, and upper air passages. The most common type of URI is often called "the common cold." Follow these instructions at home:  Take medicines only as told by your doctor.  Gargle warm saltwater or take cough drops to comfort your throat as told by your doctor.  Use a warm mist humidifier or inhale steam from a shower to increase air moisture. This may make it easier to breathe.  Drink enough fluid to keep your pee (urine) clear or pale yellow.  Eat soups and other clear broths.  Have a healthy diet.  Rest as needed.  Go back to work when your fever is gone or your doctor says it is okay. ? You may need to stay home longer to avoid giving your URI to others. ? You can also wear a face mask and wash your hands often to prevent spread of the virus.  Use your inhaler more if you have asthma.  Do not use any tobacco products,  including cigarettes, chewing tobacco, or electronic cigarettes. If you need help quitting, ask your doctor. Contact a doctor if:  You are getting worse, not better.  Your symptoms are not helped by medicine.  You have chills.  You are getting more short of breath.  You have brown or red mucus.  You have yellow or brown discharge from your nose.  You have pain in your face, especially when you bend forward.  You have a fever.  You have puffy (swollen) neck glands.  You have pain while swallowing.  You have white areas in the back of your throat. Get help right away if:  You have very bad or constant: ? Headache. ? Ear pain. ? Pain in your forehead, behind your eyes, and over your cheekbones (sinus pain). ? Chest pain.  You have long-lasting (chronic) lung disease and any of the following: ? Wheezing. ? Long-lasting cough. ? Coughing up blood. ? A change in your usual mucus.  You have a stiff neck.  You have changes in your: ? Vision. ? Hearing. ? Thinking. ? Mood. This information is not intended to replace advice given to you by your health care provider. Make sure you discuss any questions you have with your health care provider. Document Released: 11/29/2007 Document Revised: 02/13/2016 Document Reviewed: 09/17/2013 Elsevier Interactive Patient Education  2018 [employer].

## 2017-08-07 NOTE — Progress Notes (Signed)
Patient ID: Sheri Robinson, female    DOB: [birth year], 37 y.o.   MRN: [id number]  PCP: Leonie Douglas, PA-C  Chief Complaint  Patient presents with  . Sinus Problem    head pressure with nasal congestion and chest congestion x 3 days   . Cough    pt states cough has been Chronic with some sore throat at this point and lost of voice     Subjective:   Presents for evaluation of sinus pressure, nasal congestion, chest congestion and cough times 3 days.  Cough is non-productive. Clear nasal drainage. No body aches. Fever x 2 days, Tmax 101 Nausea, no vomiting or diarrhea. Sleep disrupted by cough, nasal congestion/running. Lots of mouth breathing.  Has lost his voice due to near constant coughing.    Review of Systems As above    Patient Active Problem List   Diagnosis Date Noted  . Essential hypertension 02/02/2017  . Gastroesophageal reflux disease 02/02/2017  . Community acquired pneumonia of left lung 12/26/2016  . Cough 12/26/2016  . Viral enteritis 12/05/2016  . Gallstones 04/05/2016     Prior to Admission medications   Medication Sig Start Date End Date Taking? Authorizing Provider  amLODipine (NORVASC) 10 MG tablet Take 1 tablet (10 mg total) daily by mouth. 05/08/17  Yes Timmothy Euler, [place] D, PA-C  baclofen (LIORESAL) 10 MG tablet  03/22/17  Yes [provider]  Blood Pressure Monitoring (BLOOD PRESSURE CUFF) MISC 1 Device by Does not apply route daily. 12/30/15  Yes Timmothy Euler, [place] D, PA-C  budesonide-formoterol (SYMBICORT) 160-4.5 MCG/ACT inhaler Inhale 2 puffs into the lungs 2 (two) times daily. 01/24/17  Yes Chesley Mires, MD  cyclobenzaprine (FLEXERIL) 10 MG tablet Take 0.5 tablets (5 mg total) by mouth 3 (three) times daily as needed for muscle spasms. 03/08/17  Yes [name] D, PA-C  levalbuterol [facility] HFA) 45 MCG/ACT inhaler Inhale 2 puffs into the lungs every 6 (six) hours as needed for wheezing or shortness of breath. 05/22/17   Yes Chesley Mires, MD  lisinopril (PRINIVIL,ZESTRIL) 10 MG tablet Take 1 tablet (10 mg total) daily by mouth. 05/08/17  Yes Timmothy Euler, [place] D, PA-C  montelukast (SINGULAIR) 10 MG tablet Take 1 tablet (10 mg total) by mouth at bedtime. 01/24/17  Yes Chesley Mires, MD  naproxen sodium (ANAPROX) 550 MG tablet Take 550 mg by mouth as needed for mild pain.   Yes [provider]  omeprazole (PRILOSEC) 20 MG capsule Take 1 capsule (20 mg total) daily by mouth. 05/08/17  Yes Timmothy Euler, [place] D, PA-C  valACYclovir (VALTREX) 500 MG tablet  04/09/17  Yes [provider]  Vitamin D, Ergocalciferol, (DRISDOL) 50000 units CAPS capsule Take 1 capsule (50,000 Units total) by mouth every 7 (seven) days. 08/30/16  Yes Scot Jun, FNP  zonisamide (ZONEGRAN) 25 MG capsule  04/19/17  Yes [provider]     Allergies  Allergen Reactions  . Amoxicillin Anaphylaxis  . Penicillins Anaphylaxis       Objective:  Physical Exam  Constitutional: She is oriented to person, place, and time. She appears well-developed and well-nourished. No distress.  BP 126/78   Pulse (!) 107   Temp 98.5 F (36.9 C) (Oral)   Resp 16   Ht 5' 8.11" (1.73 m)   Wt 263 lb (119.3 kg)   LMP 07/22/2017 (Approximate)   SpO2 95%   BMI 39.86 kg/m    HENT:  Head: Normocephalic and atraumatic.  Right Ear: Hearing, tympanic  membrane, external ear and ear canal normal.  Left Ear: Hearing, tympanic membrane, external ear and ear canal normal.  Nose: Mucosal edema and rhinorrhea present.  No foreign bodies. Right sinus exhibits no maxillary sinus tenderness and no frontal sinus tenderness. Left sinus exhibits no maxillary sinus tenderness and no frontal sinus tenderness.  Mouth/Throat: Uvula is midline, oropharynx is clear and moist and mucous membranes are normal. No uvula swelling. No oropharyngeal exudate.  Eyes: Conjunctivae and EOM are normal. Pupils are equal, round, and reactive to light. Right eye  exhibits no discharge. Left eye exhibits no discharge. No scleral icterus.  Neck: Trachea normal, normal range of motion and full passive range of motion without pain. Neck supple. No thyroid mass and no thyromegaly present.  Cardiovascular: Normal rate, regular rhythm and normal heart sounds.  Pulmonary/Chest: Effort normal and breath sounds normal.  Lymphadenopathy:       Head (right side): No submandibular, no tonsillar, no preauricular, no posterior auricular and no occipital adenopathy present.       Head (left side): No submandibular, no tonsillar, no preauricular and no occipital adenopathy present.    She has no cervical adenopathy.       Right: No supraclavicular adenopathy present.       Left: No supraclavicular adenopathy present.  Neurological: She is alert and oriented to person, place, and time. She has normal strength. No cranial nerve deficit or sensory deficit.  Skin: Skin is warm, dry and intact. No rash noted.  Psychiatric: She has a normal mood and affect. Her speech is normal and behavior is normal.       Assessment & Plan:   1. Viral URI with cough Anticipatory guidance provided.  Supportive care. - azelastine (ASTELIN) 0.1 % nasal spray; Place 2 sprays into both nostrils 2 (two) times daily. Use in each nostril as directed  Dispense: 30 mL; Refill: 0 - benzonatate (TESSALON) 100 MG capsule; Take 1-2 capsules (100-200 mg total) by mouth 3 (three) times daily as needed for cough.  Dispense: 40 capsule; Refill: 0 - Guaifenesin (MUCINEX MAXIMUM STRENGTH) 1200 MG TB12; Take 1 tablet (1,200 mg total) by mouth every 12 (twelve) hours as needed.  Dispense: 14 tablet; Refill: 1    Return if symptoms worsen or fail to improve.   Fara Chute, PA-C [facility] [place]

## 2017-09-19 ENCOUNTER — Other Ambulatory Visit: Payer: Self-pay | Admitting: Family Medicine

## 2017-09-26 ENCOUNTER — Encounter: Payer: Self-pay | Admitting: Physician Assistant

## 2017-09-26 ENCOUNTER — Other Ambulatory Visit: Payer: Self-pay | Admitting: Family Medicine

## 2017-11-07 ENCOUNTER — Ambulatory Visit: Payer: BLUE CROSS/BLUE SHIELD | Admitting: Physician Assistant

## 2017-12-29 ENCOUNTER — Ambulatory Visit (INDEPENDENT_AMBULATORY_CARE_PROVIDER_SITE_OTHER): Payer: BLUE CROSS/BLUE SHIELD | Admitting: Physician Assistant

## 2017-12-29 ENCOUNTER — Encounter: Payer: Self-pay | Admitting: Physician Assistant

## 2017-12-29 ENCOUNTER — Other Ambulatory Visit: Payer: Self-pay

## 2017-12-29 VITALS — BP 114/82 | HR 78 | Temp 98.6°F | Resp 16 | Ht 67.0 in | Wt 266.2 lb

## 2017-12-29 DIAGNOSIS — Z8639 Personal history of other endocrine, nutritional and metabolic disease: Secondary | ICD-10-CM | POA: Diagnosis not present

## 2017-12-29 DIAGNOSIS — Z862 Personal history of diseases of the blood and blood-forming organs and certain disorders involving the immune mechanism: Secondary | ICD-10-CM | POA: Diagnosis not present

## 2017-12-29 DIAGNOSIS — M7989 Other specified soft tissue disorders: Secondary | ICD-10-CM

## 2017-12-29 NOTE — Patient Instructions (Addendum)
  I will contact you with your lab results when they come back. Please make an appointment with your hematologist  Thank you for coming in today. I hope you feel we met your needs.  Feel free to call PCP if you have any questions or further requests.  Please consider signing up for MyChart if you do not already have it, as this is a great way to communicate with me.  Best,  Whitney McVey, PA-C  IF you received an x-ray today, you will receive an invoice from [facility]. Please contact [facility] at [phone] with questions or concerns regarding your invoice.   IF you received labwork today, you will receive an invoice from [place]. Please contact [facility] at [phone] with questions or concerns regarding your invoice.   Our billing staff will not be able to assist you with questions regarding bills from these companies.  You will be contacted with the lab results as soon as they are available. The fastest way to get your results is to activate your My Chart account. Instructions are located on the last page of this paperwork. If you have not heard from [place] regarding the results in 2 weeks, please contact this office.

## 2017-12-29 NOTE — Progress Notes (Signed)
Sheri Robinson  MRN: [id number] DOB: [birth year]  PCP: Leonie Douglas, PA-C  Subjective:  Pt is a 37 year old female PMH obesity, deep vein thrombosis in 2014 who presents to clinic for several complaints.  Swelling from knee down x 2 weeks - Not as bad when she wakes up in the morning. Bad after working all day.   She props her legs up, which helps. Denies cough, shob, pain, unilateral LES swelling, redness, warmth. LES shooting pains occasionally a few x/week x 2 weeks.  Feeling bloated x 2 weeks  "my hair is shedding really bad, my skin is really bad. I can sleep all day and still be exhausted." endorses feeling constipated recently.   H/o vitamin D deficiency - Ran out of Vitamin D in March H/o blood clots and anemia - she was supposed to go to hematologist but had problems with insurance.   LMP June 8. Denies heavy periods.  Review of Systems  Constitutional: Positive for fatigue. Negative for diaphoresis and fever.  Cardiovascular: Positive for leg swelling (b/l). Negative for chest pain and palpitations.  Gastrointestinal: Positive for abdominal distention (feeling bloated. ) and constipation. Negative for abdominal pain, diarrhea and vomiting.  Endocrine: Negative for cold intolerance and heat intolerance.  Skin: Negative.   Neurological: Positive for dizziness. Negative for light-headedness.    Patient Active Problem List   Diagnosis Date Noted  . Essential hypertension 02/02/2017  . Gastroesophageal reflux disease 02/02/2017  . Viral enteritis 12/05/2016  . Gallstones 04/05/2016    Current Outpatient Medications on File Prior to Visit  Medication Sig Dispense Refill  . amLODipine (NORVASC) 10 MG tablet Take 1 tablet (10 mg total) daily by mouth. 90 tablet 3  . azelastine (ASTELIN) 0.1 % nasal spray Place 2 sprays into both nostrils 2 (two) times daily. Use in each nostril as directed 30 mL 0  . Blood Pressure Monitoring (BLOOD PRESSURE CUFF) MISC 1 Device by  Does not apply route daily. 1 each 0  . budesonide-formoterol (SYMBICORT) 160-4.5 MCG/ACT inhaler Inhale 2 puffs into the lungs 2 (two) times daily. 1 Inhaler 6  . cyclobenzaprine (FLEXERIL) 10 MG tablet Take 0.5 tablets (5 mg total) by mouth 3 (three) times daily as needed for muscle spasms. 30 tablet 0  . levalbuterol (XOPENEX HFA) 45 MCG/ACT inhaler Inhale 2 puffs into the lungs every 6 (six) hours as needed for wheezing or shortness of breath. 1 Inhaler 12  . lisinopril (PRINIVIL,ZESTRIL) 10 MG tablet Take 1 tablet (10 mg total) daily by mouth. 90 tablet 3  . naproxen sodium (ANAPROX) 550 MG tablet Take 550 mg by mouth as needed for mild pain.    Marland Kitchen omeprazole (PRILOSEC) 20 MG capsule Take 1 capsule (20 mg total) daily by mouth. 90 capsule 3  . valACYclovir (VALTREX) 500 MG tablet   2  . baclofen (LIORESAL) 10 MG tablet   0  . Guaifenesin (MUCINEX MAXIMUM STRENGTH) 1200 MG TB12 Take 1 tablet (1,200 mg total) by mouth every 12 (twelve) hours as needed. (Patient not taking: Reported on 12/29/2017) 14 tablet 1  . montelukast (SINGULAIR) 10 MG tablet Take 1 tablet (10 mg total) by mouth at bedtime. (Patient not taking: Reported on 12/29/2017) 30 tablet 5  . Vitamin D, Ergocalciferol, (DRISDOL) 50000 units CAPS capsule Take 1 capsule (50,000 Units total) by mouth every 7 (seven) days. (Patient not taking: Reported on 12/29/2017) 30 capsule 1  . zonisamide (ZONEGRAN) 25 MG capsule   1   No current  facility-administered medications on file prior to visit.     Allergies  Allergen Reactions  . Amoxicillin Anaphylaxis  . Penicillins Anaphylaxis     Objective:  BP 114/82 (BP Location: Left Arm, Patient Position: Sitting, Cuff Size: Large)   Pulse 78   Temp 98.6 F (37 C) (Oral)   Resp 16   Ht _0  (1.702 m)   Wt 266 lb 3.2 oz (120.7 kg)   LMP 12/01/2017   SpO2 100%   BMI 41.69 kg/m   Physical Exam  Constitutional: She is oriented to person, place, and time. No distress.  Cardiovascular:  Normal rate, regular rhythm and normal heart sounds.  Musculoskeletal:       Right ankle: She exhibits swelling (mild).       Left ankle: She exhibits swelling (mild).       Right lower leg: She exhibits no swelling and no edema.       Left lower leg: She exhibits no swelling and no edema.  No discrepancy in circumference lower extremities. No warmth or erythema.   Neurological: She is alert and oriented to person, place, and time.  Skin: Skin is warm and dry.  Psychiatric: Judgment normal.  Vitals reviewed.   Assessment and Plan :  1. Swelling of both lower extremities -Patient presents complaining of swelling of both lower legs.  Physical exam is mild today no concern for DVT.  She plans to make appointment with hematologist.  Labs are pending we will contact with results. - CBC with Differential/Platelet - TSH - CMP14+EGFR - Iron, TIBC and Ferritin Panel - Lipid panel  2. History of vitamin D deficiency - VITAMIN D 25 Hydroxy (Vit-D Deficiency, Fractures)  3. History of anemia - Pathologist smear review  Mercer Pod, PA-C  [facility] 12/29/2017 2:49 PM

## 2017-12-31 LAB — IRON,TIBC AND FERRITIN PANEL
Ferritin: 22 ng/mL (ref 15–150)
Iron Saturation: 12 % — ABNORMAL LOW (ref 15–55)
Iron: 44 ug/dL (ref 27–159)
Total Iron Binding Capacity: 363 ug/dL (ref 250–450)
UIBC: 319 ug/dL (ref 131–425)

## 2017-12-31 LAB — CBC WITH DIFFERENTIAL/PLATELET
Basophils Absolute: 0 10*3/uL (ref 0.0–0.2)
Basos: 0 %
EOS (ABSOLUTE): 0.1 10*3/uL (ref 0.0–0.4)
Eos: 1 %
Hematocrit: 35.1 % (ref 34.0–46.6)
Hemoglobin: 11.2 g/dL (ref 11.1–15.9)
Immature Grans (Abs): 0 10*3/uL (ref 0.0–0.1)
Immature Granulocytes: 0 %
Lymphocytes Absolute: 1.6 10*3/uL (ref 0.7–3.1)
Lymphs: 22 %
MCH: 27.7 pg (ref 26.6–33.0)
MCHC: 31.9 g/dL (ref 31.5–35.7)
MCV: 87 fL (ref 79–97)
Monocytes Absolute: 0.7 10*3/uL (ref 0.1–0.9)
Monocytes: 10 %
Neutrophils Absolute: 4.9 10*3/uL (ref 1.4–7.0)
Neutrophils: 67 %
Platelets: 314 10*3/uL (ref 150–450)
RBC: 4.05 x10E6/uL (ref 3.77–5.28)
RDW: 15.6 % — ABNORMAL HIGH (ref 12.3–15.4)
WBC: 7.3 10*3/uL (ref 3.4–10.8)

## 2017-12-31 LAB — CMP14+EGFR
ALT: 12 IU/L (ref 0–32)
AST: 18 IU/L (ref 0–40)
Albumin/Globulin Ratio: 1.5 (ref 1.2–2.2)
Albumin: 4.3 g/dL (ref 3.5–5.5)
Alkaline Phosphatase: 62 IU/L (ref 39–117)
BUN/Creatinine Ratio: 10 (ref 9–23)
BUN: 10 mg/dL (ref 6–20)
Bilirubin Total: <0.2 mg/dL (ref 0.0–1.2)
CO2: 27 mmol/L (ref 20–29)
Calcium: 9.8 mg/dL (ref 8.7–10.2)
Chloride: 99 mmol/L (ref 96–106)
Creatinine, Ser: 1.03 mg/dL — ABNORMAL HIGH (ref 0.57–1.00)
GFR calc Af Amer: 79 mL/min/{1.73_m2} (ref >59)
GFR calc non Af Amer: 69 mL/min/{1.73_m2} (ref >59)
Globulin, Total: 2.9 g/dL (ref 1.5–4.5)
Glucose: 85 mg/dL (ref 65–99)
Potassium: 4.8 mmol/L (ref 3.5–5.2)
Sodium: 139 mmol/L (ref 134–144)
Total Protein: 7.2 g/dL (ref 6.0–8.5)

## 2017-12-31 LAB — LIPID PANEL
Chol/HDL Ratio: 3.2 ratio (ref 0.0–4.4)
Cholesterol, Total: 184 mg/dL (ref 100–199)
HDL: 57 mg/dL (ref >39)
LDL Calculated: 96 mg/dL (ref 0–99)
Triglycerides: 156 mg/dL — ABNORMAL HIGH (ref 0–149)
VLDL Cholesterol Cal: 31 mg/dL (ref 5–40)

## 2017-12-31 LAB — TSH: TSH: 0.972 u[IU]/mL (ref 0.450–4.500)

## 2017-12-31 LAB — VITAMIN D 25 HYDROXY (VIT D DEFICIENCY, FRACTURES): Vit D, 25-Hydroxy: 23.2 ng/mL — ABNORMAL LOW (ref 30.0–100.0)

## 2018-01-02 LAB — PATHOLOGIST SMEAR REVIEW
Basophils Absolute: 0 10*3/uL (ref 0.0–0.2)
Basos: 0 %
EOS (ABSOLUTE): 0.1 10*3/uL (ref 0.0–0.4)
Eos: 1 %
Hematocrit: 35.3 % (ref 34.0–46.6)
Hemoglobin: 11.2 g/dL (ref 11.1–15.9)
Immature Grans (Abs): 0 10*3/uL (ref 0.0–0.1)
Immature Granulocytes: 0 %
Lymphocytes Absolute: 1.6 10*3/uL (ref 0.7–3.1)
Lymphs: 23 %
MCH: 27.8 pg (ref 26.6–33.0)
MCHC: 31.7 g/dL (ref 31.5–35.7)
MCV: 88 fL (ref 79–97)
Monocytes Absolute: 0.6 10*3/uL (ref 0.1–0.9)
Monocytes: 9 %
Neutrophils Absolute: 4.8 10*3/uL (ref 1.4–7.0)
Neutrophils: 67 %
Platelets: 318 10*3/uL (ref 150–450)
RBC: 4.03 x10E6/uL (ref 3.77–5.28)
RDW: 15.5 % — ABNORMAL HIGH (ref 12.3–15.4)
WBC: 7.2 10*3/uL (ref 3.4–10.8)

## 2018-01-03 NOTE — Progress Notes (Signed)
Start taking 600 to 800 units of vitamin D3 daily.  Calcium-All patients should maintain a daily total calcium intake (diet plus supplement) of 1000 mg to 1200 mg. Repeat labs in 3-4 months.

## 2018-01-21 ENCOUNTER — Encounter: Payer: Self-pay | Admitting: Physician Assistant

## 2018-02-23 ENCOUNTER — Encounter: Payer: Self-pay | Admitting: Physician Assistant

## 2018-02-23 ENCOUNTER — Ambulatory Visit (INDEPENDENT_AMBULATORY_CARE_PROVIDER_SITE_OTHER): Payer: BLUE CROSS/BLUE SHIELD | Admitting: Physician Assistant

## 2018-02-23 ENCOUNTER — Other Ambulatory Visit: Payer: Self-pay

## 2018-02-23 VITALS — BP 110/74 | HR 75 | Temp 98.1°F | Resp 18 | Ht 67.72 in | Wt 268.0 lb

## 2018-02-23 DIAGNOSIS — Z23 Encounter for immunization: Secondary | ICD-10-CM | POA: Diagnosis not present

## 2018-02-23 DIAGNOSIS — R609 Edema, unspecified: Secondary | ICD-10-CM | POA: Diagnosis not present

## 2018-02-23 DIAGNOSIS — L309 Dermatitis, unspecified: Secondary | ICD-10-CM | POA: Diagnosis not present

## 2018-02-23 MED ORDER — TRIAMCINOLONE ACETONIDE 0.5 % EX CREA: 1.0000 "application " | TOPICAL_CREAM | Freq: Three times a day (TID) | CUTANEOUS | 0 refills | Status: DC

## 2018-02-23 NOTE — Patient Instructions (Addendum)
For lower leg swelling, use compression stockings on work days. Try buying a mild or medium strength one. Also make sure you are elevating them at night time!  mild (8-15 mmHg), Medium (15-20 mmHg), Firm (20-30 mmHg), X-Firm (30-40 mmHg  General measures for eczema - In clinical experience, avoidance of irritants or exacerbating factors is beneficial for most patients with dyshidrotic eczema. General skin care measures aimed at reducing skin irritation and restoring the skin barrier include:  Marland Kitchen Using lukewarm water and soap-free cleansers to wash hands  . Drying hands thoroughly after washing  . Applying emollients (eg, petroleum jelly) immediately after hand drying and as often as possible  . Wearing cotton gloves under vinyl or other nonlatex gloves when performing wet work  . Removing rings and watches and bracelets before wet work  . Wearing protective gloves in cold weather  . Wearing task-specific gloves for frictional exposures (eg, gardening, carpentry)  . Avoiding exposure to irritants (eg, detergents, solvents, hair lotions or dyes, acidic foods [eg, citrus fruit])  In clinical practice, astringent solutions such as aluminum subacetate (Burow's solution) or witch hazel are used for wet, weeping skin. Hands or feet are soaked in the solution for 15 minutes two to four times per day.  For flare ups, apply the triamcinolone cream to neck and over the counter hydrocortisone cream on the face until those rashes are completely gone and then switch over to good hypoallergenic thick moisturizer cream such as Eucerin, Cedaphil, or Aquaphor and apply this continually twice a day - especially immediately after showering to prevent it from coming back. Do not use triamcinolone on face or genital regions.   I have placed referral to dermatology and they should contact you within a few weeks. Thank you for letting me participate in your health and well being.   If you have lab work done today you will  be contacted with your lab results within the next 2 weeks.  If you have not heard from [place] then please contact us. The fastest way to get your results is to register for My Chart.   IF you received an x-ray today, you will receive an invoice from [facility]. Please contact [facility] at [phone] with questions or concerns regarding your invoice.   IF you received labwork today, you will receive an invoice from [place]. Please contact [facility] at [phone] with questions or concerns regarding your invoice.   Our billing staff will not be able to assist you with questions regarding bills from these companies.  You will be contacted with the lab results as soon as they are available. The fastest way to get your results is to activate your My Chart account. Instructions are located on the last page of this paperwork. If you have not heard from [place] regarding the results in 2 weeks, please contact this office.

## 2018-02-23 NOTE — Progress Notes (Signed)
Sheri Robinson  MRN: [id number] DOB: 08/20/1980  Subjective:  Sheri Robinson is a 37 y.o. female seen in office today for a chief complaint of multiple complaints.  1) eczema: Would like referral to new dermatologist.  Currently seeing a dermatologist at [facility] dermatology and does not like treatment plans.  Notes that she would just be given a bag of creams to try for her eczema and does not feel like they are actually listening to her.  She has dealt with eczema her entire life.  Most involved spots include back of neck and face.  Currently has active flare on the back of her neck.  Notes it is itching a lot.  Has been applying daily moisturizer, similar to Vaseline.  Not currently using any steroid creams.  Has been given prescriptions for desonide cream and betamethasone ointment in the past and notes they did not work for her.  She is not sure if she has tried any other creams.  Denies fever, chills, nausea, vomiting.  2) BLE swelling x2 months.  Was evaluated by PA McVey on 12/25/2017 for this.  Labs were obtained.  Vitamin D and iron were mildly low.  Notes she has been supplementing with daily vitamin D.  Was having some lower extremity cramping before she started vitamin D supplementation, but that has since improved.  Notes the swelling is not really there in the morning but is worse throughout the day.  Does not mix of walking and sitting at work.  Has tried compression stockings for couple days but they were very uncomfortable so she stopped wearing them.  Elevation does help.  Drinking lots of water.  Avoiding salty foods.  Denies chest pain, shortness of breath, calf pain, redness, warmth, and unilateral swelling.  3) needs flu shot.  Feeling very well today.  Denies fever, cough, chills, nausea, vomiting, abdominal pain, sore throat.  Review of Systems  Per HPI  Patient Active Problem List   Diagnosis Date Noted  . Essential hypertension 02/02/2017  . Gastroesophageal reflux  disease 02/02/2017  . Viral enteritis 12/05/2016  . Gallstones 04/05/2016    Current Outpatient Medications on File Prior to Visit  Medication Sig Dispense Refill  . amLODipine (NORVASC) 10 MG tablet Take 1 tablet (10 mg total) daily by mouth. 90 tablet 3  . budesonide-formoterol (SYMBICORT) 160-4.5 MCG/ACT inhaler Inhale 2 puffs into the lungs 2 (two) times daily. 1 Inhaler 6  . levalbuterol (XOPENEX HFA) 45 MCG/ACT inhaler Inhale 2 puffs into the lungs every 6 (six) hours as needed for wheezing or shortness of breath. 1 Inhaler 12  . lisinopril (PRINIVIL,ZESTRIL) 10 MG tablet Take 1 tablet (10 mg total) daily by mouth. 90 tablet 3  . naproxen sodium (ANAPROX) 550 MG tablet Take 550 mg by mouth as needed for mild pain.    Marland Kitchen omeprazole (PRILOSEC) 20 MG capsule Take 1 capsule (20 mg total) daily by mouth. 90 capsule 3  . valACYclovir (VALTREX) 500 MG tablet   2   No current facility-administered medications on file prior to visit.     Allergies  Allergen Reactions  . Amoxicillin Anaphylaxis  . Penicillins Anaphylaxis      Social History   Socioeconomic History  . Marital status: Single    Spouse name: Not on file  . Number of children: Not on file  . Years of education: Not on file  . Highest education level: Not on file  Occupational History  . Occupation: Therapist, art  Social Needs  .  Financial resource strain: Not on file  . Food insecurity:    Worry: Not on file    Inability: Not on file  . Transportation needs:    Medical: Not on file    Non-medical: Not on file  Tobacco Use  . Smoking status: Never Smoker  . Smokeless tobacco: Never Used  Substance and Sexual Activity  . Alcohol use: No    Alcohol/week: 0.0 standard drinks  . Drug use: No  . Sexual activity: Yes  Lifestyle  . Physical activity:    Days per week: Not on file    Minutes per session: Not on file  . Stress: Not on file  Relationships  . Social connections:    Talks on phone: Not on file      Gets together: Not on file    Attends religious service: Not on file    Active member of club or organization: Not on file    Attends meetings of clubs or organizations: Not on file    Relationship status: Not on file  . Intimate partner violence:    Fear of current or ex partner: Not on file    Emotionally abused: Not on file    Physically abused: Not on file    Forced sexual activity: Not on file  Other Topics Concern  . Not on file  Social History Narrative  . Not on file    Objective:  BP 110/74   Pulse 75   Temp 98.1 F (36.7 C) (Oral)   Resp 18   Ht 5' 7.72" (1.72 m)   Wt 268 lb (121.6 kg)   LMP 02/23/2018   SpO2 98%   BMI 41.09 kg/m   Physical Exam  Constitutional: She is oriented to person, place, and time. She appears well-developed and well-nourished. No distress.  HENT:  Head: Normocephalic and atraumatic.  Eyes: Conjunctivae are normal.  Neck: Normal range of motion.  Pulmonary/Chest: Effort normal.  Musculoskeletal:       Right lower leg: She exhibits edema (1+ to knee). She exhibits no tenderness and no bony tenderness.       Left lower leg: She exhibits edema (1+ to knee). She exhibits no tenderness and no bony tenderness.  Calf measures 18 in bilaterally.  Neurological: She is alert and oriented to person, place, and time.  Skin: Skin is warm and dry.  Large mildly erythematous hyperpigmented plaque noted on posterior neck. Few patches noted on face, no surrounding erythema.    No erythema or warmth of bilateral lower extremities.  Psychiatric: She has a normal mood and affect.  Vitals reviewed.   Assessment and Plan :  1. Eczema, unspecified type Given information for general measures of treatment for eczema.  Recommend over-the-counter hydrocortisone for face and prescribed triamcinolone cream for neck flareups.  Have replaced referral for dermatology.  Follow-up with them as needed. - triamcinolone cream (KENALOG) 0.5 %; Apply 1 application  topically 3 (three) times daily.  Dispense: 30 g; Refill: 0 - Ambulatory referral to Dermatology  2. Dependent edema History and physical exam findings consistent with dependent edema.  Labs reviewed from last visit, no acute pertinent findings.  Recommended decreasing salt intake, increasing water intake, elevation, and daily compression stockings while at work.  Of note, patient is on amlodipine for HTN.  Had discussion with patient that a side effect of this medication is peripheral edema.  Considering her blood pressure is very well controlled, can attempt trial off of amlodipine to see if swelling  improves.  Recommend continued monitoring blood pressure.  Goal is 130/80.  Follow-up as needed.  3. Need for influenza vaccination - Flu Vaccine QUAD 36+ mos IM  Sheri Delaine PA-C  [facility] at [facility] 02/23/2018 4:23 PM

## 2018-04-20 ENCOUNTER — Other Ambulatory Visit: Payer: Self-pay | Admitting: Physician Assistant

## 2018-04-20 DIAGNOSIS — I10 Essential (primary) hypertension: Secondary | ICD-10-CM

## 2018-04-22 NOTE — Telephone Encounter (Signed)
Requested Prescriptions  Pending Prescriptions Disp Refills  . lisinopril (PRINIVIL,ZESTRIL) 10 MG tablet [Pharmacy Med Name: LISINOPRIL 10 MG TABLET] 90 tablet 0    Sig: TAKE 1 TABLET BY MOUTH EVERY DAY     Cardiovascular:  ACE Inhibitors Failed - 04/20/2018  9:31 AM      Failed - Cr in normal range and within 180 days    Creat  Date Value Ref Range Status  04/01/2016 0.98 0.50 - 1.10 mg/dL Final   Creatinine, Ser  Date Value Ref Range Status  12/29/2017 1.03 (H) 0.57 - 1.00 mg/dL Final         Passed - K in normal range and within 180 days    Potassium  Date Value Ref Range Status  12/29/2017 4.8 3.5 - 5.2 mmol/L Final         Passed - Patient is not pregnant      Passed - Last BP in normal range    BP Readings from Last 1 Encounters:  02/23/18 110/74         Passed - Valid encounter within last 6 months    Recent Outpatient Visits          1 month ago Eczema, unspecified type   [facility] at [place], [place] D, PA-C   3 months ago Swelling of both lower extremities   Primary Care at [facility], [place], PA-C   8 months ago Viral URI with cough   Primary Care at [facility], [place], [place]   11 months ago Essential hypertension   [facility] at [place], [place] D, PA-C   1 year ago Encounter for biometric screening   Primary Care at [place], [place] D, PA-C           . amLODipine ([place]) 10 MG tablet [Pharmacy Med Name: AMLODIPINE BESYLATE 10 MG TAB] 90 tablet 0    Sig: TAKE 1 TABLET BY MOUTH EVERY DAY     Cardiovascular:  Calcium Channel Blockers Passed - 04/20/2018  9:31 AM      Passed - Last BP in normal range    BP Readings from Last 1 Encounters:  02/23/18 110/74         Passed - Valid encounter within last 6 months    Recent Outpatient Visits          1 month ago Eczema, unspecified type   [facility] at [place], [place] D, PA-C   3 months ago Swelling of both lower extremities   Primary Care at  [facility], [place], PA-C   8 months ago Viral URI with cough   Primary Care at [facility], [place], [place]   11 months ago Essential hypertension   [facility] at [place], [place] D, PA-C   1 year ago Encounter for biometric screening   Primary Care at [place], [place] D, [place]

## 2018-05-11 ENCOUNTER — Ambulatory Visit (INDEPENDENT_AMBULATORY_CARE_PROVIDER_SITE_OTHER): Payer: BLUE CROSS/BLUE SHIELD | Admitting: Family Medicine

## 2018-05-11 DIAGNOSIS — Z23 Encounter for immunization: Secondary | ICD-10-CM

## 2018-05-27 ENCOUNTER — Encounter: Payer: Self-pay | Admitting: *Deleted

## 2018-07-07 ENCOUNTER — Encounter: Payer: Self-pay | Admitting: Physician Assistant

## 2018-07-12 ENCOUNTER — Ambulatory Visit: Payer: Self-pay

## 2018-07-12 NOTE — Telephone Encounter (Signed)
Patient called in with c/o "SOB." She says "this has been ongoing for over a week. I ended up going to an UC last Tuesday, was given steroids to take and told to use my inhaler. I did that and the steroids didn't help my cough. I used my inhaler and I ran out with no refills. I called the pulmonologist office and they said call the pharmacy. I did and waited for a week. When I called the pharmacy, they told me the doctor denied it. I called the office and was told since I haven't been seen in over a year there, I would have to go to my primary. I know [place] is no longer there and I tried to make an appointment, but was told it would be February before anyone would see me as a new patient." I advised the patient there are no appointments at the office for today or tomorrow and because she's SOB with wheezing, coughing every few words, she will need to go back to the UC and they will give a prescription for an inhaler. I advised [facility] on [facility] and advised to either call her or another primary practice to establish with a new provider, so that she can be referred back to the pulmonologist office, she verbalized understanding.   Reason for Disposition . [1] Continuous (nonstop) coughing AND [2] keeps from working or sleeping  Answer Assessment - Initial Assessment Questions 1. RESPIRATORY STATUS: "Describe your breathing?" (e.g., wheezing, shortness of breath, unable to speak, severe coughing)      Wheezing, shortness of breath, coughing 2. ONSET: "When did this breathing problem begin?"      1 week or more 3. PATTERN "Does the difficult breathing come and go, or has it been constant since it started?"      Constant 4. SEVERITY: "How bad is your breathing?" (e.g., mild, moderate, severe)    - MILD: No SOB at rest, mild SOB with walking, speaks normally in sentences, can lay down, no retractions, pulse < 100.    - MODERATE: SOB at rest, SOB with minimal exertion and prefers to sit, cannot  lie down flat, speaks in phrases, mild retractions, audible wheezing, pulse 100-120.    - SEVERE: Very SOB at rest, speaks in single words, struggling to breathe, sitting hunched forward, retractions, pulse > 120      Moderate-severe 5. RECURRENT SYMPTOM: "Have you had difficulty breathing before?" If so, ask: "When was the last time?" and "What happened that time?"      Yes 6. CARDIAC HISTORY: "Do you have any history of heart disease?" (e.g., heart attack, angina, bypass surgery, angioplasty)      No 7. LUNG HISTORY: "Do you have any history of lung disease?"  (e.g., pulmonary embolus, asthma, emphysema)     Asthma 8. CAUSE: "What do you think is causing the breathing problem?"      Chest cold 9. OTHER SYMPTOMS: "Do you have any other symptoms? (e.g., dizziness, runny nose, cough, chest pain, fever)     Cough 10. PREGNANCY: "Is there any chance you are pregnant?" "When was your last menstrual period?"       No 11. TRAVEL: "Have you traveled out of the country in the last month?" (e.g., travel history, exposures)       No  Protocols used: BREATHING DIFFICULTY-A-AH

## 2018-09-23 ENCOUNTER — Other Ambulatory Visit: Payer: Self-pay | Admitting: Physician Assistant

## 2018-09-23 DIAGNOSIS — I10 Essential (primary) hypertension: Secondary | ICD-10-CM

## 2018-09-23 NOTE — Telephone Encounter (Signed)
Refill request for amlodipine; no valid encounter in last 6 months; last refill 05/25/18; no upcoming visits noted; attempted to contact pt; received pre-recorded message stating the number is not in service; will route to office for final disposition.

## 2018-09-24 NOTE — Telephone Encounter (Signed)
No further refills without office visit 

## 2018-09-26 ENCOUNTER — Other Ambulatory Visit: Payer: Self-pay | Admitting: Physician Assistant

## 2018-09-26 DIAGNOSIS — I10 Essential (primary) hypertension: Secondary | ICD-10-CM

## 2018-09-26 NOTE — Telephone Encounter (Signed)
Gave pt 30 day supply of meds ,pt needs a appt.

## 2018-10-23 ENCOUNTER — Other Ambulatory Visit: Payer: Self-pay | Admitting: Physician Assistant

## 2018-10-23 ENCOUNTER — Other Ambulatory Visit: Payer: Self-pay | Admitting: Family Medicine

## 2018-10-23 DIAGNOSIS — I10 Essential (primary) hypertension: Secondary | ICD-10-CM

## 2018-11-02 ENCOUNTER — Other Ambulatory Visit: Payer: Self-pay | Admitting: Family Medicine

## 2018-11-02 DIAGNOSIS — I10 Essential (primary) hypertension: Secondary | ICD-10-CM

## 2019-03-06 ENCOUNTER — Other Ambulatory Visit: Payer: Self-pay | Admitting: Family Medicine

## 2019-03-06 DIAGNOSIS — I10 Essential (primary) hypertension: Secondary | ICD-10-CM

## 2019-03-06 NOTE — Telephone Encounter (Signed)
Forwarding medication refill request to the clinical pool for review. 

## 2019-03-14 ENCOUNTER — Other Ambulatory Visit: Payer: Self-pay

## 2019-03-14 ENCOUNTER — Emergency Department (HOSPITAL_COMMUNITY)
Admission: EM | Admit: 2019-03-14 | Discharge: 2019-03-14 | Disposition: A | Discharge status: Home / Self Care | Payer: BLUE CROSS/BLUE SHIELD | Attending: Emergency Medicine | Admitting: Emergency Medicine

## 2019-03-14 ENCOUNTER — Encounter (HOSPITAL_COMMUNITY): Payer: Self-pay

## 2019-03-14 DIAGNOSIS — Z79899 Other long term (current) drug therapy: Secondary | ICD-10-CM | POA: Diagnosis not present

## 2019-03-14 DIAGNOSIS — N3000 Acute cystitis without hematuria: Secondary | ICD-10-CM | POA: Insufficient documentation

## 2019-03-14 DIAGNOSIS — I10 Essential (primary) hypertension: Secondary | ICD-10-CM | POA: Insufficient documentation

## 2019-03-14 DIAGNOSIS — M5431 Sciatica, right side: Secondary | ICD-10-CM | POA: Diagnosis not present

## 2019-03-14 DIAGNOSIS — J45909 Unspecified asthma, uncomplicated: Secondary | ICD-10-CM | POA: Diagnosis not present

## 2019-03-14 DIAGNOSIS — M549 Dorsalgia, unspecified: Secondary | ICD-10-CM | POA: Diagnosis present

## 2019-03-14 LAB — URINALYSIS, ROUTINE W REFLEX MICROSCOPIC
Bacteria, UA: NONE SEEN
Bilirubin Urine: NEGATIVE
Glucose, UA: NEGATIVE mg/dL
Hgb urine dipstick: NEGATIVE
Ketones, ur: NEGATIVE mg/dL
Nitrite: NEGATIVE
Protein, ur: NEGATIVE mg/dL
Specific Gravity, Urine: 1.025 (ref 1.005–1.030)
pH: 6 (ref 5.0–8.0)

## 2019-03-14 LAB — PREGNANCY, URINE: Preg Test, Ur: NEGATIVE

## 2019-03-14 MED ORDER — METHOCARBAMOL 500 MG PO TABS: 500.0000 mg | ORAL_TABLET | Freq: Two times a day (BID) | ORAL | 0 refills | Status: DC

## 2019-03-14 MED ORDER — PHENAZOPYRIDINE HCL 200 MG PO TABS
200.0000 mg | ORAL_TABLET | Freq: Three times a day (TID) | ORAL | Status: DC
  Administered 2019-03-14: 200 mg via ORAL
  Filled 2019-03-14: qty 1

## 2019-03-14 MED ORDER — METHOCARBAMOL 500 MG PO TABS
1000.0000 mg | ORAL_TABLET | Freq: Once | ORAL | Status: AC
  Administered 2019-03-14: 1000 mg via ORAL
  Filled 2019-03-14: qty 2

## 2019-03-14 MED ORDER — NITROFURANTOIN MONOHYD MACRO 100 MG PO CAPS
100.0000 mg | ORAL_CAPSULE | Freq: Once | ORAL | Status: AC
  Administered 2019-03-14: 04:00:00 100 mg via ORAL
  Filled 2019-03-14: qty 1

## 2019-03-14 MED ORDER — PREDNISONE 20 MG PO TABS: ORAL_TABLET | ORAL | 0 refills | Status: DC

## 2019-03-14 MED ORDER — LIDOCAINE 5 % EX PTCH
3.0000 | MEDICATED_PATCH | CUTANEOUS | Status: DC
Start: 2019-03-14 — End: 2019-03-14
  Administered 2019-03-14: 3 via TRANSDERMAL
  Filled 2019-03-14: qty 3

## 2019-03-14 MED ORDER — NITROFURANTOIN MONOHYD MACRO 100 MG PO CAPS: 100.0000 mg | ORAL_CAPSULE | Freq: Two times a day (BID) | ORAL | 0 refills | Status: DC

## 2019-03-14 MED ORDER — PREDNISONE 20 MG PO TABS
60.0000 mg | ORAL_TABLET | Freq: Once | ORAL | Status: AC
  Administered 2019-03-14: 60 mg via ORAL
  Filled 2019-03-14: qty 3

## 2019-03-14 MED ORDER — ACETAMINOPHEN 500 MG PO TABS
1000.0000 mg | ORAL_TABLET | Freq: Once | ORAL | Status: AC
  Administered 2019-03-14: 02:00:00 1000 mg via ORAL
  Filled 2019-03-14: qty 2

## 2019-03-14 MED ORDER — KETOROLAC TROMETHAMINE 60 MG/2ML IM SOLN
60.0000 mg | Freq: Once | INTRAMUSCULAR | Status: AC
  Administered 2019-03-14: 02:00:00 60 mg via INTRAMUSCULAR
  Filled 2019-03-14: qty 2

## 2019-03-14 MED ORDER — MELOXICAM 15 MG PO TABS: 15.0000 mg | ORAL_TABLET | Freq: Every day | ORAL | 0 refills | Status: DC

## 2019-03-14 NOTE — ED Notes (Signed)
Pt currently providing urine sample. Will finish triage following.

## 2019-03-14 NOTE — ED Triage Notes (Signed)
Per EMS, Pt called EMS for sciatica pain in the right hip. Been hurting since 8am.

## 2019-03-14 NOTE — ED Provider Notes (Addendum)
[facility] DEPT Provider Note   CSN: [id number] Arrival date & time: 03/14/19  0104     History   Chief Complaint Chief Complaint  Patient presents with  . Back Pain    HPI Sheri Robinson is a 38 y.o. female.     The history is provided by the patient.  Back Pain Location:  Gluteal region Quality:  Shooting Radiates to:  R thigh Pain severity:  Severe Pain is:  Same all the time Onset quality:  Gradual Timing:  Constant Chronicity:  Recurrent Context: not emotional stress, not falling, not jumping from heights, not lifting heavy objects, not MCA, not MVA, not occupational injury, not pedestrian accident, not recent illness, not recent injury and not twisting   Relieved by:  Nothing Worsened by:  Nothing Ineffective treatments:  None tried Associated symptoms: no abdominal pain, no abdominal swelling, no bladder incontinence, no bowel incontinence, no chest pain, no dysuria, no fever, no headaches, no leg pain, no numbness, no paresthesias, no pelvic pain, no perianal numbness, no tingling, no weakness and no weight loss   Risk factors: no hx of cancer, not pregnant and no vascular disease   Patient with known sciatica for 10 years presents with an exacerbation of same.  Pain starts in the R buttock and radiates down the posterior thigh.  No distal leg pain no s  Past Medical History:  Diagnosis Date  . Anemia   . Arthritis   . Asthma   . Community acquired pneumonia of left lung 12/26/2016   improving  . Cough 12/26/2016  . DVT (deep venous thrombosis) ([place])   . GERD (gastroesophageal reflux disease)   . Heart murmur    echo done 2014-[facility]- neg  . Hypertension   . Miscarriage   . Miscarriage 2013   had fibroids- lost blood- 3 tranfusions- developed dvt, pe 2014  . Peripheral vascular disease ([place]) 2014   pe, dvt  left leg   rx          . Pneumonia   . Pulmonary embolism [facility])     Patient Active Problem List   Diagnosis Date Noted  . Essential hypertension 02/02/2017  . Gastroesophageal reflux disease 02/02/2017  . Viral enteritis 12/05/2016  . Gallstones 04/05/2016    Past Surgical History:  Procedure Laterality Date  . CHOLECYSTECTOMY N/A 04/05/2016   Procedure: LAPAROSCOPIC CHOLECYSTECTOMY WITH INTRAOPERATIVE CHOLANGIOGRAM;  Surgeon: Autumn Messing III, MD;  Location: [place];  Service: General;  Laterality: N/A;  . IVC FILTER PLACEMENT ([place] [facility])  2014   removed 5 2014  . MYOMECTOMY       OB History   No obstetric history on file.      Home Medications    Prior to Admission medications   Medication Sig Start Date End Date Taking? Authorizing Provider  amLODipine (NORVASC) 10 MG tablet TAKE 1 TABLET BY MOUTH EVERY DAY 10/23/18   Delia Chimes A, MD  budesonide-formoterol (SYMBICORT) 160-4.5 MCG/ACT inhaler Inhale 2 puffs into the lungs 2 (two) times daily. 01/24/17   Chesley Mires, MD  levalbuterol [facility] HFA) 45 MCG/ACT inhaler Inhale 2 puffs into the lungs every 6 (six) hours as needed for wheezing or shortness of breath. 05/22/17   Chesley Mires, MD  lisinopril (ZESTRIL) 10 MG tablet TAKE 1 TABLET BY MOUTH EVERY DAY 10/23/18   Delia Chimes A, MD  naproxen sodium (ANAPROX) 550 MG tablet Take 550 mg by mouth as needed for mild pain.  [provider]  omeprazole (PRILOSEC) 20 MG capsule Take 1 capsule (20 mg total) daily by mouth. 05/08/17   [name] D, PA-C  triamcinolone cream (KENALOG) 0.5 % Apply 1 application topically 3 (three) times daily. 02/23/18   Leonie Douglas, PA-C  valACYclovir (VALTREX) 500 MG tablet  04/09/17   [provider]    Family History Family History  Problem Relation Age of Onset  . Hypertension Mother   . Diabetes Mother   . Stroke Father     Social History Social History   Tobacco Use  . Smoking status: Never Smoker  . Smokeless tobacco: Never Used  Substance Use Topics  . Alcohol use: No    Alcohol/week: 0.0  standard drinks  . Drug use: No     Allergies   Amoxicillin and Penicillins   Review of Systems Review of Systems  Constitutional: Negative for fever and weight loss.  Respiratory: Negative for cough and shortness of breath.   Cardiovascular: Negative for chest pain.  Gastrointestinal: Negative for abdominal pain and bowel incontinence.  Genitourinary: Negative for bladder incontinence, dysuria and pelvic pain.  Musculoskeletal: Positive for back pain.  Neurological: Negative for tingling, weakness, numbness, headaches and paresthesias.  Psychiatric/Behavioral: Negative for agitation.  All other systems reviewed and are negative.    Physical Exam Updated Vital Signs BP (!) 117/100 (BP Location: Right Arm)   Pulse 94   Temp 98.4 F (36.9 C) (Oral)   Resp (!) 23   Ht 5\' 8"  (1.727 m)   Wt 117.9 kg   SpO2 100%   BMI 39.53 kg/m   Physical Exam Vitals signs and nursing note reviewed.  Constitutional:      General: She is not in acute distress.    Appearance: Normal appearance.  HENT:     Head: Normocephalic and atraumatic.     Nose: Nose normal.  Eyes:     Conjunctiva/sclera: Conjunctivae normal.     Pupils: Pupils are equal, round, and reactive to light.  Neck:     Musculoskeletal: Normal range of motion and neck supple.  Cardiovascular:     Rate and Rhythm: Normal rate and regular rhythm.     Pulses: Normal pulses.     Heart sounds: Normal heart sounds.  Pulmonary:     Effort: Pulmonary effort is normal.     Breath sounds: Normal breath sounds.  Abdominal:     General: Abdomen is flat. Bowel sounds are normal.     Tenderness: There is no abdominal tenderness. There is no guarding.  Musculoskeletal: Normal range of motion.  Skin:    General: Skin is warm and dry.     Capillary Refill: Capillary refill takes less than 2 seconds.  Neurological:     General: No focal deficit present.     Mental Status: She is alert and oriented to person, place, and time.   Psychiatric:        Mood and Affect: Mood normal.        Behavior: Behavior normal.      ED Treatments / Results  Labs (all labs ordered are listed, but only abnormal results are displayed) Results for orders placed or performed during the hospital encounter of 03/14/19  Urinalysis, Routine w reflex microscopic  Result Value Ref Range   Color, Urine YELLOW YELLOW   APPearance HAZY (A) CLEAR   Specific Gravity, Urine 1.025 1.005 - 1.030   pH 6.0 5.0 - 8.0   Glucose, UA NEGATIVE NEGATIVE mg/dL  Hgb urine dipstick NEGATIVE NEGATIVE   Bilirubin Urine NEGATIVE NEGATIVE   Ketones, ur NEGATIVE NEGATIVE mg/dL   Protein, ur NEGATIVE NEGATIVE mg/dL   Nitrite NEGATIVE NEGATIVE   Leukocytes,Ua LARGE (A) NEGATIVE   RBC / HPF 0-5 0 - 5 RBC/hpf   WBC, UA 11-20 0 - 5 WBC/hpf   Bacteria, UA NONE SEEN NONE SEEN   Squamous Epithelial / LPF 11-20 0 - 5   Amorphous Crystal PRESENT   Pregnancy, urine  Result Value Ref Range   Preg Test, Ur NEGATIVE NEGATIVE   No results found.  Radiology No results found.  Procedures Procedures (including critical care time)  Medications Ordered in ED Medications  lidocaine (LIDODERM) 5 % 3 patch (3 patches Transdermal Patch Applied 03/14/19 0136)  phenazopyridine (PYRIDIUM) tablet 200 mg (200 mg Oral Given 03/14/19 0336)  predniSONE (DELTASONE) tablet 60 mg (60 mg Oral Given 03/14/19 0132)  ketorolac (TORADOL) injection 60 mg (60 mg Intramuscular Given 03/14/19 0137)  acetaminophen (TYLENOL) tablet 1,000 mg (1,000 mg Oral Given 03/14/19 0132)  nitrofurantoin (macrocrystal-monohydrate) (MACROBID) capsule 100 mg (100 mg Oral Given 03/14/19 0336)  methocarbamol (ROBAXIN) tablet 1,000 mg (1,000 mg Oral Given 03/14/19 0336)     Patient has sciatica, she also has a UTI. Will treat with steroids, NSAIDs and muscle relaxants and have started antibiotics for UTI. No weakness nor numbness.   ZYRIHANNA SNOVER was evaluated in Emergency Department on 03/14/2019 for the  symptoms described in the history of present illness. She was evaluated in the context of the global COVID-19 pandemic, which necessitated consideration that the patient might be at risk for infection with the SARS-CoV-2 virus that causes COVID-19. Institutional protocols and algorithms that pertain to the evaluation of patients at risk for COVID-19 are in a state of rapid change based on information released by regulatory bodies including the CDC and federal and state organizations. These policies and algorithms were followed during the patient's care in the ED.   Final Clinical Impressions(s) / ED Diagnoses   Return for intractable cough, coughing up blood,fevers >100.4 unrelieved by medication, shortness of breath, intractable vomiting, chest pain, shortness of breath, weakness,numbness, changes in speech, facial asymmetry,abdominal pain, passing out,Inability to tolerate liquids or food, cough, altered mental status or any concerns. No signs of systemic illness or infection. The patient is nontoxic-appearing on exam and vital signs are within normal limits.   I have reviewed the triage vital signs and the nursing notes. Pertinent labs &imaging results that were available during my care of the patient were reviewed by me and considered in my medical decision making (see chart for details).After history, exam, and medical workup I feel the patient has beenappropriately medically screened and is safe for discharge home. Pertinent diagnoses were discussed with the patient. Patient was given return precautions.    Bob Daversa, MD 03/14/19 [place], Keagan Anthis, MD 05/06/19 [id number]    Veatrice Kells, MD 05/06/19 [id number]

## 2019-03-14 NOTE — ED Notes (Signed)
Urine and culture sent down to lab 

## 2019-05-06 ENCOUNTER — Encounter (HOSPITAL_COMMUNITY): Payer: Self-pay | Admitting: Emergency Medicine

## 2020-10-12 NOTE — Progress Notes (Signed)
New Patient Note  RE: Sheri Robinson MRN: [id number] DOB: 12-11-1980 Date of Office Visit: 10/13/2020  Consult requested by: Gerald Leitz Primary care provider: Andria Frames, PA-C  Chief Complaint: Cough  History of Present Illness: I had the pleasure of seeing Sheri Robinson for initial evaluation at the [facility] on 10/13/2020. She is a 40 y.o. female, who is referred here by Andria Frames, PA-C for the evaluation of cough and allergic rhinitis.  Respiratory: Patient had bronchitis for 1 month recently and symptoms persisted for many weeks. She usually gets pneumonia once a year since 2013 - some are Chest X-ray proven. Concerned about lung issues.   She reports symptoms of chest tightness, shortness of breath, coughing, wheezing, nocturnal awakenings for 9+ years. Current medications include albuterol prn which help. She reports not using aerochamber with inhalers. She tried the following inhalers: Symbicort, Advair. Main triggers are infections, allergies, strong scents. In the last month, frequency of symptoms: 1x/week. Frequency of nocturnal symptoms: depends. Frequency of SABA use: 1x/week. In the last 12 months, emergency room visits/urgent care visits/doctor office visits or hospitalizations due to respiratory issues: 3. In the last 12 months, oral steroids courses: 2. Lifetime history of hospitalization for respiratory issues: yes. Prior intubations: yes in 2013 for double pneumonia. History of pneumonia: yes. She was evaluated by pulmonologist in the past. Smoking exposure: no. Up to date with flu vaccine: yes. Up to date with pneumonia vaccine: yes. Up to date with COVID-19 vaccine: no. Prior Covid-19 infection: no. History of reflux: yes and takes omeprazole.  Rhinitis:  She reports symptoms of swelling/burning/watery/itchy eyes, nasal congestion, rhinorrhea, PND, sneezing. Symptoms have been going on for 6 years. The symptoms are present  all year around with worsening in spring. Anosmia: no. Headache: yes. She has used Flonase, Singulair, OTC antihistamines with minimal improvement in symptoms. Sinus infections: not recently. Previous work up includes: no. Previous ENT evaluation: no. Previous sinus imaging: no. History of nasal polyps: no. Last eye exam: within the past year.  Immune:  Patient has history multiple infections including sinus infection, pneumonia. Denies any ear infections, GI infections/diarrhea, skin infections/abscesses. Patient has no history of opportunistic infections including fungal infections, viral infections.   Patient reports 2 antibiotic use in the last 12 months and 1 hospital admission in 2013 for pneumonia. Patient does not have any secondary causes of immunodeficiency including chronic steroid use, diabetes mellitus, protein losing enteropathy, renal or hepatic dysfunction, history of cancer or irradiation or history of HIV, hepatitis B or C.  Assessment and Plan: Sheri Robinson is a 40 y.o. female with: Reactive airway disease without complication Persistent coughing for 1 month after bronchitis. Usually gets pneumonia/bronchitis once per year since 2013. No prior asthma/copd diagnosis. Using albuterol prn with some benefit.   Today's spirometry showed: possible restrictive disease with no improvement in FEV1 post bronchodilator treatment. Clinically feeling improved.   Patient seems to have some URI induced reactive airway disease.  . Daily controller medication(s): START Breo 184mcg 1 puff daily and rinse mouth after each use. Demonstrated proper use.  . Continue Singulair (montelukast) 10mg  daily at night. . May use albuterol rescue inhaler 2 puffs every 4 to 6 hours as needed for shortness of breath, chest tightness, coughing, and wheezing. May use albuterol rescue inhaler 2 puffs 5 to 15 minutes prior to strenuous physical activities. Monitor frequency of use.   Repeat spirometry at next visit.    Chronic rhinitis Perennial rhino  conjunctivitis symptoms for 6 years with worsening in the spring. Tried Flonase, Singulair and OTC antihistamines with minimal benefit. No prior allergy/ENT evaluation.   Today's skin testing showed: Negative to indoor/outdoor allergens but the positive control was negative questioning the validity of the results.   Patient denies taking any antihistamines recently/  Will double check via bloodwork.  May use over the counter antihistamines such as Zyrtec (cetirizine), Claritin (loratadine), Allegra (fexofenadine), or Xyzal (levocetirizine) daily as needed.  Continue Singulair (montelukast) 10mg  daily at night.  May use Flonase (fluticasone) nasal spray 1 spray per nostril twice a day as needed for nasal congestion.   Nasal saline spray (i.e., Simply Saline) or nasal saline lavage (i.e., NeilMed) is recommended as needed and prior to medicated nasal sprays.  Frequent episodes of bronchitis and pneumonia Frequent bronchitis/pneumonia even requiring hospitalization in 2013. Usually takes 2 courses of antibiotics per year. No prior immune work up.  Keep track of infections and antibiotics use. Get bloodwork to look at immune system.  Recommend annual flu vaccine.   Recommend COVID-19 vaccinations.  Rash and other nonspecific skin eruption  See below for proper skin care.  Follow up with dermatologist and their recommendations.  Drug reaction Hives in 2013 after taking amoxicillin.  Consider penicillin skin testing/drug challenge in the future.  More than 90% of patients outgrow their penicillin allergy.    Return in about 2 months (around 12/13/2020).  Meds ordered this encounter  Medications  . fluticasone furoate-vilanterol (BREO ELLIPTA) 100-25 MCG/INH AEPB    Sig: Inhale 1 puff into the lungs daily. Rinse mouth after each use    Dispense:  60 each    Refill:  3    Lab Orders     CBC with Differential/Platelet     IgG, IgA, IgM      Strep pneumoniae 23 Serotypes IgG     Complement, total     Diphtheria / Tetanus Antibody Panel     Allergens w/Total IgE Area 2  Other allergy screening: Food allergy: no Medication allergy: yes  Amoxicillin - hives in 2013. Hymenoptera allergy: no Urticaria: no Eczema:yes  Follows with dermatology and uses clobetasol for the scalp.   Diagnostics: Spirometry:  Tracings reviewed. Her effort: It was hard to get consistent efforts and there is a question as to whether this reflects a maximal maneuver. FVC: 2.17L FEV1: 1.93L, 68% predicted FEV1/FVC ratio: 89% Interpretation: Spirometry consistent with possible restrictive disease with no improvement in FEV1 post bronchodilator treatment. Clinically feeling improved.   Please see scanned spirometry results for details.  Skin Testing: Environmental allergy panel. Negative to indoor/outdoor allergens but the positive control was negative questioning the validity of the results Results discussed with patient/family.  Airborne Adult Perc - 10/13/20 0905    Time Antigen Placed 0960    Allergen Manufacturer Lavella Hammock    Location Back    Number of Test 59    1. Control-Buffer 50% Glycerol Negative    2. Control-Histamine 1 mg/ml Negative    3. Albumin saline Negative    4. Numidia Negative    5. [place] Negative    6. Johnson Negative    7. [place] Blue Negative    8. Meadow Fescue Negative    9. Perennial Rye Negative    10. Sweet Vernal Negative    11. Timothy Negative    12. Cocklebur Negative    13. Burweed Marshelder Negative    14. Ragweed, short Negative    15. Ragweed, Giant Negative  16. Plantain,  English Negative    17. Lamb's Quarters Negative    18. Sheep Sorrell Negative    19. Rough Pigweed Negative    20. Marsh Elder, Rough Negative    21. Mugwort, Common Negative    22. Ash mix Negative    23. Birch mix Negative    24. Beech American Negative    25. Box, Elder Negative    26. Cedar, red Negative    27.  Cottonwood, [place] Negative    28. Elm mix Negative    29. Hickory Negative    30. Maple mix Negative    31. Oak, [place] mix Negative    32. Pecan Pollen Negative    33. Pine mix Negative    34. Sycamore Eastern Negative    35. Poole, Black Pollen Negative    36. Alternaria alternata Negative    37. Cladosporium Herbarum Negative    38. Aspergillus mix Negative    39. Penicillium mix Negative    40. Bipolaris sorokiniana (Helminthosporium) Negative    41. Drechslera spicifera (Curvularia) Negative    42. Mucor plumbeus Negative    43. Fusarium moniliforme Negative    44. Aureobasidium pullulans (pullulara) Negative    45. Rhizopus oryzae Negative    46. Botrytis cinera Negative    47. Epicoccum nigrum Negative    48. Phoma betae Negative    49. Candida Albicans Negative    50. Trichophyton mentagrophytes Negative    51. Mite, D Farinae  5,000 AU/ml Negative    52. Mite, D Pteronyssinus  5,000 AU/ml Negative    53. Cat Hair 10,000 BAU/ml Negative    54.  Dog Epithelia Negative    55. Mixed Feathers Negative    56. Horse Epithelia Negative    57. Cockroach, German Negative    58. Mouse Negative    59. Tobacco Leaf Negative           Past Medical History: Patient Active Problem List   Diagnosis Date Noted  . Reactive airway disease without complication 16/03/9603  . Frequent episodes of bronchitis and pneumonia 10/13/2020  . Drug reaction 10/13/2020  . Chronic rhinitis 10/13/2020  . Rash and other nonspecific skin eruption 10/13/2020  . Essential hypertension 02/02/2017  . Gastroesophageal reflux disease 02/02/2017  . Viral enteritis 12/05/2016  . Gallstones 04/05/2016   Past Medical History:  Diagnosis Date  . Anemia   . Arthritis   . Asthma   . Community acquired pneumonia of left lung 12/26/2016   improving  . Cough 12/26/2016  . DVT (deep venous thrombosis) ([place])   . Eczema   . GERD (gastroesophageal reflux disease)   . Heart murmur    echo done  2014-[facility]- neg  . Hypertension   . Miscarriage   . Miscarriage 2013   had fibroids- lost blood- 3 tranfusions- developed dvt, pe 2014  . Peripheral vascular disease ([place]) 2014   pe, dvt  left leg   rx          . Pneumonia   . Pulmonary embolism [facility])    Past Surgical History: Past Surgical History:  Procedure Laterality Date  . CHOLECYSTECTOMY N/A 04/05/2016   Procedure: LAPAROSCOPIC CHOLECYSTECTOMY WITH INTRAOPERATIVE CHOLANGIOGRAM;  Surgeon: Autumn Messing III, MD;  Location: [facility];  Service: General;  Laterality: N/A;  . IVC FILTER PLACEMENT ([place] [facility])  2014   removed 5 2014  . MYOMECTOMY     Medication List:  Current Outpatient Medications  Medication Sig  Dispense Refill  . albuterol (PROAIR HFA) 108 (90 Base) MCG/ACT inhaler Inhale into the lungs every 6 (six) hours as needed for wheezing or shortness of breath.    Marland Kitchen amLODipine (NORVASC) 10 MG tablet TAKE 1 TABLET BY MOUTH EVERY DAY 15 tablet 0  . cholestyramine (QUESTRAN) 4 g packet Take 1 packet by mouth daily.    . fluticasone furoate-vilanterol (BREO ELLIPTA) 100-25 MCG/INH AEPB Inhale 1 puff into the lungs daily. Rinse mouth after each use 60 each 3  . hydrOXYzine (ATARAX/VISTARIL) 50 MG tablet Take 50 mg by mouth daily.    . montelukast (SINGULAIR) 10 MG tablet Take 10 mg by mouth at bedtime.    Marland Kitchen olmesartan (BENICAR) 20 MG tablet Take 1 tablet by mouth daily.    Marland Kitchen omeprazole (PRILOSEC) 20 MG capsule Take 1 capsule (20 mg total) daily by mouth. 90 capsule 3  . sertraline (ZOLOFT) 25 MG tablet Take 25 mg by mouth daily.    . valACYclovir (VALTREX) 500 MG tablet   2   No current facility-administered medications for this visit.   Allergies: Allergies  Allergen Reactions  . Amoxicillin Anaphylaxis  . Penicillins Anaphylaxis   Social History: Social History   Socioeconomic History  . Marital status: Single    Spouse name: Not on file  . Number of children: Not on file  . Years of education: Not on  file  . Highest education level: Not on file  Occupational History  . Occupation: Therapist, art  Tobacco Use  . Smoking status: Never Smoker  . Smokeless tobacco: Never Used  Vaping Use  . Vaping Use: Never used  Substance and Sexual Activity  . Alcohol use: No    Alcohol/week: 0.0 standard drinks  . Drug use: No  . Sexual activity: Yes  Other Topics Concern  . Not on file  Social History Narrative  . Not on file   Social Determinants of Health   Financial Resource Strain: Not on file  Food Insecurity: Not on file  Transportation Needs: Not on file  Physical Activity: Not on file  Stress: Not on file  Social Connections: Not on file   Lives in an apartment. Smoking: denies Occupation: Doctor, hospital History: Water Damage/mildew in the house: no Carpet in the family room: yes Carpet in the bedroom: yes Heating: electric Cooling: central Pet: no  Family History: Family History  Problem Relation Age of Onset  . Hypertension Mother   . Diabetes Mother   . Stroke Father   . Allergic rhinitis Neg Hx   . Angioedema Neg Hx   . Asthma Neg Hx   . Atopy Neg Hx   . Eczema Neg Hx   . Immunodeficiency Neg Hx   . Urticaria Neg Hx    Review of Systems  Constitutional: Negative for appetite change, chills, fever and unexpected weight change.  HENT: Positive for congestion, postnasal drip, rhinorrhea and sneezing.   Eyes: Positive for itching.  Respiratory: Positive for cough. Negative for chest tightness, shortness of breath and wheezing.   Cardiovascular: Negative for chest pain.  Gastrointestinal: Negative for abdominal pain.  Genitourinary: Negative for difficulty urinating.  Skin: Negative for rash.  Neurological: Positive for headaches.   Objective: BP 110/80   Pulse 74   Temp 99 F (37.2 C) (Temporal)   Resp 16   Ht 5\' 7"  (1.702 m)   Wt 274 lb (124.3 kg)   SpO2 99%   BMI 42.91 kg/m  Body mass index is 42.[place]  kg/m. Physical  Exam Vitals and nursing note reviewed.  Constitutional:      Appearance: Normal appearance. She is well-developed. She is obese.  HENT:     Head: Normocephalic and atraumatic.     Right Ear: Tympanic membrane and external ear normal.     Left Ear: Tympanic membrane and external ear normal.     Nose: Nose normal.     Mouth/Throat:     Mouth: Mucous membranes are moist.     Pharynx: Oropharynx is clear.  Eyes:     Conjunctiva/sclera: Conjunctivae normal.  Cardiovascular:     Rate and Rhythm: Normal rate and regular rhythm.     Heart sounds: Normal heart sounds. No murmur heard. No friction rub. No gallop.   Pulmonary:     Effort: Pulmonary effort is normal.     Breath sounds: Normal breath sounds. No wheezing, rhonchi or rales.  Musculoskeletal:     Cervical back: Neck supple.  Skin:    General: Skin is warm.     Findings: No rash.  Neurological:     Mental Status: She is alert and oriented to person, place, and time.  Psychiatric:        Behavior: Behavior normal.    The plan was reviewed with the patient/family, and all questions/concerned were addressed.  It was my pleasure to see Naimah today and participate in her care. Please feel free to contact me with any questions or concerns.  Sincerely,  Rexene Alberts, DO Allergy & Immunology  [facility] office: [place] office: [phone]

## 2020-10-13 ENCOUNTER — Ambulatory Visit: Payer: No Typology Code available for payment source | Admitting: Allergy

## 2020-10-13 ENCOUNTER — Other Ambulatory Visit: Payer: Self-pay

## 2020-10-13 ENCOUNTER — Encounter: Payer: Self-pay | Admitting: Allergy

## 2020-10-13 VITALS — BP 110/80 | HR 74 | Temp 99.0°F | Resp 16 | Ht 67.0 in | Wt 274.0 lb

## 2020-10-13 DIAGNOSIS — T50905A Adverse effect of unspecified drugs, medicaments and biological substances, initial encounter: Secondary | ICD-10-CM | POA: Insufficient documentation

## 2020-10-13 DIAGNOSIS — Z130 Encounter for screening for diseases of the blood and blood-forming organs and certain disorders involving the immune mechanism: Secondary | ICD-10-CM | POA: Diagnosis not present

## 2020-10-13 DIAGNOSIS — J40 Bronchitis, not specified as acute or chronic: Secondary | ICD-10-CM

## 2020-10-13 DIAGNOSIS — J45909 Unspecified asthma, uncomplicated: Secondary | ICD-10-CM | POA: Diagnosis not present

## 2020-10-13 DIAGNOSIS — J310 Chronic rhinitis: Secondary | ICD-10-CM

## 2020-10-13 DIAGNOSIS — T50905D Adverse effect of unspecified drugs, medicaments and biological substances, subsequent encounter: Secondary | ICD-10-CM | POA: Diagnosis not present

## 2020-10-13 DIAGNOSIS — J189 Pneumonia, unspecified organism: Secondary | ICD-10-CM

## 2020-10-13 DIAGNOSIS — R21 Rash and other nonspecific skin eruption: Secondary | ICD-10-CM

## 2020-10-13 MED ORDER — BREO ELLIPTA 100-25 MCG/INH IN AEPB: 1.0000 | INHALATION_SPRAY | Freq: Every day | RESPIRATORY_TRACT | 3 refills | Status: DC

## 2020-10-13 NOTE — Assessment & Plan Note (Signed)
Hives in 2013 after taking amoxicillin.  Consider penicillin skin testing/drug challenge in the future.  More than 90% of patients outgrow their penicillin allergy.

## 2020-10-13 NOTE — Assessment & Plan Note (Signed)
Perennial rhino conjunctivitis symptoms for 6 years with worsening in the spring. Tried Flonase, Singulair and OTC antihistamines with minimal benefit. No prior allergy/ENT evaluation.   Today's skin testing showed: Negative to indoor/outdoor allergens but the positive control was negative questioning the validity of the results.   Patient denies taking any antihistamines recently/  Will double check via bloodwork.  May use over the counter antihistamines such as Zyrtec (cetirizine), Claritin (loratadine), Allegra (fexofenadine), or Xyzal (levocetirizine) daily as needed.  Continue Singulair (montelukast) 10mg  daily at night.  May use Flonase (fluticasone) nasal spray 1 spray per nostril twice a day as needed for nasal congestion.   Nasal saline spray (i.e., Simply Saline) or nasal saline lavage (i.e., NeilMed) is recommended as needed and prior to medicated nasal sprays.

## 2020-10-13 NOTE — Assessment & Plan Note (Addendum)
Persistent coughing for 1 month after bronchitis. Usually gets pneumonia/bronchitis once per year since 2013. No prior asthma/copd diagnosis. Using albuterol prn with some benefit.   Today's spirometry showed: possible restrictive disease with no improvement in FEV1 post bronchodilator treatment. Clinically feeling improved.   Patient seems to have some URI induced reactive airway disease.  . Daily controller medication(s): START Breo 173mcg 1 puff daily and rinse mouth after each use. Demonstrated proper use.  . Continue Singulair (montelukast) 10mg  daily at night. . May use albuterol rescue inhaler 2 puffs every 4 to 6 hours as needed for shortness of breath, chest tightness, coughing, and wheezing. May use albuterol rescue inhaler 2 puffs 5 to 15 minutes prior to strenuous physical activities. Monitor frequency of use.   Repeat spirometry at next visit.

## 2020-10-13 NOTE — Assessment & Plan Note (Signed)
   See below for proper skin care.  Follow up with dermatologist and their recommendations.

## 2020-10-13 NOTE — Patient Instructions (Addendum)
Today's skin testing showed: Negative to indoor/outdoor allergens but the positive control was negative questioning the validity of the results. Will double check via bloodwork.  Breathing: . Daily controller medication(s): START Breo 141mcg 1 puff daily and rinse mouth after each use. Demonstrated proper use.  . Continue Singulair (montelukast) 10mg  daily at night. . May use albuterol rescue inhaler 2 puffs every 4 to 6 hours as needed for shortness of breath, chest tightness, coughing, and wheezing. May use albuterol rescue inhaler 2 puffs 5 to 15 minutes prior to strenuous physical activities. Monitor frequency of use.  . Asthma control goals:  o Full participation in all desired activities (may need albuterol before activity) o Albuterol use two times or less a week on average (not counting use with activity) o Cough interfering with sleep two times or less a month o Oral steroids no more than once a year o No hospitalizations  Rhinitis:   Get bloodwork  May use over the counter antihistamines such as Zyrtec (cetirizine), Claritin (loratadine), Allegra (fexofenadine), or Xyzal (levocetirizine) daily as needed.  Continue Singulair (montelukast) 10mg  daily at night.  May use Flonase (fluticasone) nasal spray 1 spray per nostril twice a day as needed for nasal congestion.   Nasal saline spray (i.e., Simply Saline) or nasal saline lavage (i.e., NeilMed) is recommended as needed and prior to medicated nasal sprays.  Infections:   Keep track of infections and antibiotics use. Get bloodwork:  We are ordering labs, so please allow 1-2 weeks for the results to come back. With the newly implemented Cures Act, the labs might be visible to you at the same time that they become visible to me. However, I will not address the results until all of the results are back, so please be patient.   Recommend annual flu vaccine.   Skin:  See below for proper skin care.  Follow up with  dermatologist and their recommendations.   Drug allergy:  Consider penicillin skin testing/drug challenge in the future.  More than 90% of patients outgrow their penicillin allergy.   Recommend getting the COVID-19 vaccine.  Here's more information: [email or web address]  Follow up in 2 months or sooner if needed in our [place] office.

## 2020-10-13 NOTE — Assessment & Plan Note (Signed)
Frequent bronchitis/pneumonia even requiring hospitalization in 2013. Usually takes 2 courses of antibiotics per year. No prior immune work up.  Keep track of infections and antibiotics use. Get bloodwork to look at immune system.  Recommend annual flu vaccine.   Recommend COVID-19 vaccinations.

## 2020-10-22 LAB — ALLERGENS W/TOTAL IGE AREA 2

## 2020-10-22 LAB — STREP PNEUMONIAE 23 SEROTYPES IGG
Pneumo Ab Type 1*: 7.3 ug/mL (ref >1.3)
Pneumo Ab Type 12 (12F)*: 0.3 ug/mL — ABNORMAL LOW (ref >1.3)
Pneumo Ab Type 14*: 0.5 ug/mL — ABNORMAL LOW (ref >1.3)
Pneumo Ab Type 17 (17F)*: 1.4 ug/mL (ref >1.3)
Pneumo Ab Type 19 (19F)*: 1.6 ug/mL (ref >1.3)
Pneumo Ab Type 2*: 16.5 ug/mL (ref >1.3)
Pneumo Ab Type 20*: 2.1 ug/mL (ref >1.3)
Pneumo Ab Type 22 (22F)*: 0.7 ug/mL — ABNORMAL LOW (ref >1.3)
Pneumo Ab Type 23 (23F)*: 0.1 ug/mL — ABNORMAL LOW (ref >1.3)
Pneumo Ab Type 26 (6B)*: 0.5 ug/mL — ABNORMAL LOW (ref >1.3)
Pneumo Ab Type 3*: 5.4 ug/mL (ref >1.3)
Pneumo Ab Type 34 (10A)*: 4.7 ug/mL (ref >1.3)
Pneumo Ab Type 4*: 0.3 ug/mL — ABNORMAL LOW (ref >1.3)
Pneumo Ab Type 43 (11A)*: 0.8 ug/mL — ABNORMAL LOW (ref >1.3)
Pneumo Ab Type 5*: 2.9 ug/mL (ref >1.3)
Pneumo Ab Type 51 (7F)*: 6.5 ug/mL (ref >1.3)
Pneumo Ab Type 54 (15B)*: 0.6 ug/mL — ABNORMAL LOW (ref >1.3)
Pneumo Ab Type 56 (18C)*: 0.4 ug/mL — ABNORMAL LOW (ref >1.3)
Pneumo Ab Type 57 (19A)*: 3 ug/mL (ref >1.3)
Pneumo Ab Type 68 (9V)*: 4.9 ug/mL (ref >1.3)
Pneumo Ab Type 70 (33F)*: 4.9 ug/mL (ref >1.3)
Pneumo Ab Type 8*: 2.1 ug/mL (ref >1.3)
Pneumo Ab Type 9 (9N)*: 1.4 ug/mL (ref >1.3)

## 2020-10-22 LAB — CBC WITH DIFFERENTIAL/PLATELET
Basophils Absolute: 0 10*3/uL (ref 0.0–0.2)
Basos: 1 %
EOS (ABSOLUTE): 0.1 10*3/uL (ref 0.0–0.4)
Eos: 1 %
Hematocrit: 34.6 % (ref 34.0–46.6)
Hemoglobin: 11.6 g/dL (ref 11.1–15.9)
Immature Grans (Abs): 0 10*3/uL (ref 0.0–0.1)
Immature Granulocytes: 0 %
Lymphocytes Absolute: 2.1 10*3/uL (ref 0.7–3.1)
Lymphs: 27 %
MCH: 30.2 pg (ref 26.6–33.0)
MCHC: 33.5 g/dL (ref 31.5–35.7)
MCV: 90 fL (ref 79–97)
Monocytes Absolute: 0.5 10*3/uL (ref 0.1–0.9)
Monocytes: 6 %
Neutrophils Absolute: 5 10*3/uL (ref 1.4–7.0)
Neutrophils: 65 %
Platelets: 234 10*3/uL (ref 150–450)
RBC: 3.84 x10E6/uL (ref 3.77–5.28)
RDW: 13.5 % (ref 11.7–15.4)
WBC: 7.8 10*3/uL (ref 3.4–10.8)

## 2020-10-22 LAB — IGG, IGA, IGM
IgA/Immunoglobulin A, Serum: 261 mg/dL (ref 87–352)
IgG (Immunoglobin G), Serum: 1249 mg/dL (ref 586–1602)
IgM (Immunoglobulin M), Srm: 81 mg/dL (ref 26–217)

## 2020-10-22 LAB — DIPHTHERIA / TETANUS ANTIBODY PANEL
Diphtheria Ab: <0.1 IU/mL — ABNORMAL LOW (ref <0.10)
Tetanus Ab, IgG: 0.45 IU/mL (ref <0.10)

## 2020-10-22 LAB — COMPLEMENT, TOTAL: Compl, Total (CH50): >60 U/mL (ref >41)

## 2020-12-15 ENCOUNTER — Ambulatory Visit: Payer: No Typology Code available for payment source | Admitting: Family Medicine

## 2020-12-23 ENCOUNTER — Inpatient Hospital Stay (HOSPITAL_COMMUNITY)
Admission: EM | Admit: 2020-12-23 | Discharge: 2020-12-23 | Disposition: A | Discharge status: Home / Self Care | Payer: Medicaid Other | Attending: Obstetrics and Gynecology | Admitting: Obstetrics and Gynecology

## 2020-12-23 ENCOUNTER — Inpatient Hospital Stay (HOSPITAL_COMMUNITY): Payer: Medicaid Other

## 2020-12-23 ENCOUNTER — Encounter (HOSPITAL_COMMUNITY): Payer: Self-pay | Admitting: Obstetrics and Gynecology

## 2020-12-23 ENCOUNTER — Other Ambulatory Visit: Payer: Self-pay

## 2020-12-23 DIAGNOSIS — R102 Pelvic and perineal pain: Secondary | ICD-10-CM

## 2020-12-23 DIAGNOSIS — Z6791 Unspecified blood type, Rh negative: Secondary | ICD-10-CM | POA: Diagnosis not present

## 2020-12-23 DIAGNOSIS — R109 Unspecified abdominal pain: Secondary | ICD-10-CM | POA: Diagnosis not present

## 2020-12-23 DIAGNOSIS — Z3A01 Less than 8 weeks gestation of pregnancy: Secondary | ICD-10-CM | POA: Diagnosis not present

## 2020-12-23 DIAGNOSIS — O3680X0 Pregnancy with inconclusive fetal viability, not applicable or unspecified: Secondary | ICD-10-CM | POA: Diagnosis not present

## 2020-12-23 DIAGNOSIS — O26899 Other specified pregnancy related conditions, unspecified trimester: Secondary | ICD-10-CM

## 2020-12-23 DIAGNOSIS — N939 Abnormal uterine and vaginal bleeding, unspecified: Secondary | ICD-10-CM

## 2020-12-23 DIAGNOSIS — O26851 Spotting complicating pregnancy, first trimester: Secondary | ICD-10-CM | POA: Diagnosis not present

## 2020-12-23 DIAGNOSIS — O26891 Other specified pregnancy related conditions, first trimester: Secondary | ICD-10-CM | POA: Insufficient documentation

## 2020-12-23 DIAGNOSIS — O360110 Maternal care for anti-D [Rh] antibodies, first trimester, not applicable or unspecified: Secondary | ICD-10-CM

## 2020-12-23 LAB — COMPREHENSIVE METABOLIC PANEL
ALT: 21 U/L (ref 0–44)
AST: 18 U/L (ref 15–41)
Albumin: 3.3 g/dL — ABNORMAL LOW (ref 3.5–5.0)
Alkaline Phosphatase: 41 U/L (ref 38–126)
Anion gap: 10 (ref 5–15)
BUN: 7 mg/dL (ref 6–20)
CO2: 23 mmol/L (ref 22–32)
Calcium: 8.6 mg/dL — ABNORMAL LOW (ref 8.9–10.3)
Chloride: 98 mmol/L (ref 98–111)
Creatinine, Ser: 0.91 mg/dL (ref 0.44–1.00)
GFR, Estimated: >60 mL/min (ref >60)
Glucose, Bld: 88 mg/dL (ref 70–99)
Potassium: 3.3 mmol/L — ABNORMAL LOW (ref 3.5–5.1)
Sodium: 131 mmol/L — ABNORMAL LOW (ref 135–145)
Total Bilirubin: 0.5 mg/dL (ref 0.3–1.2)
Total Protein: 6.9 g/dL (ref 6.5–8.1)

## 2020-12-23 LAB — CBC WITH DIFFERENTIAL/PLATELET
Abs Immature Granulocytes: 0.11 10*3/uL — ABNORMAL HIGH (ref 0.00–0.07)
Basophils Absolute: 0 10*3/uL (ref 0.0–0.1)
Basophils Relative: 0 %
Eosinophils Absolute: 0 10*3/uL (ref 0.0–0.5)
Eosinophils Relative: 0 %
HCT: 33.4 % — ABNORMAL LOW (ref 36.0–46.0)
Hemoglobin: 10.7 g/dL — ABNORMAL LOW (ref 12.0–15.0)
Immature Granulocytes: 1 %
Lymphocytes Relative: 21 %
Lymphs Abs: 2.2 10*3/uL (ref 0.7–4.0)
MCH: 30.2 pg (ref 26.0–34.0)
MCHC: 32 g/dL (ref 30.0–36.0)
MCV: 94.4 fL (ref 80.0–100.0)
Monocytes Absolute: 0.8 10*3/uL (ref 0.1–1.0)
Monocytes Relative: 8 %
Neutro Abs: 7.2 10*3/uL (ref 1.7–7.7)
Neutrophils Relative %: 70 %
Platelets: 289 10*3/uL (ref 150–400)
RBC: 3.54 MIL/uL — ABNORMAL LOW (ref 3.87–5.11)
RDW: 13.9 % (ref 11.5–15.5)
WBC: 10.4 10*3/uL (ref 4.0–10.5)
nRBC: 0.2 % (ref 0.0–0.2)

## 2020-12-23 LAB — URINALYSIS, ROUTINE W REFLEX MICROSCOPIC
Bilirubin Urine: NEGATIVE
Glucose, UA: NEGATIVE mg/dL
Hgb urine dipstick: NEGATIVE
Ketones, ur: NEGATIVE mg/dL
Leukocytes,Ua: NEGATIVE
Nitrite: NEGATIVE
Protein, ur: NEGATIVE mg/dL
Specific Gravity, Urine: 1.019 (ref 1.005–1.030)
pH: 5 (ref 5.0–8.0)

## 2020-12-23 LAB — I-STAT BETA HCG BLOOD, ED (MC, WL, AP ONLY): I-stat hCG, quantitative: >2000 m[IU]/mL — ABNORMAL HIGH (ref <5)

## 2020-12-23 LAB — HCG, QUANTITATIVE, PREGNANCY: hCG, Beta Chain, Quant, S: 10790 m[IU]/mL — ABNORMAL HIGH (ref <5)

## 2020-12-23 LAB — ABO/RH: ABO/RH(D): A NEG

## 2020-12-23 MED ORDER — RHO D IMMUNE GLOBULIN 1500 UNIT/2ML IJ SOSY
300.0000 ug | PREFILLED_SYRINGE | Freq: Once | INTRAMUSCULAR | Status: AC
  Administered 2020-12-23: 300 ug via INTRAMUSCULAR
  Filled 2020-12-23: qty 2

## 2020-12-23 NOTE — MAU Provider Note (Signed)
History     CSN: [id number]  Arrival date and time: 12/23/20 1638   None     Chief Complaint  Patient presents with   Vaginal Bleeding   Abdominal Pain   [name] is a 40 y.o. G1P0 at [redacted]w[redacted]d who presents to MAU for vaginal bleeding which began last night. Patient reports last night she had 3 episodes of bleeding while wiping after urination, but has not had any bleeding today. Patient also reports mild pelvic cramping that is intermittent and not present at this time. Patient called her OB office and was told to come to MAU for evaluation for a UTI. Patient is established with [facility] for care.  Pt denies vaginal discharge/odor/itching. Pt denies N/V, abdominal pain, constipation, diarrhea, or urinary problems. Pt denies fever, chills, fatigue, sweating or changes in appetite. Pt denies SOB or chest pain. Pt denies dizziness, HA, light-headedness, weakness.   OB History     Gravida  1   Para      Term      Preterm      AB      Living         SAB      IAB      Ectopic      Multiple      Live Births              Past Medical History:  Diagnosis Date   Anemia    Arthritis    Asthma    Community acquired pneumonia of left lung 12/26/2016   improving   Cough 12/26/2016   DVT (deep venous thrombosis) (HCC)    Eczema    GERD (gastroesophageal reflux disease)    Heart murmur    echo done 2014-[facility]- neg   Hypertension    Miscarriage    Miscarriage 2013   had fibroids- lost blood- 3 tranfusions- developed dvt, pe 2014   Peripheral vascular disease ([place]) 2014   pe, dvt  left leg   rx           Pneumonia    Pulmonary embolism ([place])     Past Surgical History:  Procedure Laterality Date   CHOLECYSTECTOMY N/A 04/05/2016   Procedure: LAPAROSCOPIC CHOLECYSTECTOMY WITH INTRAOPERATIVE CHOLANGIOGRAM;  Surgeon: Autumn Messing III, MD;  Location: [facility];  Service: General;  Laterality: N/A;   IVC FILTER PLACEMENT ([place] HX)  2014   removed 5  2014   MYOMECTOMY      Family History  Problem Relation Age of Onset   Hypertension Mother    Diabetes Mother    Stroke Father    Allergic rhinitis Neg Hx    Angioedema Neg Hx    Asthma Neg Hx    Atopy Neg Hx    Eczema Neg Hx    Immunodeficiency Neg Hx    Urticaria Neg Hx     Social History   Tobacco Use   Smoking status: Never   Smokeless tobacco: Never  Vaping Use   Vaping Use: Never used  Substance Use Topics   Alcohol use: No    Alcohol/week: 0.0 standard drinks   Drug use: No    Allergies:  Allergies  Allergen Reactions   Amoxicillin Anaphylaxis   Penicillins Anaphylaxis    Medications Prior to Admission  Medication Sig Dispense Refill Last Dose   albuterol (PROAIR HFA) 108 (90 Base) MCG/ACT inhaler Inhale into the lungs every 6 (six) hours as needed for wheezing or shortness of  breath.      amLODipine (NORVASC) 10 MG tablet TAKE 1 TABLET BY MOUTH EVERY DAY 15 tablet 0    cholestyramine (QUESTRAN) 4 g packet Take 1 packet by mouth daily.      fluticasone furoate-vilanterol (BREO ELLIPTA) 100-25 MCG/INH AEPB Inhale 1 puff into the lungs daily. Rinse mouth after each use 60 each 3    hydrOXYzine (ATARAX/VISTARIL) 50 MG tablet Take 50 mg by mouth daily.      montelukast (SINGULAIR) 10 MG tablet Take 10 mg by mouth at bedtime.      olmesartan (BENICAR) 20 MG tablet Take 1 tablet by mouth daily.      omeprazole (PRILOSEC) 20 MG capsule Take 1 capsule (20 mg total) daily by mouth. 90 capsule 3    sertraline (ZOLOFT) 25 MG tablet Take 25 mg by mouth daily.      valACYclovir (VALTREX) 500 MG tablet   2     Review of Systems  Constitutional:  Negative for chills, diaphoresis, fatigue and fever.  Eyes:  Negative for visual disturbance.  Respiratory:  Negative for shortness of breath.   Cardiovascular:  Negative for chest pain.  Gastrointestinal:  Negative for abdominal pain, constipation, diarrhea, nausea and vomiting.  Genitourinary:  Positive for pelvic pain and  vaginal bleeding. Negative for dysuria, flank pain, frequency, urgency and vaginal discharge.  Neurological:  Negative for dizziness, weakness, light-headedness and headaches.   Physical Exam   Blood pressure 120/74, pulse 89, temperature 98.5 F (36.9 C), temperature source Oral, resp. rate 19, height 5\' 7"  (1.702 m), weight 121.5 kg, last menstrual period 11/10/2020, SpO2 99 %.  Patient Vitals for the past 24 hrs:  BP Temp Temp src Pulse Resp SpO2 Height Weight  12/23/20 1858 120/74 98.5 F (36.9 C) Oral 89 19 99 % -- --  12/23/20 1852 -- -- -- -- -- -- 5\' 7"  (1.702 m) 121.5 kg  12/23/20 1647 114/74 98.1 F (36.7 C) Oral 95 18 98 % -- --   Physical Exam Constitutional:      General: She is not in acute distress.    Appearance: She is well-developed. She is not diaphoretic.  HENT:     Head: Normocephalic and atraumatic.  Pulmonary:     Effort: Pulmonary effort is normal.  Abdominal:     General: There is no distension.     Palpations: Abdomen is soft. There is no mass.     Tenderness: There is no abdominal tenderness. There is no guarding or rebound.  Skin:    General: Skin is warm and dry.  Neurological:     Mental Status: She is alert and oriented to person, place, and time.  Psychiatric:        Behavior: Behavior normal.        Thought Content: Thought content normal.        Judgment: Judgment normal.   Results for orders placed or performed during the hospital encounter of 12/23/20 (from the past 24 hour(s))  I-Stat Beta hCG blood, ED ([facility], [facility], [facility] only)     Status: Abnormal   Collection Time: 12/23/20  5:59 PM  Result Value Ref Range   I-stat hCG, quantitative >2,000.0 (H) <5 mIU/mL   Comment 3          Urinalysis, Routine w reflex microscopic Urine, Clean Catch     Status: Abnormal   Collection Time: 12/23/20  7:09 PM  Result Value Ref Range   Color, Urine YELLOW YELLOW   APPearance HAZY (A)  CLEAR   Specific Gravity, Urine 1.019 1.005 - 1.030   pH 5.0 5.0 - 8.0    Glucose, UA NEGATIVE NEGATIVE mg/dL   Hgb urine dipstick NEGATIVE NEGATIVE   Bilirubin Urine NEGATIVE NEGATIVE   Ketones, ur NEGATIVE NEGATIVE mg/dL   Protein, ur NEGATIVE NEGATIVE mg/dL   Nitrite NEGATIVE NEGATIVE   Leukocytes,Ua NEGATIVE NEGATIVE  CBC with Differential/Platelet     Status: Abnormal   Collection Time: 12/23/20  7:17 PM  Result Value Ref Range   WBC 10.4 4.0 - 10.5 K/uL   RBC 3.54 (L) 3.87 - 5.11 MIL/uL   Hemoglobin 10.7 (L) 12.0 - 15.0 g/dL   HCT 33.4 (L) 36.0 - 46.0 %   MCV 94.4 80.0 - 100.0 fL   MCH 30.2 26.0 - 34.0 pg   MCHC 32.0 30.0 - 36.0 g/dL   RDW 13.9 11.5 - 15.5 %   Platelets 289 150 - 400 K/uL   nRBC 0.2 0.0 - 0.2 %   Neutrophils Relative % 70 %   Neutro Abs 7.2 1.7 - 7.7 K/uL   Lymphocytes Relative 21 %   Lymphs Abs 2.2 0.7 - 4.0 K/uL   Monocytes Relative 8 %   Monocytes Absolute 0.8 0.1 - 1.0 K/uL   Eosinophils Relative 0 %   Eosinophils Absolute 0.0 0.0 - 0.5 K/uL   Basophils Relative 0 %   Basophils Absolute 0.0 0.0 - 0.1 K/uL   Immature Granulocytes 1 %   Abs Immature Granulocytes 0.11 (H) 0.00 - 0.07 K/uL  Comprehensive metabolic panel     Status: Abnormal   Collection Time: 12/23/20  7:17 PM  Result Value Ref Range   Sodium 131 (L) 135 - 145 mmol/L   Potassium 3.3 (L) 3.5 - 5.1 mmol/L   Chloride 98 98 - 111 mmol/L   CO2 23 22 - 32 mmol/L   Glucose, Bld 88 70 - 99 mg/dL   BUN 7 6 - 20 mg/dL   Creatinine, Ser 0.91 0.44 - 1.00 mg/dL   Calcium 8.6 (L) 8.9 - 10.3 mg/dL   Total Protein 6.9 6.5 - 8.1 g/dL   Albumin 3.3 (L) 3.5 - 5.0 g/dL   AST 18 15 - 41 U/L   ALT 21 0 - 44 U/L   Alkaline Phosphatase 41 38 - 126 U/L   Total Bilirubin 0.5 0.3 - 1.2 mg/dL   GFR, Estimated >60 >60 mL/min   Anion gap 10 5 - 15  hCG, quantitative, pregnancy     Status: Abnormal   Collection Time: 12/23/20  7:17 PM  Result Value Ref Range   hCG, Beta Chain, Quant, S 10,790 (H) <5 mIU/mL  ABO/Rh     Status: None   Collection Time: 12/23/20  7:17 PM   Result Value Ref Range   ABO/RH(D)      A NEG Performed at [facility], [address] [place], [place] [address]   Rh IG workup (includes ABO/Rh)     Status: None (Preliminary result)   Collection Time: 12/23/20  7:17 PM  Result Value Ref Range   Gestational Age(Wks) 6    Antibody Screen      NEG Performed at [facility] [address] [place], [place] [address]    Unit Number [id number]    Blood Component Type RHIG    Unit division 00    Status of Unit ALLOCATED    Transfusion Status OK TO TRANSFUSE    US OB LESS THAN 14 WEEKS WITH  OB TRANSVAGINAL  Result Date: 12/23/2020 CLINICAL DATA:  Spotting, lower abdominal pain EXAM: OBSTETRIC <14 WK [place] AND TRANSVAGINAL OB US TECHNIQUE: Both transabdominal and transvaginal ultrasound examinations were performed for complete evaluation of the gestation as well as the maternal uterus, adnexal regions, and pelvic cul-de-sac. Transvaginal technique was performed to assess early pregnancy. COMPARISON:  None. FINDINGS: Intrauterine gestational sac: Single Yolk sac:  Not Visualized. Embryo:  Not Visualized. Cardiac Activity: Not Visualized. Heart Rate:   bpm MSD: 3.3 mm   5 w   0 d CRL:    mm    w    d                  [place] EDC: Subchorionic hemorrhage:  None visualized. Maternal uterus/adnexae: No adnexal mass or free fluid IMPRESSION: Probable early intrauterine gestational sac, but no yolk sac, fetal pole, or cardiac activity yet visualized. Recommend follow-up quantitative B-HCG levels and follow-up US in 14 days to assess viability. This recommendation follows SRU consensus guidelines: Diagnostic Criteria for Nonviable Pregnancy Early in the First Trimester. [place] Med 2013; 540:9811-91. No acute maternal findings. Electronically Signed   By: Rolm Baptise M.D.   On: 12/23/2020 20:44    MAU Course  Procedures  MDM -r/o ectopic -UA: hazy, otherwise WNL, sending urine for culture based on symptoms -CBC: no abnormalities  requiring treatment -CMP: K 3.3 -[place]: Probable early intrauterine gestational sac, but no yolk sac, fetal pole, or cardiac activity yet visualized. Recommend follow-up quantitative B-HCG levels and follow-up US in 14 days to assess viability. This recommendation follows SRU consensus guidelines -hCG: 10,790 -ABO: A NEGATIVE -RhoGAM ordered -WetPrep: pending at time of discharge -GC/CT collected -consulted with Dr. Nelda Marseille, will treat as SAB, pt to follow-up in one week in office for hCG and repeat US -pt discharged to home in stable condition  Orders Placed This Encounter  Procedures   Wet prep, genital    Standing Status:   Standing    Number of Occurrences:   1   US OB LESS THAN 14 WEEKS WITH OB TRANSVAGINAL    Standing Status:   Standing    Number of Occurrences:   1    Order Specific Question:   Symptom/Reason for Exam    Answer:   Vaginal spotting [[id number]]   Urinalysis, Routine w reflex microscopic Urine, Clean Catch    Standing Status:   Standing    Number of Occurrences:   1   CBC with Differential/Platelet    Standing Status:   Standing    Number of Occurrences:   1   Comprehensive metabolic panel    Standing Status:   Standing    Number of Occurrences:   1   hCG, quantitative, pregnancy    Standing Status:   Standing    Number of Occurrences:   1   I-Stat Beta hCG blood, ED ([facility], [facility], [facility] only)    Standing Status:   Standing    Number of Occurrences:   1   ABO/Rh    Standing Status:   Standing    Number of Occurrences:   1   Rh IG workup (includes ABO/Rh)    Standing Status:   Standing    Number of Occurrences:   1    Order Specific Question:   Weeks of Gestation    Answer:   6    Order Specific Question:   RhIG indication:    Answer:   Routine Prenatal or 1st/2nd  Trimester Bleeding   Meds ordered this encounter  Medications   rho (d) immune globulin (RHIG/RHOPHYLAC) injection 300 mcg   Assessment and Plan   1. Pregnancy with uncertain fetal viability, single or  unspecified fetus   2. Vaginal spotting   3. Rh negative state in antepartum period   4. Pelvic cramping     Allergies as of 12/23/2020       Reactions   Amoxicillin Anaphylaxis   Penicillins Anaphylaxis        Medication List     TAKE these medications    amLODipine 10 MG tablet Commonly known as: NORVASC TAKE 1 TABLET BY MOUTH EVERY DAY   Breo Ellipta 100-25 MCG/INH Aepb Generic drug: fluticasone furoate-vilanterol Inhale 1 puff into the lungs daily. Rinse mouth after each use   cholestyramine 4 g packet Commonly known as: QUESTRAN Take 1 packet by mouth daily.   hydrOXYzine 50 MG tablet Commonly known as: ATARAX/VISTARIL Take 50 mg by mouth daily.   montelukast 10 MG tablet Commonly known as: SINGULAIR Take 10 mg by mouth at bedtime.   olmesartan 20 MG tablet Commonly known as: BENICAR Take 1 tablet by mouth daily.   omeprazole 20 MG capsule Commonly known as: PRILOSEC Take 1 capsule (20 mg total) daily by mouth.   ProAir HFA 108 (90 Base) MCG/ACT inhaler Generic drug: albuterol Inhale into the lungs every 6 (six) hours as needed for wheezing or shortness of breath.   sertraline 25 MG tablet Commonly known as: ZOLOFT Take 25 mg by mouth daily.   valACYclovir 500 MG tablet Commonly known as: VALTREX       -will call with culture results, if positive -report given to J. [place], CNM who will help pt get scheduled in one week for hCG/US -return MAU precautions given -pt discharged to home in stable condition  Gerrie Nordmann [place] Difranco 12/23/2020, 9:39 PM

## 2020-12-23 NOTE — MAU Note (Signed)
Presents with c/o spotting with wiping and lower abdominal pain that began last night.  Also reports having pressure when urinating, denies dysuria or burning.  LMP 11/10/2020.

## 2020-12-23 NOTE — ED Provider Notes (Signed)
Emergency Medicine Provider OB Triage Evaluation Note  Sheri Robinson is a 40 y.o. female, No obstetric history on file., at Unknown gestation who presents to the emergency department with complaints of vaginal bleeding and pelvic pain.  Reports that last night she had vaginal bleeding described as spotting.  This morning she woke up with pelvic pressure patient has had no further episodes of vaginal bleeding.  Patient reports that she was [redacted] weeks pregnant.  LMP 5/19.  Patient seems Dr. Charlesetta Garibaldi with [facility].  G2P0010.  Previous pregnancy resulted in a miscarriage at 96months gestation.    Review of  Systems  Positive: vaginal bleeding, pelvic pain, urinary symptoms Negative: Dysuria, hematuria, vaginal pain, vaginal discharge, genital sores or lesions  Physical Exam  BP 114/74 (BP Location: Right Arm)   Pulse 95   Temp 98.1 F (36.7 C) (Oral)   Resp 18   SpO2 98%  General: Awake, no distress  HEENT: Atraumatic  Resp: Normal effort  Cardiac: Normal rate Abd: Nondistended, nontender  MSK: Moves all extremities without difficulty Neuro: Speech clear  Medical Decision Making  Pt evaluated for pregnancy concern and is stable for transfer to MAU. Pt is in agreement with plan for transfer.  6:25 PM Discussed with MAU APP, Threasa Beards, who accepts patient in transfer.  Clinical Impression  No diagnosis found.     Loni Beckwith, PA-C 12/23/20 [place], [place], DO 12/23/20 1904

## 2020-12-23 NOTE — Discharge Instructions (Signed)

## 2020-12-24 DIAGNOSIS — U071 COVID-19: Secondary | ICD-10-CM

## 2020-12-24 HISTORY — DX: COVID-19: U07.1

## 2020-12-24 LAB — WET PREP, GENITAL
Clue Cells Wet Prep HPF POC: NONE SEEN
Sperm: NONE SEEN
Trich, Wet Prep: NONE SEEN
Yeast Wet Prep HPF POC: NONE SEEN

## 2020-12-24 LAB — RH IG WORKUP (INCLUDES ABO/RH)
Antibody Screen: NEGATIVE
Gestational Age(Wks): 6
Unit division: 0

## 2020-12-24 LAB — GC/CHLAMYDIA PROBE AMP (~~LOC~~) NOT AT ARMC
Chlamydia: NEGATIVE
Comment: NEGATIVE
Comment: NORMAL
Neisseria Gonorrhea: NEGATIVE

## 2020-12-31 ENCOUNTER — Other Ambulatory Visit: Payer: Self-pay | Admitting: Obstetrics and Gynecology

## 2020-12-31 ENCOUNTER — Other Ambulatory Visit: Payer: Self-pay

## 2020-12-31 ENCOUNTER — Inpatient Hospital Stay (HOSPITAL_COMMUNITY)
Admission: AD | Admit: 2020-12-31 | Discharge: 2020-12-31 | Disposition: A | Discharge status: Home / Self Care | Payer: 59 | Attending: Obstetrics and Gynecology | Admitting: Obstetrics and Gynecology

## 2020-12-31 DIAGNOSIS — O26891 Other specified pregnancy related conditions, first trimester: Secondary | ICD-10-CM | POA: Diagnosis not present

## 2020-12-31 DIAGNOSIS — N920 Excessive and frequent menstruation with regular cycle: Secondary | ICD-10-CM | POA: Diagnosis not present

## 2020-12-31 DIAGNOSIS — Z3A01 Less than 8 weeks gestation of pregnancy: Secondary | ICD-10-CM | POA: Diagnosis not present

## 2020-12-31 DIAGNOSIS — O009 Unspecified ectopic pregnancy without intrauterine pregnancy: Secondary | ICD-10-CM | POA: Insufficient documentation

## 2020-12-31 LAB — COMPREHENSIVE METABOLIC PANEL
ALT: 13 U/L (ref 0–44)
AST: 14 U/L — ABNORMAL LOW (ref 15–41)
Albumin: 3.2 g/dL — ABNORMAL LOW (ref 3.5–5.0)
Alkaline Phosphatase: 40 U/L (ref 38–126)
Anion gap: 5 (ref 5–15)
BUN: 8 mg/dL (ref 6–20)
CO2: 26 mmol/L (ref 22–32)
Calcium: 8.8 mg/dL — ABNORMAL LOW (ref 8.9–10.3)
Chloride: 105 mmol/L (ref 98–111)
Creatinine, Ser: 0.86 mg/dL (ref 0.44–1.00)
GFR, Estimated: >60 mL/min (ref >60)
Glucose, Bld: 98 mg/dL (ref 70–99)
Potassium: 4.2 mmol/L (ref 3.5–5.1)
Sodium: 136 mmol/L (ref 135–145)
Total Bilirubin: 0.6 mg/dL (ref 0.3–1.2)
Total Protein: 7 g/dL (ref 6.5–8.1)

## 2020-12-31 LAB — CBC WITH DIFFERENTIAL/PLATELET
Abs Immature Granulocytes: 0.05 10*3/uL (ref 0.00–0.07)
Basophils Absolute: 0 10*3/uL (ref 0.0–0.1)
Basophils Relative: 0 %
Eosinophils Absolute: 0.1 10*3/uL (ref 0.0–0.5)
Eosinophils Relative: 1 %
HCT: 33.3 % — ABNORMAL LOW (ref 36.0–46.0)
Hemoglobin: 10.6 g/dL — ABNORMAL LOW (ref 12.0–15.0)
Immature Granulocytes: 1 %
Lymphocytes Relative: 18 %
Lymphs Abs: 1.6 10*3/uL (ref 0.7–4.0)
MCH: 30.2 pg (ref 26.0–34.0)
MCHC: 31.8 g/dL (ref 30.0–36.0)
MCV: 94.9 fL (ref 80.0–100.0)
Monocytes Absolute: 0.8 10*3/uL (ref 0.1–1.0)
Monocytes Relative: 8 %
Neutro Abs: 6.6 10*3/uL (ref 1.7–7.7)
Neutrophils Relative %: 72 %
Platelets: 246 10*3/uL (ref 150–400)
RBC: 3.51 MIL/uL — ABNORMAL LOW (ref 3.87–5.11)
RDW: 15 % (ref 11.5–15.5)
WBC: 9.1 10*3/uL (ref 4.0–10.5)
nRBC: 0.2 % (ref 0.0–0.2)

## 2020-12-31 LAB — HCG, QUANTITATIVE, PREGNANCY: hCG, Beta Chain, Quant, S: 35804 m[IU]/mL — ABNORMAL HIGH (ref <5)

## 2020-12-31 MED ORDER — METHOTREXATE FOR ECTOPIC PREGNANCY
50.0000 mg/m2 | Freq: Once | INTRAMUSCULAR | Status: AC
  Administered 2020-12-31: 120 mg via INTRAMUSCULAR
  Filled 2020-12-31: qty 4.8

## 2020-12-31 NOTE — MAU Note (Signed)
Spoke with Dr Charlesetta Garibaldi regarding patient arrival. Orders entered by provider for labs and MTX. States she will come see the patient in MAU.

## 2020-12-31 NOTE — MAU Note (Addendum)
Sheri Robinson is a 40 y.o. at [redacted]w[redacted]d here in MAU reporting: here for MTX for ectopic pregnancy. No bleeding but is having some cramping.   Onset of complaint: ongoing  Pain score: 5/10  Vitals:   12/31/20 1125  BP: 119/74  Pulse: 84  Resp: 16  Temp: 98.3 F (36.8 C)  SpO2: 97%     Lab orders placed from triage: entered by Dr Charlesetta Garibaldi

## 2020-12-31 NOTE — MAU Note (Signed)
Spoke with Dr Charlesetta Garibaldi regarding lab results, okay to proceed with MTX injection.

## 2021-01-02 ENCOUNTER — Inpatient Hospital Stay (HOSPITAL_COMMUNITY): Payer: 59

## 2021-01-02 ENCOUNTER — Encounter (HOSPITAL_COMMUNITY)
Admission: AD | Disposition: A | Discharge status: Home / Self Care | Payer: Self-pay | Source: Home / Self Care | Attending: Obstetrics and Gynecology

## 2021-01-02 ENCOUNTER — Observation Stay (HOSPITAL_COMMUNITY)
Admission: AD | Admit: 2021-01-02 | Discharge: 2021-01-03 | Disposition: A | Discharge status: Home / Self Care | Payer: 59 | Attending: Obstetrics & Gynecology | Admitting: Obstetrics & Gynecology

## 2021-01-02 ENCOUNTER — Observation Stay (HOSPITAL_COMMUNITY): Payer: 59 | Admitting: Anesthesiology

## 2021-01-02 ENCOUNTER — Encounter (HOSPITAL_COMMUNITY): Payer: Self-pay | Admitting: Obstetrics & Gynecology

## 2021-01-02 DIAGNOSIS — Z3A00 Weeks of gestation of pregnancy not specified: Secondary | ICD-10-CM | POA: Insufficient documentation

## 2021-01-02 DIAGNOSIS — J45909 Unspecified asthma, uncomplicated: Secondary | ICD-10-CM | POA: Diagnosis not present

## 2021-01-02 DIAGNOSIS — Z79899 Other long term (current) drug therapy: Secondary | ICD-10-CM | POA: Insufficient documentation

## 2021-01-02 DIAGNOSIS — Z20822 Contact with and (suspected) exposure to covid-19: Secondary | ICD-10-CM | POA: Diagnosis not present

## 2021-01-02 DIAGNOSIS — O26891 Other specified pregnancy related conditions, first trimester: Secondary | ICD-10-CM | POA: Diagnosis present

## 2021-01-02 DIAGNOSIS — O009 Unspecified ectopic pregnancy without intrauterine pregnancy: Principal | ICD-10-CM | POA: Diagnosis present

## 2021-01-02 DIAGNOSIS — R109 Unspecified abdominal pain: Secondary | ICD-10-CM

## 2021-01-02 DIAGNOSIS — I10 Essential (primary) hypertension: Secondary | ICD-10-CM | POA: Diagnosis not present

## 2021-01-02 HISTORY — PX: LAPAROSCOPY: SHX197

## 2021-01-02 LAB — CBC WITH DIFFERENTIAL/PLATELET
Abs Immature Granulocytes: 0.03 10*3/uL (ref 0.00–0.07)
Basophils Absolute: 0 10*3/uL (ref 0.0–0.1)
Basophils Relative: 0 %
Eosinophils Absolute: 0 10*3/uL (ref 0.0–0.5)
Eosinophils Relative: 0 %
HCT: 29.8 % — ABNORMAL LOW (ref 36.0–46.0)
Hemoglobin: 9.7 g/dL — ABNORMAL LOW (ref 12.0–15.0)
Immature Granulocytes: 0 %
Lymphocytes Relative: 21 %
Lymphs Abs: 1.6 10*3/uL (ref 0.7–4.0)
MCH: 30.7 pg (ref 26.0–34.0)
MCHC: 32.6 g/dL (ref 30.0–36.0)
MCV: 94.3 fL (ref 80.0–100.0)
Monocytes Absolute: 0.4 10*3/uL (ref 0.1–1.0)
Monocytes Relative: 5 %
Neutro Abs: 5.8 10*3/uL (ref 1.7–7.7)
Neutrophils Relative %: 74 %
Platelets: 219 10*3/uL (ref 150–400)
RBC: 3.16 MIL/uL — ABNORMAL LOW (ref 3.87–5.11)
RDW: 14.8 % (ref 11.5–15.5)
WBC: 7.9 10*3/uL (ref 4.0–10.5)
nRBC: 0 % (ref 0.0–0.2)

## 2021-01-02 LAB — CBC
HCT: 32 % — ABNORMAL LOW (ref 36.0–46.0)
Hemoglobin: 10.6 g/dL — ABNORMAL LOW (ref 12.0–15.0)
MCH: 31.3 pg (ref 26.0–34.0)
MCHC: 33.1 g/dL (ref 30.0–36.0)
MCV: 94.4 fL (ref 80.0–100.0)
Platelets: 243 10*3/uL (ref 150–400)
RBC: 3.39 MIL/uL — ABNORMAL LOW (ref 3.87–5.11)
RDW: 15 % (ref 11.5–15.5)
WBC: 8.3 10*3/uL (ref 4.0–10.5)
nRBC: 0 % (ref 0.0–0.2)

## 2021-01-02 LAB — COMPREHENSIVE METABOLIC PANEL
ALT: 16 U/L (ref 0–44)
AST: 17 U/L (ref 15–41)
Albumin: 3.4 g/dL — ABNORMAL LOW (ref 3.5–5.0)
Alkaline Phosphatase: 39 U/L (ref 38–126)
Anion gap: 8 (ref 5–15)
BUN: 7 mg/dL (ref 6–20)
CO2: 26 mmol/L (ref 22–32)
Calcium: 8.9 mg/dL (ref 8.9–10.3)
Chloride: 103 mmol/L (ref 98–111)
Creatinine, Ser: 0.98 mg/dL (ref 0.44–1.00)
GFR, Estimated: >60 mL/min (ref >60)
Glucose, Bld: 93 mg/dL (ref 70–99)
Potassium: 3.8 mmol/L (ref 3.5–5.1)
Sodium: 137 mmol/L (ref 135–145)
Total Bilirubin: 0.8 mg/dL (ref 0.3–1.2)
Total Protein: 7 g/dL (ref 6.5–8.1)

## 2021-01-02 LAB — TYPE AND SCREEN
ABO/RH(D): A NEG
Antibody Screen: POSITIVE

## 2021-01-02 LAB — RESP PANEL BY RT-PCR (FLU A&B, COVID) ARPGX2
Influenza A by PCR: NEGATIVE
Influenza B by PCR: NEGATIVE
SARS Coronavirus 2 by RT PCR: NEGATIVE

## 2021-01-02 LAB — HCG, QUANTITATIVE, PREGNANCY: hCG, Beta Chain, Quant, S: 35897 m[IU]/mL — ABNORMAL HIGH (ref <5)

## 2021-01-02 SURGERY — Surgical Case
Anesthesia: General

## 2021-01-02 SURGERY — LAPAROSCOPY OPERATIVE
Anesthesia: General | Laterality: Right

## 2021-01-02 MED ORDER — MIDAZOLAM HCL 5 MG/5ML IJ SOLN
INTRAMUSCULAR | Status: DC | PRN
  Administered 2021-01-02: 2 mg via INTRAVENOUS

## 2021-01-02 MED ORDER — OXYCODONE HCL 5 MG PO TABS
10.0000 mg | ORAL_TABLET | ORAL | Status: DC | PRN
  Administered 2021-01-02 – 2021-01-03 (×4): 10 mg via ORAL
  Filled 2021-01-02 (×4): qty 2

## 2021-01-02 MED ORDER — PROPOFOL 10 MG/ML IV BOLUS
INTRAVENOUS | Status: DC | PRN
  Administered 2021-01-02: 200 mg via INTRAVENOUS

## 2021-01-02 MED ORDER — ACETAMINOPHEN 500 MG PO TABS
1000.0000 mg | ORAL_TABLET | Freq: Four times a day (QID) | ORAL | Status: DC | PRN
  Administered 2021-01-03: 1000 mg via ORAL
  Filled 2021-01-02 (×2): qty 2

## 2021-01-02 MED ORDER — ACETAMINOPHEN 500 MG PO TABS: 1000.0000 mg | ORAL_TABLET | Freq: Once | ORAL | Status: DC | PRN

## 2021-01-02 MED ORDER — DOCUSATE SODIUM 100 MG PO CAPS: 100.0000 mg | ORAL_CAPSULE | Freq: Every day | ORAL | Status: DC

## 2021-01-02 MED ORDER — MORPHINE SULFATE (PF) 2 MG/ML IV SOLN
1.0000 mg | INTRAVENOUS | Status: DC | PRN
  Administered 2021-01-02 – 2021-01-03 (×2): 2 mg via INTRAVENOUS
  Filled 2021-01-02 (×2): qty 1

## 2021-01-02 MED ORDER — OXYCODONE-ACETAMINOPHEN 5-325 MG PO TABS: 1.0000 | ORAL_TABLET | Freq: Four times a day (QID) | ORAL | Status: DC | PRN

## 2021-01-02 MED ORDER — FENTANYL CITRATE (PF) 250 MCG/5ML IJ SOLN
INTRAMUSCULAR | Status: DC | PRN
  Administered 2021-01-02 (×5): 50 ug via INTRAVENOUS

## 2021-01-02 MED ORDER — ONDANSETRON HCL 4 MG/2ML IJ SOLN
INTRAMUSCULAR | Status: DC | PRN
  Administered 2021-01-02: 4 mg via INTRAVENOUS

## 2021-01-02 MED ORDER — EPHEDRINE SULFATE-NACL 50-0.9 MG/10ML-% IV SOSY
PREFILLED_SYRINGE | INTRAVENOUS | Status: DC | PRN
  Administered 2021-01-02: 5 mg via INTRAVENOUS

## 2021-01-02 MED ORDER — OXYCODONE HCL 5 MG PO TABS: 5.0000 mg | ORAL_TABLET | ORAL | Status: DC | PRN

## 2021-01-02 MED ORDER — OXYCODONE-ACETAMINOPHEN 5-325 MG PO TABS: 2.0000 | ORAL_TABLET | ORAL | Status: DC | PRN

## 2021-01-02 MED ORDER — ROCURONIUM BROMIDE 10 MG/ML (PF) SYRINGE
PREFILLED_SYRINGE | INTRAVENOUS | Status: DC | PRN
  Administered 2021-01-02: 50 mg via INTRAVENOUS
  Administered 2021-01-02: 20 mg via INTRAVENOUS

## 2021-01-02 MED ORDER — ONDANSETRON HCL 4 MG PO TABS: 4.0000 mg | ORAL_TABLET | Freq: Four times a day (QID) | ORAL | Status: DC | PRN

## 2021-01-02 MED ORDER — ONDANSETRON HCL 4 MG/2ML IJ SOLN
4.0000 mg | Freq: Once | INTRAMUSCULAR | Status: AC
  Administered 2021-01-02: 4 mg via INTRAVENOUS
  Filled 2021-01-02: qty 2

## 2021-01-02 MED ORDER — DEXAMETHASONE SODIUM PHOSPHATE 10 MG/ML IJ SOLN
INTRAMUSCULAR | Status: DC | PRN
  Administered 2021-01-02: 10 mg via INTRAVENOUS

## 2021-01-02 MED ORDER — SIMETHICONE 80 MG PO CHEW: 80.0000 mg | CHEWABLE_TABLET | Freq: Four times a day (QID) | ORAL | Status: DC | PRN

## 2021-01-02 MED ORDER — ENOXAPARIN SODIUM 40 MG/0.4ML IJ SOSY: 40.0000 mg | PREFILLED_SYRINGE | INTRAMUSCULAR | Status: DC

## 2021-01-02 MED ORDER — PHENYLEPHRINE 40 MCG/ML (10ML) SYRINGE FOR IV PUSH (FOR BLOOD PRESSURE SUPPORT)
PREFILLED_SYRINGE | INTRAVENOUS | Status: DC | PRN
  Administered 2021-01-02 (×6): 80 ug via INTRAVENOUS

## 2021-01-02 MED ORDER — BUPIVACAINE HCL (PF) 0.25 % IJ SOLN
INTRAMUSCULAR | Status: AC
  Filled 2021-01-02: qty 30

## 2021-01-02 MED ORDER — IBUPROFEN 600 MG PO TABS
600.0000 mg | ORAL_TABLET | Freq: Four times a day (QID) | ORAL | Status: DC
  Administered 2021-01-02 – 2021-01-03 (×3): 600 mg via ORAL
  Filled 2021-01-02 (×3): qty 1

## 2021-01-02 MED ORDER — PROPOFOL 10 MG/ML IV BOLUS
INTRAVENOUS | Status: AC
  Filled 2021-01-02: qty 20

## 2021-01-02 MED ORDER — OXYCODONE HCL 5 MG/5ML PO SOLN: 5.0000 mg | Freq: Once | ORAL | Status: DC | PRN

## 2021-01-02 MED ORDER — ACETAMINOPHEN 160 MG/5ML PO SOLN: 1000.0000 mg | Freq: Once | ORAL | Status: DC | PRN

## 2021-01-02 MED ORDER — CALCIUM CARBONATE ANTACID 500 MG PO CHEW: 2.0000 | CHEWABLE_TABLET | ORAL | Status: DC | PRN

## 2021-01-02 MED ORDER — OXYCODONE HCL 5 MG PO TABS
5.0000 mg | ORAL_TABLET | ORAL | Status: DC | PRN
  Filled 2021-01-02: qty 1

## 2021-01-02 MED ORDER — SUCCINYLCHOLINE CHLORIDE 200 MG/10ML IV SOSY
PREFILLED_SYRINGE | INTRAVENOUS | Status: DC | PRN
  Administered 2021-01-02: 120 mg via INTRAVENOUS

## 2021-01-02 MED ORDER — SUGAMMADEX SODIUM 500 MG/5ML IV SOLN
INTRAVENOUS | Status: DC | PRN
  Administered 2021-01-02: 300 mg via INTRAVENOUS

## 2021-01-02 MED ORDER — BUPIVACAINE HCL (PF) 0.25 % IJ SOLN
INTRAMUSCULAR | Status: DC | PRN
  Administered 2021-01-02: 30 mL

## 2021-01-02 MED ORDER — ACETAMINOPHEN 10 MG/ML IV SOLN: 1000.0000 mg | Freq: Once | INTRAVENOUS | Status: DC | PRN

## 2021-01-02 MED ORDER — ZOLPIDEM TARTRATE 5 MG PO TABS: 5.0000 mg | ORAL_TABLET | Freq: Every evening | ORAL | Status: DC | PRN

## 2021-01-02 MED ORDER — PHENYLEPHRINE HCL (PRESSORS) 10 MG/ML IV SOLN
INTRAVENOUS | Status: AC
  Filled 2021-01-02: qty 1

## 2021-01-02 MED ORDER — ALBUMIN HUMAN 5 % IV SOLN: INTRAVENOUS | Status: DC | PRN

## 2021-01-02 MED ORDER — SODIUM CHLORIDE 0.9 % IR SOLN
Status: DC | PRN
  Administered 2021-01-02: 3000 mL

## 2021-01-02 MED ORDER — PHENYLEPHRINE HCL-NACL 10-0.9 MG/250ML-% IV SOLN
INTRAVENOUS | Status: DC | PRN
  Administered 2021-01-02: 25 ug/min via INTRAVENOUS

## 2021-01-02 MED ORDER — OXYCODONE HCL 5 MG PO TABS: 5.0000 mg | ORAL_TABLET | Freq: Once | ORAL | Status: DC | PRN

## 2021-01-02 MED ORDER — ENOXAPARIN SODIUM 60 MG/0.6ML IJ SOSY
60.0000 mg | PREFILLED_SYRINGE | INTRAMUSCULAR | Status: DC
  Administered 2021-01-03: 60 mg via SUBCUTANEOUS
  Filled 2021-01-02: qty 0.6

## 2021-01-02 MED ORDER — DIPHENHYDRAMINE HCL 50 MG/ML IJ SOLN
INTRAMUSCULAR | Status: DC | PRN
  Administered 2021-01-02: 6.25 mg via INTRAVENOUS

## 2021-01-02 MED ORDER — ACETAMINOPHEN 325 MG PO TABS: 650.0000 mg | ORAL_TABLET | ORAL | Status: DC | PRN

## 2021-01-02 MED ORDER — METHOTREXATE FOR ECTOPIC PREGNANCY
50.0000 mg/m2 | Freq: Once | INTRAMUSCULAR | Status: DC
  Filled 2021-01-02: qty 4.8

## 2021-01-02 MED ORDER — PANTOPRAZOLE SODIUM 40 MG IV SOLR
40.0000 mg | Freq: Every day | INTRAVENOUS | Status: DC
  Administered 2021-01-02: 40 mg via INTRAVENOUS
  Filled 2021-01-02: qty 40

## 2021-01-02 MED ORDER — ONDANSETRON HCL 4 MG/2ML IJ SOLN
4.0000 mg | Freq: Four times a day (QID) | INTRAMUSCULAR | Status: DC | PRN
  Administered 2021-01-03: 4 mg via INTRAVENOUS
  Filled 2021-01-02: qty 2

## 2021-01-02 MED ORDER — MIDAZOLAM HCL 2 MG/2ML IJ SOLN
INTRAMUSCULAR | Status: AC
  Filled 2021-01-02: qty 2

## 2021-01-02 MED ORDER — LACTATED RINGERS IV SOLN: INTRAVENOUS | Status: DC | PRN

## 2021-01-02 MED ORDER — FENTANYL CITRATE (PF) 250 MCG/5ML IJ SOLN
INTRAMUSCULAR | Status: AC
  Filled 2021-01-02: qty 5

## 2021-01-02 MED ORDER — FENTANYL CITRATE (PF) 100 MCG/2ML IJ SOLN: 25.0000 ug | INTRAMUSCULAR | Status: DC | PRN

## 2021-01-02 MED ORDER — LIDOCAINE 2% (20 MG/ML) 5 ML SYRINGE
INTRAMUSCULAR | Status: DC | PRN
  Administered 2021-01-02: 100 mg via INTRAVENOUS

## 2021-01-02 MED ORDER — PRENATAL MULTIVITAMIN CH: 1.0000 | ORAL_TABLET | Freq: Every day | ORAL | Status: DC

## 2021-01-02 SURGICAL SUPPLY — 34 items
APL SRG 38 LTWT LNG FL B (MISCELLANEOUS) ×2
APPLICATOR ARISTA FLEXITIP XL (MISCELLANEOUS) ×2 IMPLANT
BAG SPEC RTRVL LRG 6X4 10 (ENDOMECHANICALS) ×6
CABLE HIGH FREQUENCY MONO STRZ (ELECTRODE) IMPLANT
DRSG OPSITE POSTOP 3X4 (GAUZE/BANDAGES/DRESSINGS) ×2 IMPLANT
GLOVE SURG ENC MOIS LTX SZ6.5 (GLOVE) ×6 IMPLANT
GLOVE SURG UNDER POLY LF SZ6.5 (GLOVE) ×3 IMPLANT
GLOVE SURG UNDER POLY LF SZ7 (GLOVE) ×6 IMPLANT
GOWN STRL REUS W/ TWL LRG LVL3 (GOWN DISPOSABLE) ×4 IMPLANT
GOWN STRL REUS W/TWL LRG LVL3 (GOWN DISPOSABLE) ×6
HEMOSTAT ARISTA ABSORB 3G PWDR (HEMOSTASIS) ×2 IMPLANT
KIT TURNOVER KIT B (KITS) ×3 IMPLANT
LIGASURE VESSEL 5MM BLUNT TIP (ELECTROSURGICAL) IMPLANT
NS IRRIG 1000ML POUR BTL (IV SOLUTION) ×3 IMPLANT
PACK LAPAROSCOPY BASIN (CUSTOM PROCEDURE TRAY) ×3 IMPLANT
PACK TRENDGUARD 450 HYBRID PRO (MISCELLANEOUS) ×1 IMPLANT
POUCH SPECIMEN RETRIEVAL 10MM (ENDOMECHANICALS) ×6 IMPLANT
PROTECTOR NERVE ULNAR (MISCELLANEOUS) ×6 IMPLANT
SET IRRIG TUBING LAPAROSCOPIC (IRRIGATION / IRRIGATOR) ×2 IMPLANT
SET TUBE SMOKE EVAC HIGH FLOW (TUBING) ×3 IMPLANT
SLEEVE ENDOPATH XCEL 5M (ENDOMECHANICALS) ×3 IMPLANT
SOLUTION ELECTROLUBE (MISCELLANEOUS) IMPLANT
SUT VIC AB 4-0 PS2 27 (SUTURE) ×2 IMPLANT
SUT VICRYL 0 UR6 27IN ABS (SUTURE) ×2 IMPLANT
SUT VICRYL 4-0 PS2 18IN ABS (SUTURE) ×3 IMPLANT
TOWEL GREEN STERILE FF (TOWEL DISPOSABLE) ×6 IMPLANT
TRAY FOLEY W/BAG SLVR 14FR (SET/KITS/TRAYS/PACK) ×3 IMPLANT
TRENDGUARD 450 HYBRID PRO PACK (MISCELLANEOUS) ×3
TROCAR 12M 150ML BLUNT (TROCAR) ×2 IMPLANT
TROCAR 5M 150ML BLDLS (TROCAR) ×4 IMPLANT
TROCAR BALLN 12MMX100 BLUNT (TROCAR) ×2 IMPLANT
TROCAR XCEL NON-BLD 11X100MML (ENDOMECHANICALS) ×2 IMPLANT
TROCAR XCEL NON-BLD 5MMX100MML (ENDOMECHANICALS) ×3 IMPLANT
WARMER LAPAROSCOPE (MISCELLANEOUS) ×3 IMPLANT

## 2021-01-02 NOTE — MAU Provider Note (Signed)
Patient Sheri Robinson is a 40 y.o. G2P0100 At [redacted]w[redacted]d here with complaints of abdominal pain. She denies vaginal bleeding, vaginal discharge, vomiting, diarrhea, fever, SOB.    She was diagnosed with a right ectopic pregnancy on Thursday, July 7th and came to MAU on Friday, July 8 for MTX treatment. BHCG on Friday was a 35,000. She was supposed to follow up on Monday at [facility]. Dr. Charlesetta Garibaldi saw her in person in MAU on Friday, July 8.   Patient went home on Friday after injection, and the pain got worse. She took two Tylenol and fell asleep. During the day on Saturday her pain was better, and then it got worse again on Saturday night. It felt like "pressure on her pelvis".  History     CSN: [id number]  Arrival date and time: 01/02/21 5852   Event Date/Time   First Provider Initiated Contact with Patient 01/02/21 0120      Chief Complaint  Patient presents with   Pelvic Pain    Sharp shooting pain   Back Pain   Pelvic Pain The patient's primary symptoms include pelvic pain. Associated symptoms include abdominal pain, back pain and nausea. Pertinent negatives include no diarrhea, dysuria, fever or vomiting.  Back Pain Associated symptoms include abdominal pain and pelvic pain. Pertinent negatives include no dysuria or fever.  Abdominal Pain This is a new problem. The current episode started in the past 7 days. The problem occurs intermittently. The pain is at a severity of 8/10. The abdominal pain radiates to the right shoulder. Associated symptoms include nausea. Pertinent negatives include no diarrhea, dysuria, fever or vomiting. The pain is aggravated by movement.   OB History     Gravida  2   Para  1   Term  0   Preterm  1   AB  0   Living  0      SAB  0   IAB  0   Ectopic  0   Multiple  0   Live Births  0        Obstetric Comments  Fibroids caused pt to go into preterm labor causing preterm delivery.         Past Medical History:  Diagnosis Date    Anemia    Arthritis    Asthma    Community acquired pneumonia of left lung 12/26/2016   improving   Cough 12/26/2016   DVT (deep venous thrombosis) (HCC)    Eczema    GERD (gastroesophageal reflux disease)    Heart murmur    echo done [facility]- neg   Hypertension    Miscarriage    Miscarriage 2013   had fibroids- lost blood- 3 tranfusions- developed dvt, pe 2014   Peripheral vascular disease ([place]) 2014   pe, dvt  left leg   rx           Pneumonia    Pulmonary embolism ([place])     Past Surgical History:  Procedure Laterality Date   CHOLECYSTECTOMY N/A 04/05/2016   Procedure: LAPAROSCOPIC CHOLECYSTECTOMY WITH INTRAOPERATIVE CHOLANGIOGRAM;  Surgeon: Autumn Messing III, MD;  Location: [facility];  Service: General;  Laterality: N/A;   IVC FILTER PLACEMENT ([place] HX)  2014   removed 5 2014   MYOMECTOMY      Family History  Problem Relation Age of Onset   Hypertension Mother    Diabetes Mother    Stroke Father    Allergic rhinitis Neg Hx    Angioedema Neg  Hx    Asthma Neg Hx    Atopy Neg Hx    Eczema Neg Hx    Immunodeficiency Neg Hx    Urticaria Neg Hx     Social History   Tobacco Use   Smoking status: Never   Smokeless tobacco: Never  Vaping Use   Vaping Use: Never used  Substance Use Topics   Alcohol use: No    Alcohol/week: 0.0 standard drinks   Drug use: No    Allergies:  Allergies  Allergen Reactions   Amoxicillin Anaphylaxis   Penicillins Anaphylaxis    Medications Prior to Admission  Medication Sig Dispense Refill Last Dose   albuterol (VENTOLIN HFA) 108 (90 Base) MCG/ACT inhaler Inhale into the lungs every 6 (six) hours as needed for wheezing or shortness of breath.   Past Week   montelukast (SINGULAIR) 10 MG tablet Take 10 mg by mouth at bedtime.   01/01/2021 at 1000   NIFEdipine (PROCARDIA-XL/NIFEDICAL-XL) 30 MG 24 hr tablet Take 30 mg by mouth daily.   01/01/2021 at 1000   omeprazole (PRILOSEC) 20 MG capsule Take 1 capsule (20 mg total) daily  by mouth. 90 capsule 3 01/01/2021 at 1000   amLODipine (NORVASC) 10 MG tablet TAKE 1 TABLET BY MOUTH EVERY DAY (Patient not taking: Reported on 01/02/2021) 15 tablet 0 Not Taking   cholestyramine (QUESTRAN) 4 g packet Take 1 packet by mouth daily.      fluticasone furoate-vilanterol (BREO ELLIPTA) 100-25 MCG/INH AEPB Inhale 1 puff into the lungs daily. Rinse mouth after each use 60 each 3    hydrOXYzine (ATARAX/VISTARIL) 50 MG tablet Take 50 mg by mouth daily. (Patient not taking: Reported on 01/02/2021)   Not Taking   olmesartan (BENICAR) 20 MG tablet Take 1 tablet by mouth daily. (Patient not taking: Reported on 01/02/2021)   Not Taking   sertraline (ZOLOFT) 25 MG tablet Take 25 mg by mouth daily. (Patient not taking: Reported on 01/02/2021)   Not Taking   valACYclovir (VALTREX) 500 MG tablet  (Patient not taking: Reported on 01/02/2021)  2 Not Taking    Review of Systems  Constitutional:  Negative for fever.  Respiratory: Negative.    Gastrointestinal:  Positive for abdominal pain and nausea. Negative for diarrhea and vomiting.  Genitourinary:  Positive for pelvic pain. Negative for dysuria.  Musculoskeletal:  Positive for back pain.  Physical Exam   Blood pressure 104/80, pulse 81, temperature 98.1 F (36.7 C), temperature source Oral, resp. rate 17, height 5\' 7"  (1.702 m), weight 120.2 kg, last menstrual period 11/10/2020, SpO2 99 %.  Physical Exam Constitutional:      Appearance: Normal appearance.  HENT:     Head: Normocephalic.  Pulmonary:     Effort: Pulmonary effort is normal.  Abdominal:     Tenderness: There is abdominal tenderness. There is no guarding.  Musculoskeletal:        General: Normal range of motion.  Skin:    General: Skin is warm.  Neurological:     Mental Status: She is alert.  Psychiatric:        Mood and Affect: Mood normal.   MAU Course  Procedures  MDM Repeat bHCG is 35,897; virtually unchanged from Friday.  US shows continued right ectopic pregnancy  measuring 2.5 x 2.5 x 2.5;  TC to [facility] to review results, beta from THursday was 12/30/2020 was 27,000 and ectopic was 2.5 cm by 2.5 cm by 2.5 TC to Dr. Roselie Awkward to discus patient's presentation; he recommends  option of surgery vs. Repeat MTX.  TC to Dr. Delora Fuel; Dr. Delora Fuel comfortable with either option.  Discussion with patient who initially wanted MTX but then called CNM back to room to state that she and her cousin decided that she should do surgery. Patient has had oxycodone but still appears uncomfortable in bed; she is rocking and reporting that pain is shooting to her right arm.   TC to Dr. Delora Fuel, who agrees with management and will have [facility] CNM come and see patient.   Assessment and Plan    -plan for admit and post for surgery  [place] 01/02/2021, 4:17 AM

## 2021-01-02 NOTE — Op Note (Addendum)
Pre Op Dx:  Suspected ruptured ectopic pregnancy  Post Op Dx:   Ruptured right tubal ectopic pregnancy  Procedure:  Laparoscopic right salpingectomy  Surgeon:  Dr. Drema Dallas Assistants:  Dr. Irene Pap and Dr. Darron Doom (Assistants were need due to complexity of the case and the anatomy) Anesthesia:  General  EBL:  5cc  IVF:  1500cc crystalloid and 500cc albumin UOP:  150cc  Drains:  Foley catheter Specimen removed:  Right fallopian tube containing right ectopic pregnancy - sent to pathology Device(s) implanted:  None Case Type:  Clean-contaminated Findings: Enlarged ~12-14cm sized fibroid uterus with moderate adhesions of fibroids to anterior abdominal wall. Omental and small bowel adhesions to the anterior abdominal wall in the midline. Unable to visualize left fallopian tube and ovary due to scarring and limited uterine mobility due to adhesive disease. Right tubal ectopic found and appeared ~4-5cm in size. About 50cc hemoperitoneum present. Complications: None Indications:  40 y.o. G2P0100 with known right ectopic pregnancy s/p MTX on 7/8 who was admitted for observation for RLQ. On admission, she was hemodynamically stable. Over the course of ~12 hours, Hemoglobin dropped from 10.6 to 9.7 and without improvement in her pain with medications. Description of procedure:  After informed consent the patient was taken to the operating room and placed in dorsal supine position where general endotracheal anesthesia was administered and found to be adequate.  She was placed in dorsal lithotomy position.  She was prepped and draped in the usual sterile fashion.  A Hulka uterine manipulator with and a Foley catheter were placed.  A timeout was called and the procedure confirmed.  Attempted to enter the abdomen multiple times using the Veress needle at Palmer's point; however, the needle was not long enough for patient's body habitus. The abdomen was then entered at Palmer's point using  through a 71mm incision under direct visualization and pneumoperitoneum established. The findings as above were noted. A 13mm right lower quadrant port was placed under direct visualization along with a 37mm right lower quadrant port and then a 76mm supraumbilical port. All port sites were injected with local anesthetic prior to port placement.The entry points were visualized from an inferior port and atraumatic entry confirmed.   Hemoperitoneum was evacuated.  The dilated right fallopian tube containing the ectopic pregnancy was removed from its attachment to the normal-appearing right ovary starting at the fimbria and divided along the mesosalpinx to the level of the uterus.  The tube was desiccated thoroughly and divided from the uterus. The specimen was removed with great difficulty due to size of the ectopic pregnancy.  Irrigation was performed. There was noted to be minimal oozing at the right cornua and Arista hemostatic agent was placed. Adequate hemostasis was noted. The remaining ports were withdrawn under direct visualization.  The pneumoperitoneum was reduced completely and the camera port withdrawn under visualization. The 110mm port site fascia was closed using 0-Vicryl. The skin was closed with 4-0 Vicryl in subcuticular fashion. Dermabond placed atop each incision. The Hulka tenaculum and foley catheter were removed at the end of the case. The patient was returned to dorsal supine position, awakened and extubated in the OR having appeared to tolerate the procedure well.  All sponge, needle, and instrument counts were correct x 2 at the end of the case.    Disposition:  PACU  Drema Dallas, DO

## 2021-01-02 NOTE — Anesthesia Preprocedure Evaluation (Signed)
Anesthesia Evaluation  Patient identified by MRN, date of birth, ID band Patient awake    Reviewed: Allergy & Precautions, NPO status , Patient's Chart, lab work & pertinent test results  History of Anesthesia Complications Negative for: history of anesthetic complications  Airway Mallampati: III  TM Distance: >3 FB Neck ROM: Full    Dental  (+) Dental Advisory Given, Teeth Intact   Pulmonary neg shortness of breath, asthma , neg sleep apnea, neg COPD, neg recent URI,    breath sounds clear to auscultation       Cardiovascular hypertension,  Rhythm:Regular     Neuro/Psych negative neurological ROS  negative psych ROS   GI/Hepatic Neg liver ROS, GERD  Medicated and Controlled,  Endo/Other  Morbid obesity  Renal/GU negative Renal ROS     Musculoskeletal negative musculoskeletal ROS (+)   Abdominal   Peds  Hematology  (+) Blood dyscrasia, anemia , Lab Results      Component                Value               Date                      WBC                      7.9                 01/02/2021                HGB                      9.7 (L)             01/02/2021                HCT                      29.8 (L)            01/02/2021                MCV                      94.3                01/02/2021                PLT                      219                 01/02/2021              Anesthesia Other Findings   Reproductive/Obstetrics (+) Pregnancy ectopic pregnancy                             Anesthesia Physical Anesthesia Plan  ASA: 2 and emergent  Anesthesia Plan: General   Post-op Pain Management:    Induction: Intravenous  PONV Risk Score and Plan: 3  Airway Management Planned: Oral ETT  Additional Equipment: None  Intra-op Plan:   Post-operative Plan: Extubation in OR  Informed Consent: I have reviewed the patients History and Physical, chart, labs and discussed the  procedure including the risks, benefits and alternatives for the proposed anesthesia with the  patient or authorized representative who has indicated his/her understanding and acceptance.     Dental advisory given  Plan Discussed with: CRNA and Surgeon  Anesthesia Plan Comments:         Anesthesia Quick Evaluation

## 2021-01-02 NOTE — Progress Notes (Addendum)
In to perform interval assessment. Patient rates her pain 6/10. States the narcotic pain medication just makes her feel groggy. She also states she feels very hungry too which is not helping.  Reports some spotting and cramping. Reports lower abdominal pain with coughing/sneezing. Has not been on IVFs throughout the day and has remained NPO.  On exam, patient appears well. Abdomen soft, non-distended, tender in RLQ, no rebound/guarding.  Reviewed her CBC - Hgb has dropped to 9.7 from 10.6. Patient has not had any significant improvement in her pain. I counseled patient that given the drop in Hgb and unresolved pain, recommend proceeding with surgical management - patient in agreement with plan of care. Consents previously signed and witnessed. OR case posted with main OR - anticipated to start at ~1800 since there is an emergent case underway currently.  CBC Latest Ref Rng & Units 01/02/2021 01/02/2021 12/31/2020  WBC 4.0 - 10.5 K/uL 7.9 8.3 9.1  Hemoglobin 12.0 - 15.0 g/dL 9.7(L) 10.6(L) 10.6(L)  Hematocrit 36.0 - 46.0 % 29.8(L) 32.0(L) 33.3(L)  Platelets 150 - 400 K/uL [address] [place], DO

## 2021-01-02 NOTE — MAU Note (Signed)
Pt reports sharp shooting pain in lower pelvic area that started yesterday afternoon resolved and returned tonight with greater intensity. Also reports lower back pain. Reports single "gush"of ?red blood yesterday and today pink spotting. Pt received methotrexate Friday.

## 2021-01-02 NOTE — MAU Note (Addendum)
Called to room via pt-pt states she no longer wants to continue with methotrexate-she would like to proceed with surgery. CNM notified of pt request

## 2021-01-02 NOTE — H&P (Addendum)
Sheri Robinson is a 40 y.o. female, G2P0100, IUP at 7.4 weeks weeks, presenting for 24 hours observation for known ectopic pregnancy. At [redacted]w[redacted]d here with complaints of abdominal pain. She denies vaginal bleeding, vaginal discharge, vomiting, diarrhea, fever, SOB. 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   6/30 Beta HCG 10,790, Blood type A-, rhogam given 6/30.   Dx with ectopic on 7/7 Beta was 27167 and ultrasound showing a probable right ectopic pregnancy with no evidence of pregnancy in the uterine cavity. (Anteverted Uterus 6.28 x 5.23 x 7.29 cm; Endometrium 32.30 mm appears thickened and heterogeneous  Fibroids #1 3.50 cm Right posterior fundal intramural #2 2.60 cm Left posterior fundal intramural  L ovary 2.43cm WNL and left adnexa unremarkable  R ovary 2.45cm contains likely corpus luteum measuring 1.4 cm in right adnexa, adjacent to R ovary there appears to be a complex mass w/ ring of color measures 2.5 x 2.5 x 2.4cm--- there is a central cystic component that is likely gestation sac measuring 1.6 x 1.2 x 1.5 cm ( 6 weeks 1 days)--- sonographic appearance concerning for ectopic pregnancy given beta hcg  # fibroids: 3.5 cm and 2.60 cm Trace fluid culdesac)  7/7 at [facility] Discussed and recommended Methotrexate due to patient's history of DVT/PE and previous abdominal surgeries  (myomectomy and cholecystectomy).   7/8 Beta Hcg 35,804  7/10: pt presented to MAU for abdominal pain. Per MAU: Pt reports sharp shooting pain in lower pelvic area that started yesterday afternoon resolved and returned tonight with greater intensity. Resolved with percept in MAU. Also reports lower back pain. Reports single "gush"of ?red blood yesterday and today pink spotting. Pt received methotrexate Friday. Beta today was 35,897. US showed IMPRESSION: No evidence of intrauterine gestation. There are changes consistent with an ectopic pregnancy in the .right.  adnexa/ovary which  are consistent with the patient's given clinical history. No fetal pole or yolk sac is noted at this time. Mild free fluid. Uterine fibroid stable from prior exams.  Patient Active Problem List   Diagnosis Date Noted   Reactive airway disease without complication 27/08/5007   Frequent episodes of bronchitis and pneumonia 10/13/2020   Drug reaction 10/13/2020   Chronic rhinitis 10/13/2020   Rash and other nonspecific skin eruption 10/13/2020   Essential hypertension 02/02/2017   Gastroesophageal reflux disease 02/02/2017   Viral enteritis 12/05/2016   Gallstones 04/05/2016     Active Ambulatory Problems    Diagnosis Date Noted   Gallstones 04/05/2016   Viral enteritis 12/05/2016   Essential hypertension 02/02/2017   Gastroesophageal reflux disease 02/02/2017   Reactive airway disease without complication 38/18/2993   Frequent episodes of bronchitis and pneumonia 10/13/2020   Drug reaction 10/13/2020   Chronic rhinitis 10/13/2020   Rash and other nonspecific skin eruption 10/13/2020   Resolved Ambulatory Problems    Diagnosis Date Noted   Anemia 12/30/2015   Community acquired pneumonia of left lung 12/26/2016   Cough 12/26/2016   Past Medical History:  Diagnosis Date   Arthritis    Asthma    DVT (deep venous thrombosis) (HCC)    Eczema    GERD (gastroesophageal reflux disease)    Heart murmur    Hypertension    Miscarriage    Miscarriage 2013   Peripheral vascular disease ([place]) 2014   Pneumonia    Pulmonary embolism (HCC)       Medications Prior to Admission  Medication Sig Dispense Refill Last Dose   albuterol (VENTOLIN  HFA) 108 (90 Base) MCG/ACT inhaler Inhale into the lungs every 6 (six) hours as needed for wheezing or shortness of breath.   Past Week   montelukast (SINGULAIR) 10 MG tablet Take 10 mg by mouth at bedtime.   01/01/2021 at 1000   NIFEdipine (PROCARDIA-XL/NIFEDICAL-XL) 30 MG 24 hr tablet Take 30 mg by mouth daily.   01/01/2021 at 1000   omeprazole  (PRILOSEC) 20 MG capsule Take 1 capsule (20 mg total) daily by mouth. 90 capsule 3 01/01/2021 at 1000   amLODipine (NORVASC) 10 MG tablet TAKE 1 TABLET BY MOUTH EVERY DAY (Patient not taking: Reported on 01/02/2021) 15 tablet 0 Not Taking   cholestyramine (QUESTRAN) 4 g packet Take 1 packet by mouth daily.      fluticasone furoate-vilanterol (BREO ELLIPTA) 100-25 MCG/INH AEPB Inhale 1 puff into the lungs daily. Rinse mouth after each use 60 each 3    hydrOXYzine (ATARAX/VISTARIL) 50 MG tablet Take 50 mg by mouth daily. (Patient not taking: Reported on 01/02/2021)   Not Taking   olmesartan (BENICAR) 20 MG tablet Take 1 tablet by mouth daily. (Patient not taking: Reported on 01/02/2021)   Not Taking   sertraline (ZOLOFT) 25 MG tablet Take 25 mg by mouth daily. (Patient not taking: Reported on 01/02/2021)   Not Taking   valACYclovir (VALTREX) 500 MG tablet  (Patient not taking: Reported on 01/02/2021)  2 Not Taking    Past Medical History:  Diagnosis Date   Anemia    Arthritis    Asthma    Community acquired pneumonia of left lung 12/26/2016   improving   Cough 12/26/2016   DVT (deep venous thrombosis) (HCC)    Eczema    GERD (gastroesophageal reflux disease)    Heart murmur    echo done 2014-[facility]- neg   Hypertension    Miscarriage    Miscarriage 2013   had fibroids- lost blood- 3 tranfusions- developed dvt, pe 2014   Peripheral vascular disease ([place]) 2014   pe, dvt  left leg   rx           Pneumonia    Pulmonary embolism ([place])      No current facility-administered medications on file prior to encounter.   Current Outpatient Medications on File Prior to Encounter  Medication Sig Dispense Refill   albuterol (VENTOLIN HFA) 108 (90 Base) MCG/ACT inhaler Inhale into the lungs every 6 (six) hours as needed for wheezing or shortness of breath.     montelukast (SINGULAIR) 10 MG tablet Take 10 mg by mouth at bedtime.     NIFEdipine (PROCARDIA-XL/NIFEDICAL-XL) 30 MG 24 hr tablet  Take 30 mg by mouth daily.     omeprazole (PRILOSEC) 20 MG capsule Take 1 capsule (20 mg total) daily by mouth. 90 capsule 3   amLODipine (NORVASC) 10 MG tablet TAKE 1 TABLET BY MOUTH EVERY DAY (Patient not taking: Reported on 01/02/2021) 15 tablet 0   cholestyramine (QUESTRAN) 4 g packet Take 1 packet by mouth daily.     fluticasone furoate-vilanterol (BREO ELLIPTA) 100-25 MCG/INH AEPB Inhale 1 puff into the lungs daily. Rinse mouth after each use 60 each 3   hydrOXYzine (ATARAX/VISTARIL) 50 MG tablet Take 50 mg by mouth daily. (Patient not taking: Reported on 01/02/2021)     olmesartan (BENICAR) 20 MG tablet Take 1 tablet by mouth daily. (Patient not taking: Reported on 01/02/2021)     sertraline (ZOLOFT) 25 MG tablet Take 25 mg by mouth daily. (Patient not taking: Reported  on 01/02/2021)     valACYclovir (VALTREX) 500 MG tablet  (Patient not taking: Reported on 01/02/2021)  2     Allergies  Allergen Reactions   Amoxicillin Anaphylaxis   Penicillins Anaphylaxis    OB History     Gravida  2   Para  1   Term  0   Preterm  1   AB  0   Living  0      SAB  0   IAB  0   Ectopic  0   Multiple  0   Live Births  0        Obstetric Comments  Fibroids caused pt to go into preterm labor causing preterm delivery.        Past Medical History:  Diagnosis Date   Anemia    Arthritis    Asthma    Community acquired pneumonia of left lung 12/26/2016   improving   Cough 12/26/2016   DVT (deep venous thrombosis) (HCC)    Eczema    GERD (gastroesophageal reflux disease)    Heart murmur    echo done 2014-[facility]- neg   Hypertension    Miscarriage    Miscarriage 2013   had fibroids- lost blood- 3 tranfusions- developed dvt, pe 2014   Peripheral vascular disease ([place]) 2014   pe, dvt  left leg   rx           Pneumonia    Pulmonary embolism ([place])    Past Surgical History:  Procedure Laterality Date   CHOLECYSTECTOMY N/A 04/05/2016   Procedure: LAPAROSCOPIC  CHOLECYSTECTOMY WITH INTRAOPERATIVE CHOLANGIOGRAM;  Surgeon: Autumn Messing III, MD;  Location: [place];  Service: General;  Laterality: N/A;   IVC FILTER PLACEMENT ([place] [facility])  2014   removed 5 2014   MYOMECTOMY     Family History: family history includes Diabetes in her mother; Hypertension in her mother; Stroke in her father. Social History:  reports that she has never smoked. She has never used smokeless tobacco. She reports that she does not drink alcohol and does not use drugs.  ROS:  Review of Systems  Constitutional: Negative.   HENT: Negative.    Eyes: Negative.   Respiratory: Negative.    Cardiovascular: Negative.   Gastrointestinal:  Positive for abdominal pain.  Genitourinary: Negative.   Musculoskeletal: Negative.   Skin: Negative.   Neurological: Negative.   Endo/Heme/Allergies: Negative.   Psychiatric/Behavioral: Negative.      Physical Exam: BP (!) 110/50   Pulse 72   Temp 98.3 F (36.8 C) (Oral)   Resp 20   Ht 5\' 7"  (1.702 m)   Wt 120.2 kg   LMP 11/10/2020   SpO2 98%   BMI 41.50 kg/m   Physical Exam Constitutional:      Appearance: Normal appearance. She is obese.  HENT:     Head: Normocephalic and atraumatic.     Nose: Nose normal.     Mouth/Throat:     Mouth: Mucous membranes are moist.  Eyes:     Conjunctiva/sclera: Conjunctivae normal.  Cardiovascular:     Pulses: Normal pulses.     Heart sounds: Normal heart sounds.  Pulmonary:     Effort: Pulmonary effort is normal.     Breath sounds: Normal breath sounds.  Abdominal:     General: Bowel sounds are normal. There is no distension.     Palpations: There is no mass.     Tenderness: There is abdominal tenderness. There is no  right CVA tenderness, left CVA tenderness, guarding or rebound.     Hernia: No hernia is present.     Comments: Tenderness across lower abdomen.   Genitourinary:    Comments: Deferred  Musculoskeletal:        General: Normal range of motion.  Skin:    General: Skin is warm.      Capillary Refill: Capillary refill takes less than 2 seconds.  Neurological:     General: No focal deficit present.     Mental Status: She is alert.  Psychiatric:        Mood and Affect: Mood normal.      Labs: Results for orders placed or performed during the hospital encounter of 01/02/21 (from the past 24 hour(s))  CBC     Status: Abnormal   Collection Time: 01/02/21  2:10 AM  Result Value Ref Range   WBC 8.3 4.0 - 10.5 K/uL   RBC 3.39 (L) 3.87 - 5.11 MIL/uL   Hemoglobin 10.6 (L) 12.0 - 15.0 g/dL   HCT 32.0 (L) 36.0 - 46.0 %   MCV 94.4 80.0 - 100.0 fL   MCH 31.3 26.0 - 34.0 pg   MCHC 33.1 30.0 - 36.0 g/dL   RDW 15.0 11.5 - 15.5 %   Platelets 243 150 - 400 K/uL   nRBC 0.0 0.0 - 0.2 %  Comprehensive metabolic panel     Status: Abnormal   Collection Time: 01/02/21  2:10 AM  Result Value Ref Range   Sodium 137 135 - 145 mmol/L   Potassium 3.8 3.5 - 5.1 mmol/L   Chloride 103 98 - 111 mmol/L   CO2 26 22 - 32 mmol/L   Glucose, Bld 93 70 - 99 mg/dL   BUN 7 6 - 20 mg/dL   Creatinine, Ser 0.98 0.44 - 1.00 mg/dL   Calcium 8.9 8.9 - 10.3 mg/dL   Total Protein 7.0 6.5 - 8.1 g/dL   Albumin 3.4 (L) 3.5 - 5.0 g/dL   AST 17 15 - 41 U/L   ALT 16 0 - 44 U/L   Alkaline Phosphatase 39 38 - 126 U/L   Total Bilirubin 0.8 0.3 - 1.2 mg/dL   GFR, Estimated >60 >60 mL/min   Anion gap 8 5 - 15  Type and screen [place]     Status: None   Collection Time: 01/02/21  2:10 AM  Result Value Ref Range   ABO/RH(D) A NEG    Antibody Screen POS    Sample Expiration 01/05/2021,2359    Antibody Identification      PASSIVELY ACQUIRED ANTI-D Performed at [facility], [address], [place], [place] [address]   hCG, quantitative, pregnancy     Status: Abnormal   Collection Time: 01/02/21  2:10 AM  Result Value Ref Range   hCG, Beta Chain, Quant, S 35,897 (H) <5 mIU/mL    Imaging:  US OB Transvaginal  Result Date: 01/02/2021 CLINICAL DATA:  Follow-up gestational  sac from 12/23/2020, rising beta HCG of 35,000 and recent methotrexate administration for ectopic pregnancy. Slight increase in pelvic pain today EXAM: TRANSVAGINAL OB ULTRASOUND TECHNIQUE: Transvaginal ultrasound was performed for complete evaluation of the gestation as well as the maternal uterus, adnexal regions, and pelvic cul-de-sac. COMPARISON:  12/23/2020 FINDINGS: Intrauterine gestational sac: Absent. Maternal uterus/adnexae: Prominent endometrium is noted consistent with the positive pregnancy test although the previously seen gestational sac is not present. There is however a new cystic lesion within the right ovary which  was not appreciated on the prior exam with significant surrounding increased vascularity highly suggestive of an ectopic pregnancy. This would correspond with the patient's given clinical history of recent ectopic pregnancy and methotrexate administration. No definitive fetal pole or yolk sac is noted at this time. Mild free fluid is noted in the pelvis adjacent to the right ovary although does not appear complex in nature. Uterine fibroids are again noted measuring up to 3.3 cm. IMPRESSION: No evidence of intrauterine gestation. There are changes consistent with an ectopic pregnancy in the right adnexa/ovary which are consistent with the patient's given clinical history. No fetal pole or yolk sac is noted at this time. Mild free fluid. Uterine fibroid stable from prior exams. Critical Value/emergent results were called by telephone at the time of interpretation on 01/02/2021 at 2:34 am to [facility], CNM , who verbally acknowledged these results. Electronically Signed   By: Inez Catalina M.D.   On: 01/02/2021 02:35   US OB LESS THAN 14 WEEKS WITH OB TRANSVAGINAL  Result Date: 12/23/2020 CLINICAL DATA:  Spotting, lower abdominal pain EXAM: OBSTETRIC <14 WK [place] AND TRANSVAGINAL OB US TECHNIQUE: Both transabdominal and transvaginal ultrasound examinations were performed for complete  evaluation of the gestation as well as the maternal uterus, adnexal regions, and pelvic cul-de-sac. Transvaginal technique was performed to assess early pregnancy. COMPARISON:  None. FINDINGS: Intrauterine gestational sac: Single Yolk sac:  Not Visualized. Embryo:  Not Visualized. Cardiac Activity: Not Visualized. Heart Rate:   bpm MSD: 3.3 mm   5 w   0 d CRL:    mm    w    d                  [place] EDC: Subchorionic hemorrhage:  None visualized. Maternal uterus/adnexae: No adnexal mass or free fluid IMPRESSION: Probable early intrauterine gestational sac, but no yolk sac, fetal pole, or cardiac activity yet visualized. Recommend follow-up quantitative B-HCG levels and follow-up US in 14 days to assess viability. This recommendation follows SRU consensus guidelines: Diagnostic Criteria for Nonviable Pregnancy Early in the First Trimester. [place] Med 2013; 782:4235-36. No acute maternal findings. Electronically Signed   By: Rolm Baptise M.D.   On: 12/23/2020 20:44    MAU Course: Orders Placed This Encounter  Procedures   US OB Transvaginal   CBC   Comprehensive metabolic panel   hCG, quantitative, pregnancy   Type and screen [place] ordered this encounter  Medications   DISCONTD: oxyCODONE-acetaminophen (PERCOCET/ROXICET) 5-325 MG per tablet 2 tablet   DISCONTD: oxyCODONE (Oxy IR/ROXICODONE) immediate release tablet 5 mg   oxyCODONE (Oxy IR/ROXICODONE) immediate release tablet 10 mg   methotrexate (for ectopic pregnancy) chemo injection 119 mg    Assessment/Plan: SHAWNY BORKOWSKI is a 40 y.o. female, G2P0100, IUP at 7.4 weeks weeks, presenting for 24 hours observation for known ectopic pregnancy. At [redacted]w[redacted]d here with complaints of abdominal pain. She denies vaginal bleeding, vaginal discharge, vomiting, diarrhea, fever, SOB. 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   6/30 Beta HCG 10,790. Blood type A-, rhogam given 6/30.   Dx with  ectopic on 7/7 Beta was 27167 and ultrasound showing a probable right ectopic pregnancy with no evidence of pregnancy in the uterine cavity. (Anteverted Uterus 6.28 x 5.23 x 7.29 cm; Endometrium 32.30 mm appears thickened and heterogeneous  Fibroids #1 3.50 cm Right posterior fundal intramural #2 2.60 cm Left posterior fundal intramural  L ovary 2.43cm WNL and left adnexa unremarkable  R ovary 2.45cm contains likely corpus luteum measuring 1.4 cm in right adnexa, adjacent to R ovary there appears to be a complex mass w/ ring of color measures 2.5 x 2.5 x 2.4cm--- there is a central cystic component that is likely gestation sac measuring 1.6 x 1.2 x 1.5 cm ( 6 weeks 1 days)--- sonographic appearance concerning for ectopic pregnancy given beta hcg  # fibroids: 3.5 cm and 2.60 cm Trace fluid culdesac)  7/7 at [facility] Discussed and recommended Methotrexate due to patient's history of DVT/PE and previous abdominal surgeries  (myomectomy and cholecystectomy).   7/8 Beta Hcg 35,804  7/10: pt presented to MAU for abdominal pain. Per MAU: Pt reports sharp shooting pain in lower pelvic area that started yesterday afternoon resolved and returned tonight with greater intensity. Resolved with percept in MAU. Also reports lower back pain. Reports single "gush"of ?red blood yesterday and today pink spotting. Pt received methotrexate Friday. Beta today was 35,897. US showed IMPRESSION: No evidence of intrauterine gestation. There are changes consistent with an ectopic pregnancy in the right adnexa/ovary which are consistent with the patient's given clinical history. No fetal pole or yolk sac is noted at this time. Mild free fluid. Uterine fibroid stable from prior exams.   Plan: Admit to Antepartum per consult with DR Delora Fuel for 24 hours observation for surgery versus observation.  NPO SCD Percocet for pain Repeat Beta HCG in morning.   DR Gerrit Friends to assess.   Noralyn Pick NP-C, CNM, MSN 01/02/2021, 5:41  AM

## 2021-01-02 NOTE — Transfer of Care (Addendum)
Immediate Anesthesia Transfer of Care Note  Patient: Sheri Robinson  Procedure(s) Performed: OPERATIVE LAPAROSCOPY WITH REMOVAL OF ECTOPIC PREGNANCY (Right)  Patient Location: PACU  Anesthesia Type:General  Level of Consciousness: awake, alert  and oriented  Airway & Oxygen Therapy: Patient Spontanous Breathing and Patient connected to nasal cannula oxygen  Post-op Assessment: Report given to RN and Post -op Vital signs reviewed and stable  Post vital signs: Reviewed and stable  Last Vitals:  Vitals Value Taken Time  BP 118/73 01/02/21 2134  Temp    Pulse 88 01/02/21 2135  Resp 18 01/02/21 2135  SpO2 100 % 01/02/21 2135  Vitals shown include unvalidated device data.  Last Pain:  Vitals:   01/02/21 1745  TempSrc: Oral  PainSc:       Patients Stated Pain Goal: 3 (79/72/82 0601)  Complications: No notable events documented.

## 2021-01-02 NOTE — Progress Notes (Signed)
Patient to main OR for procedure via stretcher with OR transport.

## 2021-01-02 NOTE — Anesthesia Procedure Notes (Signed)
Procedure Name: Intubation Date/Time: 01/02/2021 7:06 PM Performed by: Jearld Pies, CRNA Pre-anesthesia Checklist: Patient identified, Emergency Drugs available, Suction available and Patient being monitored Patient Re-evaluated:Patient Re-evaluated prior to induction Oxygen Delivery Method: Circle System Utilized Preoxygenation: Pre-oxygenation with 100% oxygen Induction Type: IV induction, Rapid sequence and Cricoid Pressure applied Laryngoscope Size: Mac and 3 Grade View: Grade I Tube type: Oral Tube size: 7.0 mm Number of attempts: 1 Airway Equipment and Method: Stylet and Oral airway Placement Confirmation: ETT inserted through vocal cords under direct vision, positive ETCO2 and breath sounds checked- equal and bilateral Secured at: 22 cm Tube secured with: Tape Dental Injury: Teeth and Oropharynx as per pre-operative assessment

## 2021-01-03 ENCOUNTER — Encounter (HOSPITAL_COMMUNITY): Payer: Self-pay | Admitting: Obstetrics and Gynecology

## 2021-01-03 ENCOUNTER — Other Ambulatory Visit: Payer: Self-pay

## 2021-01-03 LAB — CBC WITH DIFFERENTIAL/PLATELET
Abs Immature Granulocytes: 0.08 10*3/uL — ABNORMAL HIGH (ref 0.00–0.07)
Basophils Absolute: 0 10*3/uL (ref 0.0–0.1)
Basophils Relative: 0 %
Eosinophils Absolute: 0 10*3/uL (ref 0.0–0.5)
Eosinophils Relative: 0 %
HCT: 29.8 % — ABNORMAL LOW (ref 36.0–46.0)
Hemoglobin: 9.8 g/dL — ABNORMAL LOW (ref 12.0–15.0)
Immature Granulocytes: 1 %
Lymphocytes Relative: 6 %
Lymphs Abs: 0.6 10*3/uL — ABNORMAL LOW (ref 0.7–4.0)
MCH: 31.2 pg (ref 26.0–34.0)
MCHC: 32.9 g/dL (ref 30.0–36.0)
MCV: 94.9 fL (ref 80.0–100.0)
Monocytes Absolute: 0.2 10*3/uL (ref 0.1–1.0)
Monocytes Relative: 2 %
Neutro Abs: 9.9 10*3/uL — ABNORMAL HIGH (ref 1.7–7.7)
Neutrophils Relative %: 91 %
Platelets: 223 10*3/uL (ref 150–400)
RBC: 3.14 MIL/uL — ABNORMAL LOW (ref 3.87–5.11)
RDW: 14.8 % (ref 11.5–15.5)
WBC: 10.7 10*3/uL — ABNORMAL HIGH (ref 4.0–10.5)
nRBC: 0 % (ref 0.0–0.2)

## 2021-01-03 LAB — CBC
HCT: 31.7 % — ABNORMAL LOW (ref 36.0–46.0)
Hemoglobin: 9.9 g/dL — ABNORMAL LOW (ref 12.0–15.0)
MCH: 30.3 pg (ref 26.0–34.0)
MCHC: 31.2 g/dL (ref 30.0–36.0)
MCV: 96.9 fL (ref 80.0–100.0)
Platelets: 219 10*3/uL (ref 150–400)
RBC: 3.27 MIL/uL — ABNORMAL LOW (ref 3.87–5.11)
RDW: 15 % (ref 11.5–15.5)
WBC: 9.3 10*3/uL (ref 4.0–10.5)
nRBC: 0 % (ref 0.0–0.2)

## 2021-01-03 LAB — CREATININE, SERUM
Creatinine, Ser: 0.93 mg/dL (ref 0.44–1.00)
GFR, Estimated: >60 mL/min (ref >60)

## 2021-01-03 LAB — HCG, QUANTITATIVE, PREGNANCY: hCG, Beta Chain, Quant, S: 18413 m[IU]/mL — ABNORMAL HIGH (ref <5)

## 2021-01-03 MED ORDER — OXYCODONE HCL 10 MG PO TABS: 5.0000 mg | ORAL_TABLET | Freq: Four times a day (QID) | ORAL | 0 refills | Status: AC | PRN

## 2021-01-03 MED ORDER — HYDROMORPHONE HCL 1 MG/ML IJ SOLN
1.0000 mg | Freq: Once | INTRAMUSCULAR | Status: AC
Start: 2021-01-03 — End: 2021-01-03
  Administered 2021-01-03: 1 mg via INTRAVENOUS
  Filled 2021-01-03: qty 1

## 2021-01-03 MED ORDER — ACETAMINOPHEN 500 MG PO TABS: 1000.0000 mg | ORAL_TABLET | Freq: Four times a day (QID) | ORAL | 0 refills | Status: AC | PRN

## 2021-01-03 MED ORDER — ENOXAPARIN SODIUM 60 MG/0.6ML IJ SOSY: 60.0000 mg | PREFILLED_SYRINGE | INTRAMUSCULAR | 1 refills | Status: DC

## 2021-01-03 MED ORDER — IBUPROFEN 600 MG PO TABS
600.0000 mg | ORAL_TABLET | Freq: Four times a day (QID) | ORAL | 0 refills | Status: DC
Start: 2021-01-03 — End: 2021-04-22

## 2021-01-03 NOTE — Discharge Summary (Signed)
Laparoscopic Right Salpingectomy  Discharge Summary  Patient Name: Sheri Robinson DOB: 12-17-1980 MRN: [id number]  Date of admission: 01/02/2021 Intrauterine pregnancy: [redacted]w[redacted]d   Admitting diagnosis: Ectopic pregnancy without intrauterine pregnancy [O00.90] Ruptured ectopic pregnancy [O00.90] Secondary diagnosis: None  Date of discharge: 01/03/2021    Discharge diagnosis:  S/P laparoscopic right salpingectomy  Prenatal history: G2P0100   EDC : 08/17/2021, by Last Menstrual Period  Prenatal care at [facility]  Primary provider : [facility] Prenatal course complicated by Right Ectopic  Prenatal Labs: ABO, Rh: --/--/A NEG (07/10 0210) / Rhophylac 12/23/20 Antibody: POS (07/10 0210)                                   Hospital course: Right ectopic pregnancy.  Received methotrexate on 07/08. Presented to MAU for abd pain on 07/10. Lap right salpingectomy on 07/10 per Dr Delora Fuel. On lovenox for Hx of DVT/PE, will continue at discharge for 7-10 days. Beta Quant on 07/11 was 18,413. Pt to follow up with Dr Charlesetta Garibaldi  Labs: Lab Results  Component Value Date   WBC 10.7 (H) 01/03/2021   HGB 9.8 (L) 01/03/2021   HCT 29.8 (L) 01/03/2021   MCV 94.9 01/03/2021   PLT 223 01/03/2021   CMP Latest Ref Rng & Units 01/02/2021  Glucose 70 - 99 mg/dL -  BUN 6 - 20 mg/dL -  Creatinine 0.44 - 1.00 mg/dL 0.93  Sodium 135 - 145 mmol/L -  Potassium 3.5 - 5.1 mmol/L -  Chloride 98 - 111 mmol/L -  CO2 22 - 32 mmol/L -  Calcium 8.9 - 10.3 mg/dL -  Total Protein 6.5 - 8.1 g/dL -  Total Bilirubin 0.3 - 1.2 mg/dL -  Alkaline Phos 38 - 126 U/L -  AST 15 - 41 U/L -  ALT 0 - 44 U/L -    Physical Exam @ time of discharge:  Vitals:   01/02/21 2355 01/03/21 0338 01/03/21 0615 01/03/21 0722  BP: (!) 141/81 132/72  135/74  Pulse: 79 83 79 82  Resp:  18  18  Temp:  99.2 F (37.3 C)  98.3 F (36.8 C)  TempSrc:  Axillary  Oral  SpO2: 96% 96% 95% 96%  Weight:      Height:       Physical Exam Constitutional:       Appearance: Normal appearance. She is obese. HENT:    Head: Normocephalic and atraumatic.    Nose: Nose normal.    Mouth/Throat:    Mouth: Mucous membranes are moist. Eyes:    Conjunctiva/sclera: Conjunctivae normal. Cardiovascular:    Pulses: Normal pulses.    Heart sounds: Normal heart sounds. Pulmonary:    Effort: Pulmonary effort is normal.    Breath sounds: Normal breath sounds. Abdominal:    General: Bowel sounds are normal. There is no distension.    Palpations: There is no mass.    Tenderness: Generalized abd tenderness.      Genitourinary:    Comments: Deferred  Musculoskeletal:        General: Normal range of motion. Skin:    General: Skin is warm.    Capillary Refill: Capillary refill takes less than 2 seconds. Neurological:    General: No focal deficit present.    Mental Status: She is alert. Psychiatric:        Mood and Affect: Mood normal.  Discharge instructions: Lovenox 60 mg 7-10 days  PO Follow up with Dr Charlesetta Garibaldi in 1 week Call for DVT/PE s/s  Discharge Medications:  Allergies as of 01/03/2021       Reactions   Amoxicillin Anaphylaxis   Penicillins Anaphylaxis        Medication List     STOP taking these medications    amLODipine 10 MG tablet Commonly known as: NORVASC   cholestyramine 4 g packet Commonly known as: QUESTRAN   hydrOXYzine 50 MG tablet Commonly known as: ATARAX/VISTARIL   NIFEdipine 30 MG 24 hr tablet Commonly known as: PROCARDIA-XL/NIFEDICAL-XL   olmesartan 20 MG tablet Commonly known as: BENICAR   omeprazole 20 MG capsule Commonly known as: PRILOSEC   sertraline 25 MG tablet Commonly known as: ZOLOFT   valACYclovir 500 MG tablet Commonly known as: VALTREX       TAKE these medications    acetaminophen 500 MG tablet Commonly known as: TYLENOL Take 2 tablets (1,000 mg total) by mouth every 6 (six) hours as needed for mild pain, moderate pain or headache.   albuterol 108 (90 Base) MCG/ACT  inhaler Commonly known as: VENTOLIN HFA Inhale into the lungs every 6 (six) hours as needed for wheezing or shortness of breath.   Breo Ellipta 100-25 MCG/INH Aepb Generic drug: fluticasone furoate-vilanterol Inhale 1 puff into the lungs daily. Rinse mouth after each use   enoxaparin 60 MG/0.6ML injection Commonly known as: LOVENOX Inject 0.6 mLs (60 mg total) into the skin daily.   ibuprofen 600 MG tablet Commonly known as: ADVIL Take 1 tablet (600 mg total) by mouth every 6 (six) hours.   montelukast 10 MG tablet Commonly known as: SINGULAIR Take 10 mg by mouth at bedtime.   Oxycodone HCl 10 MG Tabs Take 0.5 tablets (5 mg total) by mouth every 6 (six) hours as needed for up to 3 days for severe pain.               Discharge Care Instructions  (From admission, onward)           Start     Ordered   01/03/21 0000  Discharge wound care:       Comments: D/C dressings before pt discharged.   01/03/21 1047           Diet: routine diet Activity: Advance as tolerated. Pelvic rest x 4 weeks.  Follow up:1 week call for appointment with Dr Charlesetta Garibaldi  Signed: Domingo Pulse MSN, CNM 01/03/2021, 10:48 AM

## 2021-01-03 NOTE — Progress Notes (Signed)
Patient feeling okay post-operatively. Previously reported mostly RLQ port site incisional pain which is now 5/10. The Dilaudid 1mg  IV helped. She has ordered food to eat. Has voided and ambulated x 3. Overall feeling relieved. Denies fevers, chills, chest pain, SOB, or N/V.  Appears well. Abdomen soft, non-distended, honeycomb dressings mildly saturated on the superficial RLQ port site, and the LUQ port site. Other port sites with skin glue atop and are clean/dry/intact. SCDs are both on and working.  Reviewed events of today's procedure. Prophylactic Lovenox ordered for 0900 today. We discussed that she will be discharged home with Lovenox for at least 7-10 days post-op. Quant HCG in process. CBC stable as documented below.  CBC Latest Ref Rng & Units 01/03/2021 01/02/2021 01/02/2021  WBC 4.0 - 10.5 K/uL 10.7(H) 9.3 7.9  Hemoglobin 12.0 - 15.0 g/dL 9.8(L) 9.9(L) 9.7(L)  Hematocrit 36.0 - 46.0 % 29.8(L) 31.7(L) 29.8(L)  Platelets 150 - 400 K/uL 223 219 219   Drema Dallas, DO

## 2021-01-03 NOTE — Plan of Care (Signed)

## 2021-01-03 NOTE — Progress Notes (Addendum)
D/C instructions completed at 1115, all questions answered, and pt verbalizes understanding of upcoming appointments, medications, and post-op recovery education. Lovenox injection education completed and pt verbalized process and feeling comfortable with giving self-injections. Dressing removed on upper left port site per verbal from midwife on call. Dressing on lower right port site clean, dry, and intact with scant drainage marked. Sites approximated.   Added- Pt is alert and oriented x4, states feeling somewhat drowsy from medication, however pt is ambulatory, and states pain tolerable.

## 2021-01-04 LAB — SURGICAL PATHOLOGY

## 2021-01-04 NOTE — Anesthesia Postprocedure Evaluation (Signed)
Anesthesia Post Note  Patient: QUORRA ROSENE  Procedure(s) Performed: OPERATIVE LAPAROSCOPY WITH REMOVAL OF ECTOPIC PREGNANCY (Right)     Patient location during evaluation: PACU Anesthesia Type: General Level of consciousness: awake and alert Pain management: pain level controlled Vital Signs Assessment: post-procedure vital signs reviewed and stable Respiratory status: spontaneous breathing, nonlabored ventilation, respiratory function stable and patient connected to nasal cannula oxygen Cardiovascular status: blood pressure returned to baseline and stable Postop Assessment: no apparent nausea or vomiting Anesthetic complications: no   No notable events documented.  Last Vitals:  Vitals:   01/03/21 0722 01/03/21 1114  BP: 135/74 126/65  Pulse: 82 73  Resp: 18 18  Temp: 36.8 C 36.9 C  SpO2: 96% 97%    Last Pain:  Vitals:   01/03/21 1115  TempSrc:   PainSc: 6                  Lamija Besse S

## 2021-01-07 ENCOUNTER — Other Ambulatory Visit: Payer: Self-pay

## 2021-01-07 ENCOUNTER — Inpatient Hospital Stay (HOSPITAL_COMMUNITY): Payer: 59

## 2021-01-07 ENCOUNTER — Encounter (HOSPITAL_COMMUNITY): Payer: Self-pay | Admitting: *Deleted

## 2021-01-07 ENCOUNTER — Observation Stay (HOSPITAL_COMMUNITY)
Admission: AD | Admit: 2021-01-07 | Discharge: 2021-01-08 | Disposition: A | Discharge status: Home / Self Care | Payer: 59 | Attending: Obstetrics and Gynecology | Admitting: Obstetrics and Gynecology

## 2021-01-07 DIAGNOSIS — G8918 Other acute postprocedural pain: Secondary | ICD-10-CM | POA: Diagnosis present

## 2021-01-07 DIAGNOSIS — D72829 Elevated white blood cell count, unspecified: Secondary | ICD-10-CM

## 2021-01-07 DIAGNOSIS — R1084 Generalized abdominal pain: Secondary | ICD-10-CM | POA: Insufficient documentation

## 2021-01-07 DIAGNOSIS — J45909 Unspecified asthma, uncomplicated: Secondary | ICD-10-CM | POA: Insufficient documentation

## 2021-01-07 DIAGNOSIS — Z20822 Contact with and (suspected) exposure to covid-19: Secondary | ICD-10-CM | POA: Diagnosis not present

## 2021-01-07 DIAGNOSIS — L7634 Postprocedural seroma of skin and subcutaneous tissue following other procedure: Secondary | ICD-10-CM | POA: Insufficient documentation

## 2021-01-07 DIAGNOSIS — R109 Unspecified abdominal pain: Secondary | ICD-10-CM | POA: Diagnosis present

## 2021-01-07 DIAGNOSIS — I10 Essential (primary) hypertension: Secondary | ICD-10-CM | POA: Diagnosis not present

## 2021-01-07 DIAGNOSIS — Z79899 Other long term (current) drug therapy: Secondary | ICD-10-CM | POA: Insufficient documentation

## 2021-01-07 LAB — CBC WITH DIFFERENTIAL/PLATELET
Abs Immature Granulocytes: 0.19 10*3/uL — ABNORMAL HIGH (ref 0.00–0.07)
Basophils Absolute: 0 10*3/uL (ref 0.0–0.1)
Basophils Relative: 0 %
Eosinophils Absolute: 0.1 10*3/uL (ref 0.0–0.5)
Eosinophils Relative: 1 %
HCT: 34.1 % — ABNORMAL LOW (ref 36.0–46.0)
Hemoglobin: 10.8 g/dL — ABNORMAL LOW (ref 12.0–15.0)
Immature Granulocytes: 2 %
Lymphocytes Relative: 13 %
Lymphs Abs: 1.6 10*3/uL (ref 0.7–4.0)
MCH: 30.4 pg (ref 26.0–34.0)
MCHC: 31.7 g/dL (ref 30.0–36.0)
MCV: 96.1 fL (ref 80.0–100.0)
Monocytes Absolute: 0.7 10*3/uL (ref 0.1–1.0)
Monocytes Relative: 6 %
Neutro Abs: 9.7 10*3/uL — ABNORMAL HIGH (ref 1.7–7.7)
Neutrophils Relative %: 78 %
Platelets: 290 10*3/uL (ref 150–400)
RBC: 3.55 MIL/uL — ABNORMAL LOW (ref 3.87–5.11)
RDW: 14.6 % (ref 11.5–15.5)
WBC: 12.3 10*3/uL — ABNORMAL HIGH (ref 4.0–10.5)
nRBC: 0.2 % (ref 0.0–0.2)

## 2021-01-07 LAB — COMPREHENSIVE METABOLIC PANEL
ALT: 17 U/L (ref 0–44)
AST: 16 U/L (ref 15–41)
Albumin: 3.6 g/dL (ref 3.5–5.0)
Alkaline Phosphatase: 47 U/L (ref 38–126)
Anion gap: 13 (ref 5–15)
BUN: <5 mg/dL — ABNORMAL LOW (ref 6–20)
CO2: 25 mmol/L (ref 22–32)
Calcium: 9.2 mg/dL (ref 8.9–10.3)
Chloride: 98 mmol/L (ref 98–111)
Creatinine, Ser: 0.85 mg/dL (ref 0.44–1.00)
GFR, Estimated: >60 mL/min (ref >60)
Glucose, Bld: 102 mg/dL — ABNORMAL HIGH (ref 70–99)
Potassium: 3.6 mmol/L (ref 3.5–5.1)
Sodium: 136 mmol/L (ref 135–145)
Total Bilirubin: 0.7 mg/dL (ref 0.3–1.2)
Total Protein: 7.5 g/dL (ref 6.5–8.1)

## 2021-01-07 LAB — URINALYSIS, ROUTINE W REFLEX MICROSCOPIC
Bilirubin Urine: NEGATIVE
Glucose, UA: NEGATIVE mg/dL
Ketones, ur: NEGATIVE mg/dL
Leukocytes,Ua: NEGATIVE
Nitrite: NEGATIVE
Protein, ur: NEGATIVE mg/dL
RBC / HPF: >50 RBC/hpf — ABNORMAL HIGH (ref 0–5)
Specific Gravity, Urine: 1.015 (ref 1.005–1.030)
pH: 6 (ref 5.0–8.0)

## 2021-01-07 LAB — RESP PANEL BY RT-PCR (FLU A&B, COVID) ARPGX2
Influenza A by PCR: NEGATIVE
Influenza B by PCR: NEGATIVE
SARS Coronavirus 2 by RT PCR: NEGATIVE

## 2021-01-07 MED ORDER — PRENATAL MULTIVITAMIN CH
1.0000 | ORAL_TABLET | Freq: Every day | ORAL | Status: DC
  Administered 2021-01-08: 1 via ORAL
  Filled 2021-01-07: qty 1

## 2021-01-07 MED ORDER — LACTATED RINGERS IV SOLN: INTRAVENOUS | Status: DC

## 2021-01-07 MED ORDER — IOHEXOL 300 MG/ML  SOLN
100.0000 mL | Freq: Once | INTRAMUSCULAR | Status: AC | PRN
  Administered 2021-01-07: 100 mL via INTRAVENOUS

## 2021-01-07 MED ORDER — FLUTICASONE FUROATE-VILANTEROL 100-25 MCG/INH IN AEPB
1.0000 | INHALATION_SPRAY | Freq: Every day | RESPIRATORY_TRACT | Status: DC
  Administered 2021-01-08: 1 via RESPIRATORY_TRACT
  Filled 2021-01-07: qty 28

## 2021-01-07 MED ORDER — ONDANSETRON HCL 4 MG PO TABS: 4.0000 mg | ORAL_TABLET | Freq: Four times a day (QID) | ORAL | Status: DC | PRN

## 2021-01-07 MED ORDER — KETOROLAC TROMETHAMINE 30 MG/ML IJ SOLN
30.0000 mg | Freq: Four times a day (QID) | INTRAMUSCULAR | Status: DC
  Administered 2021-01-07 – 2021-01-08 (×5): 30 mg via INTRAVENOUS
  Filled 2021-01-07 (×4): qty 1

## 2021-01-07 MED ORDER — SIMETHICONE 80 MG PO CHEW: 80.0000 mg | CHEWABLE_TABLET | Freq: Four times a day (QID) | ORAL | Status: DC | PRN

## 2021-01-07 MED ORDER — COVID-19 MRNA VAC-TRIS(PFIZER) 30 MCG/0.3ML IM SUSP
0.3000 mL | Freq: Once | INTRAMUSCULAR | Status: DC
  Filled 2021-01-07: qty 0.3

## 2021-01-07 MED ORDER — KETOROLAC TROMETHAMINE 30 MG/ML IJ SOLN
30.0000 mg | Freq: Four times a day (QID) | INTRAMUSCULAR | Status: DC
  Filled 2021-01-07: qty 1

## 2021-01-07 MED ORDER — HYDROMORPHONE HCL 1 MG/ML IJ SOLN
0.2000 mg | INTRAMUSCULAR | Status: DC | PRN
Start: 2021-01-07 — End: 2021-01-09

## 2021-01-07 MED ORDER — ALUM & MAG HYDROXIDE-SIMETH 200-200-20 MG/5ML PO SUSP: 30.0000 mL | ORAL | Status: DC | PRN

## 2021-01-07 MED ORDER — ENOXAPARIN SODIUM 60 MG/0.6ML IJ SOSY
60.0000 mg | PREFILLED_SYRINGE | INTRAMUSCULAR | Status: DC
  Administered 2021-01-07: 60 mg via SUBCUTANEOUS
  Filled 2021-01-07: qty 0.6

## 2021-01-07 MED ORDER — ALBUTEROL SULFATE (2.5 MG/3ML) 0.083% IN NEBU: 2.5000 mg | INHALATION_SOLUTION | RESPIRATORY_TRACT | Status: DC | PRN

## 2021-01-07 MED ORDER — TRAMADOL HCL 50 MG PO TABS
50.0000 mg | ORAL_TABLET | Freq: Four times a day (QID) | ORAL | Status: DC | PRN
  Administered 2021-01-08 (×4): 50 mg via ORAL
  Filled 2021-01-07 (×4): qty 1

## 2021-01-07 MED ORDER — ONDANSETRON HCL 4 MG/2ML IJ SOLN
4.0000 mg | Freq: Four times a day (QID) | INTRAMUSCULAR | Status: DC | PRN
  Administered 2021-01-08 (×2): 4 mg via INTRAVENOUS
  Filled 2021-01-07 (×2): qty 2

## 2021-01-07 MED ORDER — LEVOFLOXACIN IN D5W 750 MG/150ML IV SOLN
750.0000 mg | INTRAVENOUS | Status: DC
  Administered 2021-01-07 – 2021-01-08 (×2): 750 mg via INTRAVENOUS
  Filled 2021-01-07 (×3): qty 150

## 2021-01-07 MED ORDER — HYDROMORPHONE HCL 1 MG/ML IJ SOLN
1.0000 mg | INTRAMUSCULAR | Status: DC | PRN
  Administered 2021-01-07 (×2): 1 mg via INTRAVENOUS
  Filled 2021-01-07 (×2): qty 1

## 2021-01-07 MED ORDER — ONDANSETRON HCL 4 MG/2ML IJ SOLN
4.0000 mg | Freq: Once | INTRAMUSCULAR | Status: AC
  Administered 2021-01-07: 4 mg via INTRAVENOUS
  Filled 2021-01-07: qty 2

## 2021-01-07 MED ORDER — MONTELUKAST SODIUM 10 MG PO TABS
10.0000 mg | ORAL_TABLET | Freq: Every day | ORAL | Status: DC
  Administered 2021-01-07: 10 mg via ORAL
  Filled 2021-01-07 (×2): qty 1

## 2021-01-07 MED ORDER — DOCUSATE SODIUM 100 MG PO CAPS
100.0000 mg | ORAL_CAPSULE | Freq: Two times a day (BID) | ORAL | Status: DC
  Administered 2021-01-07 – 2021-01-08 (×2): 100 mg via ORAL
  Filled 2021-01-07 (×2): qty 1

## 2021-01-07 MED ORDER — ACETAMINOPHEN 325 MG PO TABS: 650.0000 mg | ORAL_TABLET | ORAL | Status: DC | PRN

## 2021-01-07 MED ORDER — METRONIDAZOLE 500 MG/100ML IV SOLN
500.0000 mg | Freq: Three times a day (TID) | INTRAVENOUS | Status: DC
  Administered 2021-01-08 (×3): 500 mg via INTRAVENOUS
  Filled 2021-01-07 (×5): qty 100

## 2021-01-07 NOTE — MAU Provider Note (Addendum)
History     CSN: [id number]  Arrival date and time: 01/07/21 1149   Event Date/Time   First Provider Initiated Contact with Patient 01/07/21 1231      Chief Complaint  Patient presents with   Postpartum Complications   40 y.o. [id number] 5 days s/p laparoscopic right salpingectomy for ectopic pregnancy presenting with abdominal pain. Reports onset of pain shortly after surgery but worsened yesterday. She was managing the pain with Oxycodone and Tylenol but it stopped helping yesterday. Pain is mostly RMQ and RLQ but also bilateral in her pelvis. Pain is constant and stabbing. Rates 10/10. Also having right sided back pain. Reports minimal VB. Had BM today, is passing gas. Denies fevers. Endorses nausea, had 1 episode of emesis yesterday.   OB History     Gravida  2   Para  1   Term  0   Preterm  1   AB  1   Living  0      SAB  0   IAB  0   Ectopic  1   Multiple  0   Live Births  0        Obstetric Comments  Fibroids caused pt to go into preterm labor causing preterm delivery.         Past Medical History:  Diagnosis Date   Anemia    Arthritis    Asthma    Community acquired pneumonia of left lung 12/26/2016   improving   Cough 12/26/2016   DVT (deep venous thrombosis) (HCC)    Eczema    GERD (gastroesophageal reflux disease)    Heart murmur    echo done 2014-[facility]- neg   Hypertension    Miscarriage    Miscarriage 2013   had fibroids- lost blood- 3 tranfusions- developed dvt, pe 2014   Peripheral vascular disease ([place]) 2014   pe, dvt  left leg   rx           Pneumonia    Pulmonary embolism ([place])     Past Surgical History:  Procedure Laterality Date   CHOLECYSTECTOMY N/A 04/05/2016   Procedure: LAPAROSCOPIC CHOLECYSTECTOMY WITH INTRAOPERATIVE CHOLANGIOGRAM;  Surgeon: Autumn Messing III, MD;  Location: [place];  Service: General;  Laterality: N/A;   IVC FILTER PLACEMENT ([place] HX)  2014   removed 5 2014   LAPAROSCOPY Right 01/02/2021    Procedure: OPERATIVE LAPAROSCOPY WITH REMOVAL OF ECTOPIC PREGNANCY;  Surgeon: Drema Dallas, DO;  Location: [place];  Service: Obstetrics;  Laterality: Right;   MYOMECTOMY      Family History  Problem Relation Age of Onset   Hypertension Mother    Diabetes Mother    Stroke Father    Allergic rhinitis Neg Hx    Angioedema Neg Hx    Asthma Neg Hx    Atopy Neg Hx    Eczema Neg Hx    Immunodeficiency Neg Hx    Urticaria Neg Hx     Social History   Tobacco Use   Smoking status: Never   Smokeless tobacco: Never  Vaping Use   Vaping Use: Never used  Substance Use Topics   Alcohol use: No    Alcohol/week: 0.0 standard drinks   Drug use: No    Allergies:  Allergies  Allergen Reactions   Amoxicillin Anaphylaxis   Penicillins Anaphylaxis    Medications Prior to Admission  Medication Sig Dispense Refill Last Dose   acetaminophen (TYLENOL) 500 MG tablet Take 2 tablets (1,000 mg total)  by mouth every 6 (six) hours as needed for mild pain, moderate pain or headache. 30 tablet 0 01/07/2021   albuterol (VENTOLIN HFA) 108 (90 Base) MCG/ACT inhaler Inhale into the lungs every 6 (six) hours as needed for wheezing or shortness of breath.   01/06/2021   enoxaparin (LOVENOX) 60 MG/0.6ML injection Inject 0.6 mLs (60 mg total) into the skin daily. 8 mL 1 01/06/2021   ibuprofen (ADVIL) 600 MG tablet Take 1 tablet (600 mg total) by mouth every 6 (six) hours. 30 tablet 0 01/06/2021   montelukast (SINGULAIR) 10 MG tablet Take 10 mg by mouth at bedtime.   01/06/2021   oxycodone-acetaminophen (PERCOCET) 2.5-325 MG tablet Take 1 tablet by mouth every 4 (four) hours as needed for pain.   01/06/2021   fluticasone furoate-vilanterol (BREO ELLIPTA) 100-25 MCG/INH AEPB Inhale 1 puff into the lungs daily. Rinse mouth after each use 60 each 3     Review of Systems  Constitutional:  Negative for chills and fever.  Gastrointestinal:  Positive for abdominal pain, nausea and vomiting. Negative for diarrhea.   Genitourinary:  Positive for vaginal bleeding.  Musculoskeletal:  Positive for back pain.  Physical Exam   Blood pressure (!) 111/59, pulse 76, temperature 98.8 F (37.1 C), resp. rate 18, height 5\' 7"  (1.702 m), weight 120.2 kg, last menstrual period 11/10/2020, SpO2 (P) 97 %, unknown if currently breastfeeding.  Physical Exam Constitutional:      General: She is in acute distress (mild).     Appearance: Normal appearance.  HENT:     Head: Normocephalic and atraumatic.  Cardiovascular:     Rate and Rhythm: Normal rate.  Pulmonary:     Effort: Pulmonary effort is normal. No respiratory distress.  Abdominal:     General: Bowel sounds are decreased. There is distension (mild).     Palpations: Abdomen is soft. There is no mass.     Tenderness: There is generalized abdominal tenderness. There is no guarding or rebound.     Hernia: No hernia is present.       Comments: Decreased BS upper quadrants Incision x4, well approximated, no erythema, edema, or drainage  Musculoskeletal:        General: Normal range of motion.     Cervical back: Normal range of motion.  Skin:    General: Skin is warm and dry.  Neurological:     General: No focal deficit present.     Mental Status: She is alert and oriented to person, place, and time.  Psychiatric:        Mood and Affect: Mood normal.        Behavior: Behavior normal.   Results for orders placed or performed during the hospital encounter of 01/07/21 (from the past 24 hour(s))  Urinalysis, Routine w reflex microscopic Urine, Clean Catch     Status: Abnormal   Collection Time: 01/07/21 12:34 PM  Result Value Ref Range   Color, Urine YELLOW YELLOW   APPearance CLEAR CLEAR   Specific Gravity, Urine 1.015 1.005 - 1.030   pH 6.0 5.0 - 8.0   Glucose, UA NEGATIVE NEGATIVE mg/dL   Hgb urine dipstick MODERATE (A) NEGATIVE   Bilirubin Urine NEGATIVE NEGATIVE   Ketones, ur NEGATIVE NEGATIVE mg/dL   Protein, ur NEGATIVE NEGATIVE mg/dL    Nitrite NEGATIVE NEGATIVE   Leukocytes,Ua NEGATIVE NEGATIVE   RBC / HPF >50 (H) 0 - 5 RBC/hpf   WBC, UA 0-5 0 - 5 WBC/hpf   Bacteria, UA RARE (A) NONE  SEEN   Squamous Epithelial / LPF 0-5 0 - 5   Mucus PRESENT   CBC with Differential/Platelet     Status: Abnormal   Collection Time: 01/07/21  1:35 PM  Result Value Ref Range   WBC 12.3 (H) 4.0 - 10.5 K/uL   RBC 3.55 (L) 3.87 - 5.11 MIL/uL   Hemoglobin 10.8 (L) 12.0 - 15.0 g/dL   HCT 34.1 (L) 36.0 - 46.0 %   MCV 96.1 80.0 - 100.0 fL   MCH 30.4 26.0 - 34.0 pg   MCHC 31.7 30.0 - 36.0 g/dL   RDW 14.6 11.5 - 15.5 %   Platelets 290 150 - 400 K/uL   nRBC 0.2 0.0 - 0.2 %   Neutrophils Relative % 78 %   Neutro Abs 9.7 (H) 1.7 - 7.7 K/uL   Lymphocytes Relative 13 %   Lymphs Abs 1.6 0.7 - 4.0 K/uL   Monocytes Relative 6 %   Monocytes Absolute 0.7 0.1 - 1.0 K/uL   Eosinophils Relative 1 %   Eosinophils Absolute 0.1 0.0 - 0.5 K/uL   Basophils Relative 0 %   Basophils Absolute 0.0 0.0 - 0.1 K/uL   Immature Granulocytes 2 %   Abs Immature Granulocytes 0.19 (H) 0.00 - 0.07 K/uL  Comprehensive metabolic panel     Status: Abnormal   Collection Time: 01/07/21  1:35 PM  Result Value Ref Range   Sodium 136 135 - 145 mmol/L   Potassium 3.6 3.5 - 5.1 mmol/L   Chloride 98 98 - 111 mmol/L   CO2 25 22 - 32 mmol/L   Glucose, Bld 102 (H) 70 - 99 mg/dL   BUN <5 (L) 6 - 20 mg/dL   Creatinine, Ser 0.85 0.44 - 1.00 mg/dL   Calcium 9.2 8.9 - 10.3 mg/dL   Total Protein 7.5 6.5 - 8.1 g/dL   Albumin 3.6 3.5 - 5.0 g/dL   AST 16 15 - 41 U/L   ALT 17 0 - 44 U/L   Alkaline Phosphatase 47 38 - 126 U/L   Total Bilirubin 0.7 0.3 - 1.2 mg/dL   GFR, Estimated >60 >60 mL/min   Anion gap 13 5 - 15   CT ABDOMEN PELVIS W CONTRAST  Result Date: 01/07/2021 CLINICAL DATA:  Abdominal pain and fever in a postoperative patient, surgery for ectopic on 07/10. EXAM: CT ABDOMEN AND PELVIS WITH CONTRAST TECHNIQUE: Multidetector CT imaging of the abdomen and pelvis was  performed using the standard protocol following bolus administration of intravenous contrast. CONTRAST:  150mL OMNIPAQUE IOHEXOL 300 MG/ML  SOLN COMPARISON:  Pelvic sonogram of January 02, 2021. FINDINGS: Lower chest: Lung bases with basilar atelectasis. Hepatobiliary: Hepatic steatosis with focal fat intensification along the fissure for false form ligament. Portal vein is patent. Hepatic veins are patent. Post cholecystectomy without substantial biliary duct dilation. Pancreas: Normal, without mass, inflammation or ductal dilatation. Spleen: Spleen top normal.  No focal lesion. Adrenals/Urinary Tract: Adrenal glands are normal. Symmetric renal enhancement. No hydronephrosis. Urinary bladder is under distended. Stomach/Bowel: No acute gastrointestinal process. The appendix is normal. Stool in the proximal colon without colonic distension. Vascular/Lymphatic: Normal caliber of the abdominal aorta. Smooth contour of the IVC. There is no gastrohepatic or hepatoduodenal ligament lymphadenopathy. No retroperitoneal or mesenteric lymphadenopathy. Scattered pelvic lymph nodes none with pathologic enlargement. Reproductive: Endometrial thickening and leiomyomata in the uterus. No adnexal mass. Trace amount of fluid in stranding about the RIGHT adnexa following salpingectomy for ectopic pregnancy. Other: Stranding in the body wall particularly along  the RIGHT hemiabdomen in the subcutaneous fat. Tract of mixed density predominantly fluid extends from what is presumed to be a port site for laparoscopic procedure. This shows no peripheral enhancement measuring approximately 4.2 x 2.3 cm greatest dimension in terms of fluid with stranding more extensively along the RIGHT flank. Also a defect in RIGHT lower quadrant abdominal wall musculature on image 63 of series 3 may be contiguous with this fluid and this area may represent the actual site of port insertion. Mild stranding about the umbilicus as well. Scattered locules of gas,  small amounts along the anterior surface of the abdomen adjacent to another potential site for laparoscopic access in the LEFT upper quadrant. Some signs of skin thickening or noted over the RIGHT lower quadrant as well overlying the subcutaneous fat stranding. Musculoskeletal: As above with muscular defect in the RIGHT lower quadrant. Edema in adjacent musculature as well. IMPRESSION: 1. Defect in abdominal wall musculature in the RIGHT lower quadrant with tract of fluid extending towards the skin surface, overlying skin thickening and subcutaneous stranding raising the question of wound infection and extensive cellulitis following recent laparoscopic surgery. 2. Defect in musculature just lateral to the linea alba, this could be a potential risk for development of hernia though may have fascial closure overlying the area. Suggest attention on follow-up. 3. Scattered locules of gas in the upper abdomen may relate to recent laparoscopic access. Areas about the umbilicus and in the LEFT upper quadrant are more typical for laparoscopic changes. 4. Endometrial thickening and leiomyomata in the uterus, small free fluid in the pelvis without intra-abdominal or pelvic abscess. 5. Hepatic steatosis. 6. Post cholecystectomy without substantial biliary duct dilation. Electronically Signed   By: Zetta Bills M.D.   On: 01/07/2021 16:53    MAU Course  Procedures LR Dilaudid Zofran  MDM Labs and CT ordered and reviewed. Consult with Dr. Rip Harbour, recommends admit for pain mngt and abx. 1720: Dr. Simona Huh notified of presentation, clinical findings, labs and imaging. Plan for admit.  Assessment and Plan   1. Post-operative pain   2. Leukocytosis, unspecified type   3. Postoperative seroma of subcutaneous tissue after non-dermatologic procedure    Admit to [facility] unit Mng[facility] per Dr. Hughie Closs, CNM 01/07/2021, 7:33 PM

## 2021-01-07 NOTE — H&P (Signed)
From MAU Provider:  40 y.o. G2P0110 5 days s/p laparoscopic right salpingectomy for ectopic pregnancy presenting with abdominal pain. Reports onset of pain shortly after surgery but worsened yesterday. She was managing the pain with Oxycodone and Tylenol but it stopped helping yesterday. Pain is mostly RMQ and RLQ but also bilateral in her pelvis. Pain is constant and stabbing. Rates 10/10. Also having right sided back pain. Reports minimal VB. Had BM today, is passing gas. Denies fevers. Endorses nausea, had 1 episode of emesis yesterday.   S: Pain treated in MAU w/ Dilaudid with minimal improvement , 10/10 to 6/10, and then Toradol w/ most improvement, 6/10 to 2/10. Pain is now a dull ache and does not radiate. Voiding w/o difficulty and tolerating clear liquids w/o N/V. Denies being febrile.   O: Vitals with BMI 01/07/2021 01/07/2021 01/07/2021  Height - - -  Weight - - -  BMI - - -  Systolic 564 332 -  Diastolic 56 59 -  Pulse 79 76 75   CBC    Component Value Date/Time   WBC 12.3 (H) 01/07/2021 1335   RBC 3.55 (L) 01/07/2021 1335   HGB 10.8 (L) 01/07/2021 1335   HGB 11.6 10/13/2020 1109   HCT 34.1 (L) 01/07/2021 1335   HCT 34.6 10/13/2020 1109   PLT 290 01/07/2021 1335   PLT 234 10/13/2020 1109   MCV 96.1 01/07/2021 1335   MCV 90 10/13/2020 1109   MCH 30.4 01/07/2021 1335   MCHC 31.7 01/07/2021 1335   RDW 14.6 01/07/2021 1335   RDW 13.5 10/13/2020 1109   LYMPHSABS 1.6 01/07/2021 1335   LYMPHSABS 2.1 10/13/2020 1109   MONOABS 0.7 01/07/2021 1335   EOSABS 0.1 01/07/2021 1335   EOSABS 0.1 10/13/2020 1109   BASOSABS 0.0 01/07/2021 1335   BASOSABS 0.0 10/13/2020 1109   CMP     Component Value Date/Time   NA 136 01/07/2021 1335   NA 139 12/29/2017 1519   K 3.6 01/07/2021 1335   CL 98 01/07/2021 1335   CO2 25 01/07/2021 1335   GLUCOSE 102 (H) 01/07/2021 1335   BUN <5 (L) 01/07/2021 1335   BUN 10 12/29/2017 1519   CREATININE 0.85 01/07/2021 1335   CREATININE 0.98  04/01/2016 1300   CALCIUM 9.2 01/07/2021 1335   PROT 7.5 01/07/2021 1335   PROT 7.2 12/29/2017 1519   ALBUMIN 3.6 01/07/2021 1335   ALBUMIN 4.3 12/29/2017 1519   AST 16 01/07/2021 1335   ALT 17 01/07/2021 1335   ALKPHOS 47 01/07/2021 1335   BILITOT 0.7 01/07/2021 1335   BILITOT <0.2 12/29/2017 1519   GFRNONAA >60 01/07/2021 1335   GFRNONAA 74 04/01/2016 1300   GFRAA 79 12/29/2017 1519   GFRAA 85 04/01/2016 1300   Phys Exam: Gen: AAO x3 CV: RRR Resp: unlabored GI: abd soft, non-distended, surgical sites x4 healing, no drainage Neg for rebound tenderness, negative for guarding, negative for tympany GU: scant vaginal bleeding, no clots Ext: trace edema to BLE, non-pitting, neg for tenderness, pain, or cords  A/P:  POD #5 from Laparoscopic right salpingectomy for ruptured ectopic s/p MTX  IV antibiotics Pain mngmt Clear liquids Rpt CBC in AM  CT of the abdomen: IMPRESSION: 1. Defect in abdominal wall musculature in the RIGHT lower quadrant with tract of fluid extending towards the skin surface, overlying skin thickening and subcutaneous stranding raising the question of wound infection and extensive cellulitis following recent laparoscopic surgery. 2. Defect in musculature just lateral to the linea alba, this  could be a potential risk for development of hernia though may have fascial closure overlying the area. Suggest attention on follow-up. 3. Scattered locules of gas in the upper abdomen may relate to recent laparoscopic access. Areas about the umbilicus and in the LEFT upper quadrant are more typical for laparoscopic changes. 4. Endometrial thickening and leiomyomata in the uterus, small free fluid in the pelvis without intra-abdominal or pelvic abscess. 5. Hepatic steatosis. 6. Post cholecystectomy without substantial biliary duct dilation.   Burman Foster, MSN, CNM 01/07/2021 6:37 PM

## 2021-01-07 NOTE — MAU Note (Signed)
Surgery on 7/10 for ruptured ectopic, tube and pregnancy removed. Dc'd on Monday.  Was doing ok then.  When she woke up yesterday morning, the pain was bad.  Is taking the medication and it is not helping. Pain is getting worse.

## 2021-01-07 NOTE — Plan of Care (Signed)

## 2021-01-07 NOTE — Progress Notes (Signed)
Pharmacy Antibiotic Note  Sheri Robinson is a 40 y.o. female admitted on 01/07/2021 with severe abdominal pain s/p lap salpingectomy for ectopic pregnancy on 7/10.  Pharmacy has been consulted for Levaquin dosing for intraabdominal infection vs cellulitis.  Plan: Levofloxacin 750mg  IV q24h  Height: 5\' 7"  (170.2 cm) Weight: 120.2 kg (264 lb 14.4 oz) IBW/kg (Calculated) : 61.6  Temp (24hrs), Avg:98.9 F (37.2 C), Min:98.8 F (37.1 C), Max:98.9 F (37.2 C)  Recent Labs  Lab 01/02/21 0210 01/02/21 1555 01/02/21 2323 01/03/21 0416 01/07/21 1335  WBC 8.3 7.9 9.3 10.7* 12.3*  CREATININE 0.98  --  0.93  --  0.85    Estimated Creatinine Clearance: 115.7 mL/min (by C-G formula based on SCr of 0.85 mg/dL).    Allergies  Allergen Reactions   Amoxicillin Anaphylaxis   Penicillins Anaphylaxis    Antimicrobials this admission: Metronidazole 500mg  IV q8h  7/15 >>    Microbiology results:  7/15 UCx:     Thank you for allowing pharmacy to be a part of this patient's care.  Vernie Ammons 01/07/2021 7:25 PM

## 2021-01-08 LAB — CBC WITH DIFFERENTIAL/PLATELET
Abs Immature Granulocytes: 0.1 10*3/uL — ABNORMAL HIGH (ref 0.00–0.07)
Basophils Absolute: 0 10*3/uL (ref 0.0–0.1)
Basophils Relative: 0 %
Eosinophils Absolute: 0.1 10*3/uL (ref 0.0–0.5)
Eosinophils Relative: 1 %
HCT: 29.4 % — ABNORMAL LOW (ref 36.0–46.0)
Hemoglobin: 9.4 g/dL — ABNORMAL LOW (ref 12.0–15.0)
Immature Granulocytes: 1 %
Lymphocytes Relative: 19 %
Lymphs Abs: 2.2 10*3/uL (ref 0.7–4.0)
MCH: 30.5 pg (ref 26.0–34.0)
MCHC: 32 g/dL (ref 30.0–36.0)
MCV: 95.5 fL (ref 80.0–100.0)
Monocytes Absolute: 0.7 10*3/uL (ref 0.1–1.0)
Monocytes Relative: 6 %
Neutro Abs: 8.4 10*3/uL — ABNORMAL HIGH (ref 1.7–7.7)
Neutrophils Relative %: 73 %
Platelets: 241 10*3/uL (ref 150–400)
RBC: 3.08 MIL/uL — ABNORMAL LOW (ref 3.87–5.11)
RDW: 14.8 % (ref 11.5–15.5)
WBC: 11.5 10*3/uL — ABNORMAL HIGH (ref 4.0–10.5)
nRBC: 0 % (ref 0.0–0.2)

## 2021-01-08 MED ORDER — METRONIDAZOLE 500 MG PO TABS: 500.0000 mg | ORAL_TABLET | Freq: Three times a day (TID) | ORAL | 0 refills | Status: AC

## 2021-01-08 MED ORDER — LEVOFLOXACIN 750 MG PO TABS: 750.0000 mg | ORAL_TABLET | Freq: Every day | ORAL | 0 refills | Status: AC

## 2021-01-08 MED ORDER — BUTALBITAL-APAP-CAFFEINE 50-325-40 MG PO TABS
2.0000 | ORAL_TABLET | ORAL | Status: DC | PRN
  Administered 2021-01-08: 2 via ORAL
  Filled 2021-01-08: qty 2

## 2021-01-08 NOTE — Discharge Summary (Signed)
Physician Discharge Summary  Patient ID: Sheri Robinson MRN: [id number] DOB/AGE: Sep 15, 1980 40 y.o.  Admit date: 01/07/2021 Discharge date: 01/08/2021  Admission: 40 y.o. G2P0110 6 days s/p laparoscopic right salpingectomy for ectopic pregnancy presenting with abdominal pain. Reports onset of pain shortly after surgery but worsened yesterday. She was managing the pain with Oxycodone and Tylenol but it stopped helping yesterday. Reports minimal VB. Had BM today, is passing gas. Denies fevers. Endorses nausea much improved.     Nausea and Pain has much improved. Voiding w/o difficulty and tolerating clear liquids w/o N/V.     Discharge Diagnoses:  Active Problems:   Postoperative abdominal pain   Discharged Condition: good  Hospital Course: CT complete in MAU. Admitted for Observation, pain and nausea treated. 24 hours of Levoquin and Flagyl complete. Remains afebrile. WBC 11.5.  Consults: None  Significant Diagnostic Studies: labs: See results flowsheet for labs obtained and radiology: CT scan:    Treatments: IV hydration, antibiotics: metronidazole and levoquin, and analgesia: toradol, tramadol, fioricet, dilaudid  Discharge Exam: Blood pressure 106/62, pulse 81, temperature 98.2 F (36.8 C), temperature source Oral, resp. rate 18, height 5\' 7"  (1.702 m), weight 120.2 kg, last menstrual period 11/10/2020, SpO2 99 %, unknown if currently breastfeeding. General appearance: alert, cooperative, appears stated age, and no distress Head: Normocephalic, without obvious abnormality, atraumatic Eyes: negative findings: conjunctivae and sclerae normal and pupils equal, round, reactive to light and accomodation Ears: normal TM's and external ear canals both ears Nose: Nares normal. Septum midline. Mucosa normal. No drainage or sinus tenderness. Throat: normal findings: lips normal without lesions Neck: supple, symmetrical, trachea midline Back: symmetric, no curvature. ROM normal. No CVA  tenderness. Resp: clear to auscultation bilaterally Cardio: regular rate and rhythm GI: soft, non-tender; bowel sounds normal; no masses,  no organomegaly Extremities: extremities normal, atraumatic, no cyanosis or edema Pulses: 2+ and symmetric wnl Skin: Skin color, texture, turgor normal. No rashes or lesions or normal Neurologic: Grossly normal Incision/Wound:  Disposition: Discharge disposition: 01-Home or Self Care       Discharge Instructions     Call MD for:  difficulty breathing, headache or visual disturbances   Complete by: As directed    Call MD for:  extreme fatigue   Complete by: As directed    Call MD for:  hives   Complete by: As directed    Call MD for:  persistant dizziness or light-headedness   Complete by: As directed    Call MD for:  persistant nausea and vomiting   Complete by: As directed    Call MD for:  redness, tenderness, or signs of infection (pain, swelling, redness, odor or green/yellow discharge around incision site)   Complete by: As directed    Call MD for:  severe uncontrolled pain   Complete by: As directed    Call MD for:  temperature >100.4   Complete by: As directed    Diet - low sodium heart healthy   Complete by: As directed    Discharge wound care:   Complete by: As directed    Keep clean and dry   Increase activity slowly   Complete by: As directed    No dressing needed   Complete by: As directed          Follow-up Information     Ob/[facility]yn, [place] Follow up in 1 week(s).   Specialty: Obstetrics and Gynecology Contact information: [address] [place] [address] [phone]  DC home with oral Levoquin and Flagyl x 5 days.  Warning s/s discussed  Signed: Marveen Reeks Crespin Forstrom 01/08/2021, 9:01 PM

## 2021-01-08 NOTE — Progress Notes (Signed)
Pt discharge instruction given. Pt being discharge home. Will Follow up with Obgyn on Monday.

## 2021-01-20 ENCOUNTER — Telehealth: Payer: Self-pay | Admitting: Oncology

## 2021-01-20 NOTE — Telephone Encounter (Signed)
Received a new hem referral from Dr. Charlesetta Garibaldi for Acute embolism and thrombosis of unspecified deep veins of unspecified lower extremity. [name] has been cld and scheduled to see Dr. Alen Blew on 8/16 at 11am. Pt aware to arrive 20 minutes early.

## 2021-02-08 ENCOUNTER — Inpatient Hospital Stay: Payer: 59 | Attending: Oncology | Admitting: Oncology

## 2021-02-08 ENCOUNTER — Other Ambulatory Visit: Payer: Self-pay

## 2021-02-08 VITALS — BP 133/81 | HR 83 | Temp 97.8°F | Resp 18 | Ht 67.0 in | Wt 261.1 lb

## 2021-02-08 DIAGNOSIS — Z87891 Personal history of nicotine dependence: Secondary | ICD-10-CM

## 2021-02-08 DIAGNOSIS — I8290 Acute embolism and thrombosis of unspecified vein: Secondary | ICD-10-CM

## 2021-02-08 DIAGNOSIS — Z7901 Long term (current) use of anticoagulants: Secondary | ICD-10-CM | POA: Diagnosis not present

## 2021-02-08 DIAGNOSIS — Z86718 Personal history of other venous thrombosis and embolism: Secondary | ICD-10-CM | POA: Diagnosis present

## 2021-02-08 DIAGNOSIS — Z86711 Personal history of pulmonary embolism: Secondary | ICD-10-CM | POA: Diagnosis not present

## 2021-02-08 NOTE — Progress Notes (Signed)
Reason for the request:    Venous thromboembolism.  HPI: I was asked by Dr. 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  She is status post a recent hospitalization for an ectopic Pregnancy and ruptured right fallopian tube.  She underwent laparoscopic right salpingectomy on January 02, 2021.  She was readmitted for abdominal pain and was discharged on January 08, 2021. She is currently on Lovenox 60 mg daily for prophylaxis purposes and currently completing close to 6 weeks of therapy.  She reports her history of venous thromboembolism dating back to 2014 while she was pregnant and was diagnosed with uterine fibroid.  She was living in [place] at that time and the details of her illness is not available.  It was presumed that her thrombosis was related to venous compression.  She was treated with IVC filter, Lovenox and subsequently Xarelto for close to 8 months.  She has not had any thrombosis episodes since that time.  She did not have any postoperative DVT prophylaxis after gallbladder surgery.  Clinically, she feels well at this time.  She denies any recent bleeding issues.  She does have some pain associated with Lovenox injections.  She is ambulatory and has resumed all duties of daily living.  She does not report any headaches, blurry vision, syncope or seizures. Does not report any fevers, chills or sweats.  Does not report any cough, wheezing or hemoptysis.  Does not report any chest pain, palpitation, orthopnea or leg edema.  Does not report any nausea, vomiting or abdominal pain.  Does not report any constipation or diarrhea.  Does not report any skeletal complaints.    Does not report frequency, urgency or hematuria.  Does not report any skin rashes or lesions. Does not report any heat or cold intolerance.  Does not report any lymphadenopathy or petechiae.  Does not report any anxiety or depression.  Remaining review of  systems is negative.     Past Medical History:  Diagnosis Date   Anemia    Arthritis    Asthma    Community acquired pneumonia of left lung 12/26/2016   improving   Cough 12/26/2016   DVT (deep venous thrombosis) (HCC)    Eczema    GERD (gastroesophageal reflux disease)    Heart murmur    echo done 2014-[facility]- neg   Hypertension    Miscarriage    Miscarriage 2013   had fibroids- lost blood- 3 tranfusions- developed dvt, pe 2014   Peripheral vascular disease ([place]) 2014   pe, dvt  left leg   rx           Pneumonia    Pulmonary embolism ([place])   :   Past Surgical History:  Procedure Laterality Date   CHOLECYSTECTOMY N/A 04/05/2016   Procedure: LAPAROSCOPIC CHOLECYSTECTOMY WITH INTRAOPERATIVE CHOLANGIOGRAM;  Surgeon: Autumn Messing III, MD;  Location: [facility];  Service: General;  Laterality: N/A;   IVC FILTER PLACEMENT ([place] HX)  2014   removed 5 2014   LAPAROSCOPY Right 01/02/2021   Procedure: OPERATIVE LAPAROSCOPY WITH REMOVAL OF ECTOPIC PREGNANCY;  Surgeon: Drema Dallas, DO;  Location: [facility];  Service: Obstetrics;  Laterality: Right;   MYOMECTOMY    :   Current Outpatient Medications:    acetaminophen (TYLENOL) 500 MG tablet, Take 2 tablets (1,000 mg total) by mouth every 6 (six) hours as needed for mild pain, moderate pain or headache., Disp: 30 tablet, Rfl: 0  albuterol (VENTOLIN HFA) 108 (90 Base) MCG/ACT inhaler, Inhale into the lungs every 6 (six) hours as needed for wheezing or shortness of breath., Disp: , Rfl:    enoxaparin (LOVENOX) 60 MG/0.6ML injection, Inject 0.6 mLs (60 mg total) into the skin daily., Disp: 8 mL, Rfl: 1   ibuprofen (ADVIL) 600 MG tablet, Take 1 tablet (600 mg total) by mouth every 6 (six) hours., Disp: 30 tablet, Rfl: 0   oxycodone-acetaminophen (PERCOCET) 2.5-325 MG tablet, Take 1 tablet by mouth every 4 (four) hours as needed for pain., Disp: , Rfl: :   Allergies  Allergen Reactions   Amoxicillin Anaphylaxis   Penicillins  Anaphylaxis  :   Family History  Problem Relation Age of Onset   Hypertension Mother    Diabetes Mother    Stroke Father    Allergic rhinitis Neg Hx    Angioedema Neg Hx    Asthma Neg Hx    Atopy Neg Hx    Eczema Neg Hx    Immunodeficiency Neg Hx    Urticaria Neg Hx   :   Social History   Socioeconomic History   Marital status: Single    Spouse name: Not on file   Number of children: Not on file   Years of education: Not on file   Highest education level: Not on file  Occupational History   Occupation: Customer Service  Tobacco Use   Smoking status: Never   Smokeless tobacco: Never  Vaping Use   Vaping Use: Never used  Substance and Sexual Activity   Alcohol use: No    Alcohol/week: 0.0 standard drinks   Drug use: No   Sexual activity: Yes  Other Topics Concern   Not on file  Social History Narrative   Not on file   Social Determinants of Health   Financial Resource Strain: Not on file  Food Insecurity: Not on file  Transportation Needs: Not on file  Physical Activity: Not on file  Stress: Not on file  Social Connections: Not on file  Intimate Partner Violence: Not on file  :  Pertinent items are noted in HPI.  Exam: Blood pressure 133/81, pulse 83, temperature 97.8 F (36.6 C), temperature source Oral, resp. rate 18, height '5\' 7"'$  (1.702 m), weight 261 lb 1.6 oz (118.4 kg), SpO2 98 %, unknown if currently breastfeeding. ECOG 0 General appearance: alert and cooperative appeared without distress. Head: atraumatic without any abnormalities. Eyes: conjunctivae/corneas clear. PERRL.  Sclera anicteric. Throat: lips, mucosa, and tongue normal; without oral thrush or ulcers. Resp: clear to auscultation bilaterally without rhonchi, wheezes or dullness to percussion. Cardio: regular rate and rhythm, S1, S2 normal, no murmur, click, rub or gallop GI: soft, non-tender; bowel sounds normal; no masses,  no organomegaly Skin: Skin color, texture, turgor normal. No  rashes or lesions Lymph nodes: Cervical, supraclavicular, and axillary nodes normal. Neurologic: Grossly normal without any motor, sensory or deep tendon reflexes. Musculoskeletal: No joint deformity or effusion.     Assessment and Plan:    40 year old woman with:  1.  History of deep vein thrombosis and pulmonary embolism without any recent acute episodes.  Her venous thromboembolism dating back to 2014 while she was pregnant.  She does not have any family history or indication of an inherited thrombophilia.  The natural course of this disease was reviewed at this time and management options were discussed.  The rationale for using postoperative anticoagulation was reviewed and the duration was reiterated.    I recommended prophylactic Lovenox  dosing for 6 weeks postoperatively.  She is getting close to complete this time duration and I recommend discontinuing at this time.  Restarting Lovenox may be needed if she becomes pregnant in the future or if she develops acute deep vein thrombosis.  The nature of her venous thromboembolism appears to be related to mechanical obstruction rather than inherited thrombophilia.  2.  Follow-up: I'm Happy to see him in future as needed.   45  minutes were dedicated to this visit. The time was spent on reviewing laboratory data, imaging studies, discussing treatment options, discussing differential diagnosis and answering questions regarding future plan.     A copy of this consult has been forwarded to the requesting physician.

## 2021-04-22 ENCOUNTER — Inpatient Hospital Stay (HOSPITAL_COMMUNITY)
Admission: AD | Admit: 2021-04-22 | Discharge: 2021-04-22 | Disposition: A | Discharge status: Home / Self Care | Payer: Medicaid Other | Attending: Obstetrics & Gynecology | Admitting: Obstetrics & Gynecology

## 2021-04-22 ENCOUNTER — Encounter (HOSPITAL_COMMUNITY): Payer: Self-pay | Admitting: Obstetrics & Gynecology

## 2021-04-22 ENCOUNTER — Inpatient Hospital Stay (HOSPITAL_COMMUNITY): Payer: Medicaid Other

## 2021-04-22 ENCOUNTER — Other Ambulatory Visit: Payer: Self-pay

## 2021-04-22 DIAGNOSIS — M549 Dorsalgia, unspecified: Secondary | ICD-10-CM | POA: Diagnosis not present

## 2021-04-22 DIAGNOSIS — Z7901 Long term (current) use of anticoagulants: Secondary | ICD-10-CM | POA: Insufficient documentation

## 2021-04-22 DIAGNOSIS — Z8249 Family history of ischemic heart disease and other diseases of the circulatory system: Secondary | ICD-10-CM | POA: Insufficient documentation

## 2021-04-22 DIAGNOSIS — R109 Unspecified abdominal pain: Secondary | ICD-10-CM | POA: Insufficient documentation

## 2021-04-22 DIAGNOSIS — O26891 Other specified pregnancy related conditions, first trimester: Secondary | ICD-10-CM | POA: Diagnosis not present

## 2021-04-22 DIAGNOSIS — R1013 Epigastric pain: Secondary | ICD-10-CM | POA: Diagnosis not present

## 2021-04-22 DIAGNOSIS — Z9049 Acquired absence of other specified parts of digestive tract: Secondary | ICD-10-CM | POA: Insufficient documentation

## 2021-04-22 DIAGNOSIS — Z3A01 Less than 8 weeks gestation of pregnancy: Secondary | ICD-10-CM | POA: Diagnosis not present

## 2021-04-22 DIAGNOSIS — Z3491 Encounter for supervision of normal pregnancy, unspecified, first trimester: Secondary | ICD-10-CM

## 2021-04-22 DIAGNOSIS — Z880 Allergy status to penicillin: Secondary | ICD-10-CM | POA: Diagnosis not present

## 2021-04-22 DIAGNOSIS — Z8759 Personal history of other complications of pregnancy, childbirth and the puerperium: Secondary | ICD-10-CM | POA: Insufficient documentation

## 2021-04-22 DIAGNOSIS — O99891 Other specified diseases and conditions complicating pregnancy: Secondary | ICD-10-CM | POA: Insufficient documentation

## 2021-04-22 LAB — URINALYSIS, COMPLETE (UACMP) WITH MICROSCOPIC
Bilirubin Urine: NEGATIVE
Glucose, UA: NEGATIVE mg/dL
Hgb urine dipstick: NEGATIVE
Ketones, ur: NEGATIVE mg/dL
Leukocytes,Ua: NEGATIVE
Nitrite: NEGATIVE
Protein, ur: NEGATIVE mg/dL
Specific Gravity, Urine: 1.018 (ref 1.005–1.030)
pH: 5 (ref 5.0–8.0)

## 2021-04-22 LAB — WET PREP, GENITAL
Sperm: NONE SEEN
Trich, Wet Prep: NONE SEEN
Yeast Wet Prep HPF POC: NONE SEEN

## 2021-04-22 LAB — HCG, QUANTITATIVE, PREGNANCY: hCG, Beta Chain, Quant, S: 35199 m[IU]/mL — ABNORMAL HIGH (ref <5)

## 2021-04-22 LAB — POCT PREGNANCY, URINE: Preg Test, Ur: POSITIVE — AB

## 2021-04-22 LAB — CBC
HCT: 31.2 % — ABNORMAL LOW (ref 36.0–46.0)
Hemoglobin: 10.1 g/dL — ABNORMAL LOW (ref 12.0–15.0)
MCH: 29.7 pg (ref 26.0–34.0)
MCHC: 32.4 g/dL (ref 30.0–36.0)
MCV: 91.8 fL (ref 80.0–100.0)
Platelets: 253 10*3/uL (ref 150–400)
RBC: 3.4 MIL/uL — ABNORMAL LOW (ref 3.87–5.11)
RDW: 14 % (ref 11.5–15.5)
WBC: 9.2 10*3/uL (ref 4.0–10.5)
nRBC: 0 % (ref 0.0–0.2)

## 2021-04-22 LAB — COMPREHENSIVE METABOLIC PANEL
ALT: 12 U/L (ref 0–44)
AST: 13 U/L — ABNORMAL LOW (ref 15–41)
Albumin: 3.1 g/dL — ABNORMAL LOW (ref 3.5–5.0)
Alkaline Phosphatase: 42 U/L (ref 38–126)
Anion gap: 9 (ref 5–15)
BUN: 7 mg/dL (ref 6–20)
CO2: 24 mmol/L (ref 22–32)
Calcium: 8.7 mg/dL — ABNORMAL LOW (ref 8.9–10.3)
Chloride: 102 mmol/L (ref 98–111)
Creatinine, Ser: 0.9 mg/dL (ref 0.44–1.00)
GFR, Estimated: >60 mL/min (ref >60)
Glucose, Bld: 91 mg/dL (ref 70–99)
Potassium: 3.7 mmol/L (ref 3.5–5.1)
Sodium: 135 mmol/L (ref 135–145)
Total Bilirubin: 0.6 mg/dL (ref 0.3–1.2)
Total Protein: 6.6 g/dL (ref 6.5–8.1)

## 2021-04-22 MED ORDER — CYCLOBENZAPRINE HCL 10 MG PO TABS: 10.0000 mg | ORAL_TABLET | Freq: Two times a day (BID) | ORAL | 1 refills | Status: DC | PRN

## 2021-04-22 MED ORDER — ONDANSETRON 8 MG PO TBDP: 8.0000 mg | ORAL_TABLET | Freq: Three times a day (TID) | ORAL | 0 refills | Status: AC | PRN

## 2021-04-22 NOTE — MAU Provider Note (Signed)
Patient Sheri Robinson is a 40 y.o. G3P0110 at [redacted]w[redacted]d here with complaints of abdominal pain since "Monday or Tuesday"  And her back started hurting on Wednesday (Today is Friday). She had one episode of brief spotting on Wednesday but no other bleeding. She denies abnormal discharge, no pain with urination, no pain with intercourse. She has a history of ruptured right ectopic pregnancy in July, 2022; had right salpingectomy. She also known uterine fibroids.   She has a history of sciatica; she has had treatment for this past (spinal injections). She has a history of hypertension, Dvt as well.  History     CSN: [id number]  Arrival date and time: 04/22/21 1137   Event Date/Time   First Provider Initiated Contact with Patient 04/22/21 1409      Chief Complaint  Patient presents with   Abdominal Pain   Back Pain   Back Pain This is a new problem. The current episode started in the past 7 days. The problem occurs constantly. Pain location: left and right side of her back. Quality: throbbing. The pain is at a severity of 10/10. Associated symptoms include abdominal pain. Pertinent negatives include no dysuria.  Abdominal Pain This is a new problem. The current episode started in the past 7 days. The problem occurs constantly. The pain is located in the epigastric region. The pain is at a severity of 5/10. The quality of the pain is cramping. Pertinent negatives include no diarrhea or dysuria.   OB History     Gravida  3   Para  1   Term  0   Preterm  1   AB  1   Living  0      SAB  0   IAB  0   Ectopic  1   Multiple  0   Live Births  0        Obstetric Comments  Fibroids caused pt to go into preterm labor causing preterm delivery.         Past Medical History:  Diagnosis Date   Anemia    Arthritis    Asthma    Community acquired pneumonia of left lung 12/26/2016   improving   Cough 12/26/2016   DVT (deep venous thrombosis) (HCC)    Eczema    GERD  (gastroesophageal reflux disease)    Heart murmur    echo done 2014-[facility]- neg   Hypertension    Miscarriage    Miscarriage 2013   had fibroids- lost blood- 3 tranfusions- developed dvt, pe 2014   Peripheral vascular disease ([place]) 2014   pe, dvt  left leg   rx           Pneumonia    Pulmonary embolism ([place])     Past Surgical History:  Procedure Laterality Date   CHOLECYSTECTOMY N/A 04/05/2016   Procedure: LAPAROSCOPIC CHOLECYSTECTOMY WITH INTRAOPERATIVE CHOLANGIOGRAM;  Surgeon: Autumn Messing III, MD;  Location: [facility];  Service: General;  Laterality: N/A;   IVC FILTER PLACEMENT ([place] HX)  2014   removed 5 2014   LAPAROSCOPY Right 01/02/2021   Procedure: OPERATIVE LAPAROSCOPY WITH REMOVAL OF ECTOPIC PREGNANCY;  Surgeon: Drema Dallas, DO;  Location: [facility];  Service: Obstetrics;  Laterality: Right;   MYOMECTOMY      Family History  Problem Relation Age of Onset   Hypertension Mother    Diabetes Mother    Stroke Father    Allergic rhinitis Neg Hx    Angioedema Neg Hx  Asthma Neg Hx    Atopy Neg Hx    Eczema Neg Hx    Immunodeficiency Neg Hx    Urticaria Neg Hx     Social History   Tobacco Use   Smoking status: Never   Smokeless tobacco: Never  Vaping Use   Vaping Use: Never used  Substance Use Topics   Alcohol use: No    Alcohol/week: 0.0 standard drinks   Drug use: No    Allergies:  Allergies  Allergen Reactions   Amoxicillin Anaphylaxis   Penicillins Anaphylaxis    Medications Prior to Admission  Medication Sig Dispense Refill Last Dose   acetaminophen (TYLENOL) 500 MG tablet Take 2 tablets (1,000 mg total) by mouth every 6 (six) hours as needed for mild pain, moderate pain or headache. 30 tablet 0 04/21/2021 at 1800   albuterol (VENTOLIN HFA) 108 (90 Base) MCG/ACT inhaler Inhale into the lungs every 6 (six) hours as needed for wheezing or shortness of breath.   Past Month   montelukast (SINGULAIR) 10 MG tablet Take 10 mg by mouth at  bedtime.   04/22/2021 at 0800   NIFEdipine (PROCARDIA-XL/NIFEDICAL-XL) 30 MG 24 hr tablet Take 30 mg by mouth daily.   04/22/2021 at 0800   omeprazole (PRILOSEC) 20 MG capsule Take 20 mg by mouth daily.   04/22/2021 at 0800   valACYclovir (VALTREX) 1000 MG tablet Take 1,000 mg by mouth 2 (two) times daily.   04/22/2021   Vitamin D, Ergocalciferol, (DRISDOL) 1.25 MG (50000 UNIT) CAPS capsule Take 50,000 Units by mouth every 7 (seven) days.   04/21/2021   enoxaparin (LOVENOX) 60 MG/0.6ML injection Inject 0.6 mLs (60 mg total) into the skin daily. 8 mL 1    ibuprofen (ADVIL) 600 MG tablet Take 1 tablet (600 mg total) by mouth every 6 (six) hours. 30 tablet 0    oxycodone-acetaminophen (PERCOCET) 2.5-325 MG tablet Take 1 tablet by mouth every 4 (four) hours as needed for pain.       Review of Systems  Constitutional: Negative.   HENT: Negative.    Gastrointestinal:  Positive for abdominal pain. Negative for diarrhea.  Genitourinary:  Negative for dysuria and vaginal bleeding.  Musculoskeletal:  Positive for back pain.  Neurological: Negative.   Physical Exam   Blood pressure 129/66, pulse 85, temperature 97.6 F (36.4 C), temperature source Oral, resp. rate 18, height 5\' 7"  (1.702 m), weight 121.1 kg, last menstrual period 03/08/2021, SpO2 98 %, unknown if currently breastfeeding.  Physical Exam HENT:     Head: Normocephalic.  Abdominal:     General: Abdomen is flat.     Palpations: Abdomen is soft.     Tenderness: There is no abdominal tenderness.  Skin:    General: Skin is warm and dry.  Neurological:     General: No focal deficit present.     Mental Status: She is alert.  Psychiatric:        Mood and Affect: Mood normal.  Negative CVA tenderness  MAU Course  Procedures  MDM Complete ectopic work-up done.  US shows embryo with cardiac activity. I have independently reviewed the [place] images, which reveal finding of IUP with cardiac activity.   Wet prep shows clue cells but  patient denies any other complaints and declines treatment  UA is negative for signs of infection  Assessment and Plan   1. Viable pregnancy in first trimester   2. Abdominal pain    -keep follow up appt at [facility] in November -first trimester  precautions given -RX for flexeril given -discussed mechanics of body changes during pregnancy, discussed doing prenatal yoga, walking daily, core strengthening, maternity support pad   Mervyn Skeeters Tesla Keeler 04/22/2021, 2:55 PM

## 2021-04-22 NOTE — MAU Note (Addendum)
Presents with c/o fatigue, abdominal pain and lower back pain that began a few days ago.  Reports had spotting 2 days ago, none currently.  LMP 03/08/2021.  +HPT.

## 2021-04-22 NOTE — Progress Notes (Signed)
GC/Chlamydia and wet prep cultures obtained via pt self swabbing.

## 2021-04-25 LAB — GC/CHLAMYDIA PROBE AMP (~~LOC~~) NOT AT ARMC
Chlamydia: NEGATIVE
Comment: NEGATIVE
Comment: NORMAL
Neisseria Gonorrhea: NEGATIVE

## 2021-06-09 ENCOUNTER — Encounter (HOSPITAL_COMMUNITY): Payer: Self-pay | Admitting: Obstetrics & Gynecology

## 2021-06-09 ENCOUNTER — Other Ambulatory Visit: Payer: Self-pay | Admitting: Obstetrics & Gynecology

## 2021-06-09 ENCOUNTER — Other Ambulatory Visit: Payer: Self-pay

## 2021-06-09 NOTE — Progress Notes (Signed)
Spoke with pt for pre-op call. Pt is treated for HTN. Pt states she has been told she has a heart murmur but it has never caused any problems. Pt is not diabetic.   Pt's surgery is scheduled as ambulatory so no Covid test is required prior to surgery.

## 2021-06-09 NOTE — H&P (Signed)
Sheri Robinson is an 40 y.o. female with retained products of conception here for a suction dilation and curettage.  She has a history of a missed abortion at [redacted] weeks EGA and used vaginal cytotec for its management about 3 weeks ago.  She has continued with intermittent vaginal bleeding and abdominal cramping. An ultrasound done yesterday showed retained products of conception and she desires surgical management of this.   Pertinent Gynecological History: Menses: Regular Bleeding: Present Contraception: none DES exposure: unknown Blood transfusions:  With a history of.  Sexually transmitted diseases: no past history Previous GYN Procedures:  As listed below.    Last mammogram: normal Date: 09/17/20 Last pap: normal Date: 09/15/20 OB History: G3, P0120   Menstrual History: Patient's last menstrual period was 03/08/2021.    Past Medical History:  Diagnosis Date   Anemia    Anxiety    Arthritis    Asthma    Community acquired pneumonia of left lung 12/26/2016   improving   Cough 12/26/2016   COVID 12/2020   mild   DVT (deep venous thrombosis) (HCC)    Eczema    GERD (gastroesophageal reflux disease)    Heart murmur    echo done 2014-[facility]- neg   Hypertension    Miscarriage    Miscarriage 2013   had fibroids- lost blood- 3 tranfusions- developed dvt, pe 2014   Peripheral vascular disease ([place]) 2014   pe, dvt  left leg   rx           Pneumonia    Pulmonary embolism ([place])     Past Surgical History:  Procedure Laterality Date   CHOLECYSTECTOMY N/A 04/05/2016   Procedure: LAPAROSCOPIC CHOLECYSTECTOMY WITH INTRAOPERATIVE CHOLANGIOGRAM;  Surgeon: Autumn Messing III, MD;  Location: [facility];  Service: General;  Laterality: N/A;   IVC FILTER PLACEMENT ([place] HX)  2014   removed 5 2014   LAPAROSCOPY Right 01/02/2021   Procedure: OPERATIVE LAPAROSCOPY WITH REMOVAL OF ECTOPIC PREGNANCY;  Surgeon: Drema Dallas, DO;  Location: [facility];  Service: Obstetrics;  Laterality:  Right;   MYOMECTOMY      Family History  Problem Relation Age of Onset   Hypertension Mother    Diabetes Mother    Stroke Father    Allergic rhinitis Neg Hx    Angioedema Neg Hx    Asthma Neg Hx    Atopy Neg Hx    Eczema Neg Hx    Immunodeficiency Neg Hx    Urticaria Neg Hx     Social History:  reports that she has never smoked. She has never used smokeless tobacco. She reports that she does not drink alcohol and does not use drugs.  Allergies:  Allergies  Allergen Reactions   Amoxicillin Anaphylaxis   Penicillins Anaphylaxis    Current Outpatient Medications  Medication Instructions   acetaminophen (TYLENOL) 1,000 mg, Oral, Every 6 hours PRN   albuterol (VENTOLIN HFA) 108 (90 Base) MCG/ACT inhaler 2 puffs, Inhalation, Every 6 hours PRN   Calcium Antacid Ultra Max St 2,000 mg, Oral, Daily PRN   cyclobenzaprine (FLEXERIL) 10 mg, Oral, 2 times daily PRN   montelukast (SINGULAIR) 10 mg, Oral, Daily at bedtime   NIFEdipine (PROCARDIA-XL/NIFEDICAL-XL) 30 mg, Oral, Daily   ondansetron (ZOFRAN ODT) 8 mg, Oral, Every 8 hours PRN   Prenatal Vit-Fe Fumarate-FA (PRENATAL PO) 1 tablet, Oral, Daily   triamcinolone cream (KENALOG) 0.1 % 1 application, Topical, 2 times daily PRN   valACYclovir (VALTREX) 1,000 mg, Oral, Daily  Review of Systems ROS  Constitutional: Denies fevers/chills Cardiovascular: Denies chest pain or palpitations Pulmonary: Denies coughing or wheezing Gastrointestinal: Denies nausea, vomiting or diarrhea Genitourinary: Denies unusual vaginal discharge, dysuria, urgency or frequency. With unusual vaginal bleeding. With abdominal cramping.  Musculoskeletal: Denies muscle or joint aches and pain.  Neurology: Denies abnormal sensations such as tingling or numbness.   Last menstrual period 03/08/2021, unknown if currently breastfeeding. Physical Exam Blood pressure 140/89, pulse 78, temperature 97.9 F (36.6 C), resp. rate 18, height 5\' 7"  (1.702 m), weight  116.6 kg, last menstrual period 03/08/2021, SpO2 97 %, unknown if currently breastfeeding.  Gen: No acute distress, well nourished.  CVS: s1, s2, RR, heart murmur present.  Pulm: CTAB Abd: Soft/Non-tender, non distended. Ext: warm and well perfused, no edema, no calf tenderness  Recent Results (from the past 2160 hour(s))  Pregnancy, urine POC     Status: Abnormal   Collection Time: 04/22/21  1:08 PM  Result Value Ref Range   Preg Test, Ur POSITIVE (A) NEGATIVE    Comment:        THE SENSITIVITY OF THIS METHODOLOGY IS >24 mIU/mL   Urinalysis, Complete w Microscopic Urine, Clean Catch     Status: Abnormal   Collection Time: 04/22/21  1:11 PM  Result Value Ref Range   Color, Urine YELLOW YELLOW   APPearance HAZY (A) CLEAR   Specific Gravity, Urine 1.018 1.005 - 1.030   pH 5.0 5.0 - 8.0   Glucose, UA NEGATIVE NEGATIVE mg/dL   Hgb urine dipstick NEGATIVE NEGATIVE   Bilirubin Urine NEGATIVE NEGATIVE   Ketones, ur NEGATIVE NEGATIVE mg/dL   Protein, ur NEGATIVE NEGATIVE mg/dL   Nitrite NEGATIVE NEGATIVE   Leukocytes,Ua NEGATIVE NEGATIVE   RBC / HPF 0-5 0 - 5 RBC/hpf   WBC, UA 0-5 0 - 5 WBC/hpf   Bacteria, UA RARE (A) NONE SEEN   Squamous Epithelial / LPF 6-10 0 - 5   Mucus PRESENT     Comment: Performed at [facility], [address] [place], [place] [address]  GC/Chlamydia probe amp ([place])not at [facility]     Status: None   Collection Time: 04/22/21  2:52 PM  Result Value Ref Range   Neisseria Gonorrhea Negative    Chlamydia Negative    Comment Normal Reference [place] Chlamydia - Negative    Comment      Normal Reference Range Neisseria Gonorrhea - Negative  CBC     Status: Abnormal   Collection Time: 04/22/21  2:57 PM  Result Value Ref Range   WBC 9.2 4.0 - 10.5 K/uL   RBC 3.40 (L) 3.87 - 5.11 MIL/uL   Hemoglobin 10.1 (L) 12.0 - 15.0 g/dL   HCT 31.2 (L) 36.0 - 46.0 %   MCV 91.8 80.0 - 100.0 fL   MCH 29.7 26.0 - 34.0 pg   MCHC 32.4 30.0 - 36.0 g/dL   RDW 14.0  11.5 - 15.5 %   Platelets 253 150 - 400 K/uL   nRBC 0.0 0.0 - 0.2 %    Comment: Performed at [facility], [place] [address], [place], [place] [address]  Comprehensive metabolic panel     Status: Abnormal   Collection Time: 04/22/21  2:57 PM  Result Value Ref Range   Sodium 135 135 - 145 mmol/L   Potassium 3.7 3.5 - 5.1 mmol/L   Chloride 102 98 - 111 mmol/L   CO2 24 22 - 32 mmol/L   Glucose, Bld 91 70 -  99 mg/dL    Comment: Glucose reference range applies only to samples taken after fasting for at least 8 hours.   BUN 7 6 - 20 mg/dL   Creatinine, Ser 0.90 0.44 - 1.00 mg/dL   Calcium 8.7 (L) 8.9 - 10.3 mg/dL   Total Protein 6.6 6.5 - 8.1 g/dL   Albumin 3.1 (L) 3.5 - 5.0 g/dL   AST 13 (L) 15 - 41 U/L   ALT 12 0 - 44 U/L   Alkaline Phosphatase 42 38 - 126 U/L   Total Bilirubin 0.6 0.3 - 1.2 mg/dL   GFR, Estimated >60 >60 mL/min    Comment: (NOTE) Calculated using the CKD-EPI Creatinine Equation (2021)    Anion gap 9 5 - 15    Comment: Performed at [facility], [place] [address] [place], [place] [address]  hCG, quantitative, pregnancy     Status: Abnormal   Collection Time: 04/22/21  2:57 PM  Result Value Ref Range   hCG, Beta Chain, Quant, S 35,199 (H) <5 mIU/mL    Comment:          GEST. AGE      CONC.  (mIU/mL)   <=1 WEEK        5 - 50     2 WEEKS       50 - 500     3 WEEKS       100 - 10,000     4 WEEKS     1,000 - 30,000     5 WEEKS     3,500 - 115,000   6-8 WEEKS     12,000 - 270,000    12 WEEKS     15,000 - 220,000        FEMALE AND NON-PREGNANT FEMALE:     LESS THAN 5 mIU/mL Performed at [facility], [place] [address] [place], [place] [address]   Wet prep, genital     Status: Abnormal   Collection Time: 04/22/21  3:12 PM   Specimen: Vaginal  Result Value Ref Range   Yeast Wet Prep HPF POC NONE SEEN NONE SEEN   Trich, Wet Prep NONE SEEN NONE SEEN   Clue Cells Wet Prep HPF POC PRESENT (A) NONE SEEN   WBC, Wet Prep HPF POC FEW (A) NONE SEEN   Sperm  NONE SEEN     Comment: Performed at [facility], [place] [address], [place], [place] [address]  CBC     Status: Abnormal   Collection Time: 06/10/21  7:22 AM  Result Value Ref Range   WBC 8.2 4.0 - 10.5 K/uL   RBC 3.48 (L) 3.87 - 5.11 MIL/uL   Hemoglobin 10.3 (L) 12.0 - 15.0 g/dL   HCT 32.5 (L) 36.0 - 46.0 %   MCV 93.4 80.0 - 100.0 fL   MCH 29.6 26.0 - 34.0 pg   MCHC 31.7 30.0 - 36.0 g/dL   RDW 14.7 11.5 - 15.5 %   Platelets 278 150 - 400 K/uL   nRBC 0.0 0.0 - 0.2 %    Comment: Performed at [facility], [place] [address] [place], [place] [address]  Basic metabolic panel per protocol     Status: Abnormal   Collection Time: 06/10/21  7:22 AM  Result Value Ref Range   Sodium 136 135 - 145 mmol/L   Potassium 3.4 (L) 3.5 - 5.1 mmol/L   Chloride 102 98 - 111 mmol/L   CO2 23 22 - 32  mmol/L   Glucose, Bld 106 (H) 70 - 99 mg/dL    Comment: Glucose reference range applies only to samples taken after fasting for at least 8 hours.   BUN 9 6 - 20 mg/dL   Creatinine, Ser 0.86 0.44 - 1.00 mg/dL   Calcium 8.7 (L) 8.9 - 10.3 mg/dL   GFR, Estimated >60 >60 mL/min    Comment: (NOTE) Calculated using the CKD-EPI Creatinine Equation (2021)    Anion gap 11 5 - 15    Comment: Performed at [facility] [address], [place], [place] [address]  Type and screen     Status: None   Collection Time: 06/10/21  7:45 AM  Result Value Ref Range   ABO/RH(D) A NEG    Antibody Screen NEG    Sample Expiration      06/13/2021,2359 Performed at [facility], [place] [address] [place], [place] [address]      Pelvic Ultrasound 06/09/2021:  Anteverted uterus measuring10 cm. Complex collection within endometrium (6.5 x 2.6 x 3.2 cm) extending to external cervical os likely retained products of conception. 5 fibroids, 3 are subserosal and 2 are intramural of sizes 1.8 to 3.5 cm.  Normal left and right ovaries.    Assessment/Plan: 40 y/o G3P0120 with retained products of conception after  cytotec use for a missed abortion, here for a suction dilation and curettage,  - Admit to Day Surgery. - NPO and IV fluids.  - This procedure has been fully reviewed with the patient and written informed consent has been obtained.  -Doxycycline pre-operatively. - Blood group Rh negative, plan for Rhogam post procedure. - With a history of DVT and PEs, plan for prophylactic lovenox for 10 days after the procedure.   Archie Endo 06/09/2021, 10:57 PM

## 2021-06-09 NOTE — Discharge Instructions (Signed)
[place], 1. Nothing in vagina x 2 weeks: no sexual intercourse, no douching, no tampons x 2 weeks.  2. Expect some vaginal bleeding for next several days, call me if with excessive bleeding requiring you to change a pad every hour.  3.  Expect some abdominal cramping, take pain medication as needed.  Call me if pain is intolerable despite medication use.  Dr. Alesia Richards.   [facility] Phone: [phone] extension [phone].

## 2021-06-10 ENCOUNTER — Ambulatory Visit (HOSPITAL_COMMUNITY): Payer: Medicaid Other

## 2021-06-10 ENCOUNTER — Encounter (HOSPITAL_COMMUNITY): Payer: Self-pay | Admitting: Obstetrics & Gynecology

## 2021-06-10 ENCOUNTER — Ambulatory Visit (HOSPITAL_COMMUNITY)
Admission: RE | Admit: 2021-06-10 | Discharge: 2021-06-10 | Disposition: A | Discharge status: Home / Self Care | Payer: Medicaid Other | Attending: Obstetrics & Gynecology | Admitting: Obstetrics & Gynecology

## 2021-06-10 ENCOUNTER — Encounter (HOSPITAL_COMMUNITY)
Admission: RE | Disposition: A | Discharge status: Home / Self Care | Payer: Self-pay | Source: Home / Self Care | Attending: Obstetrics & Gynecology

## 2021-06-10 ENCOUNTER — Ambulatory Visit (HOSPITAL_COMMUNITY): Payer: Medicaid Other | Admitting: Anesthesiology

## 2021-06-10 DIAGNOSIS — Z86711 Personal history of pulmonary embolism: Secondary | ICD-10-CM | POA: Diagnosis not present

## 2021-06-10 DIAGNOSIS — O039 Complete or unspecified spontaneous abortion without complication: Secondary | ICD-10-CM

## 2021-06-10 DIAGNOSIS — O021 Missed abortion: Secondary | ICD-10-CM | POA: Diagnosis not present

## 2021-06-10 DIAGNOSIS — Z86718 Personal history of other venous thrombosis and embolism: Secondary | ICD-10-CM | POA: Diagnosis not present

## 2021-06-10 DIAGNOSIS — D259 Leiomyoma of uterus, unspecified: Secondary | ICD-10-CM | POA: Diagnosis not present

## 2021-06-10 DIAGNOSIS — Z9889 Other specified postprocedural states: Secondary | ICD-10-CM

## 2021-06-10 DIAGNOSIS — O3411 Maternal care for benign tumor of corpus uteri, first trimester: Secondary | ICD-10-CM | POA: Diagnosis not present

## 2021-06-10 HISTORY — DX: Anxiety disorder, unspecified: F41.9

## 2021-06-10 HISTORY — PX: DILATION AND CURETTAGE OF UTERUS: SHX78

## 2021-06-10 LAB — CBC
HCT: 32.5 % — ABNORMAL LOW (ref 36.0–46.0)
Hemoglobin: 10.3 g/dL — ABNORMAL LOW (ref 12.0–15.0)
MCH: 29.6 pg (ref 26.0–34.0)
MCHC: 31.7 g/dL (ref 30.0–36.0)
MCV: 93.4 fL (ref 80.0–100.0)
Platelets: 278 10*3/uL (ref 150–400)
RBC: 3.48 MIL/uL — ABNORMAL LOW (ref 3.87–5.11)
RDW: 14.7 % (ref 11.5–15.5)
WBC: 8.2 10*3/uL (ref 4.0–10.5)
nRBC: 0 % (ref 0.0–0.2)

## 2021-06-10 LAB — BASIC METABOLIC PANEL
Anion gap: 11 (ref 5–15)
BUN: 9 mg/dL (ref 6–20)
CO2: 23 mmol/L (ref 22–32)
Calcium: 8.7 mg/dL — ABNORMAL LOW (ref 8.9–10.3)
Chloride: 102 mmol/L (ref 98–111)
Creatinine, Ser: 0.86 mg/dL (ref 0.44–1.00)
GFR, Estimated: >60 mL/min (ref >60)
Glucose, Bld: 106 mg/dL — ABNORMAL HIGH (ref 70–99)
Potassium: 3.4 mmol/L — ABNORMAL LOW (ref 3.5–5.1)
Sodium: 136 mmol/L (ref 135–145)

## 2021-06-10 LAB — TYPE AND SCREEN
ABO/RH(D): A NEG
Antibody Screen: NEGATIVE

## 2021-06-10 SURGERY — DILATION AND CURETTAGE
Anesthesia: General

## 2021-06-10 MED ORDER — AMISULPRIDE (ANTIEMETIC) 5 MG/2ML IV SOLN
INTRAVENOUS | Status: AC
  Filled 2021-06-10: qty 4

## 2021-06-10 MED ORDER — POVIDONE-IODINE 10 % EX SWAB
2.0000 "application " | Freq: Once | CUTANEOUS | Status: AC
  Administered 2021-06-10: 2 via TOPICAL

## 2021-06-10 MED ORDER — HYDROMORPHONE HCL 1 MG/ML IJ SOLN
0.5000 mg | INTRAMUSCULAR | Status: DC | PRN
  Administered 2021-06-10: 0.5 mg via INTRAVENOUS

## 2021-06-10 MED ORDER — ACETAMINOPHEN 500 MG PO TABS
1000.0000 mg | ORAL_TABLET | Freq: Four times a day (QID) | ORAL | Status: DC
  Administered 2021-06-10: 1000 mg via ORAL

## 2021-06-10 MED ORDER — MIDAZOLAM HCL 2 MG/2ML IJ SOLN
INTRAMUSCULAR | Status: AC
  Filled 2021-06-10: qty 2

## 2021-06-10 MED ORDER — ONDANSETRON HCL 4 MG/2ML IJ SOLN: 4.0000 mg | Freq: Four times a day (QID) | INTRAMUSCULAR | Status: DC | PRN

## 2021-06-10 MED ORDER — PROMETHAZINE HCL 25 MG/ML IJ SOLN
INTRAMUSCULAR | Status: AC
  Filled 2021-06-10: qty 1

## 2021-06-10 MED ORDER — METHYLERGONOVINE MALEATE 0.2 MG/ML IJ SOLN
INTRAMUSCULAR | Status: DC | PRN
  Administered 2021-06-10: .2 mg via INTRAMUSCULAR

## 2021-06-10 MED ORDER — PROPOFOL 500 MG/50ML IV EMUL
INTRAVENOUS | Status: DC | PRN
  Administered 2021-06-10: 150 ug/kg/min via INTRAVENOUS

## 2021-06-10 MED ORDER — ONDANSETRON HCL 4 MG PO TABS: 4.0000 mg | ORAL_TABLET | Freq: Four times a day (QID) | ORAL | Status: DC | PRN

## 2021-06-10 MED ORDER — AMISULPRIDE (ANTIEMETIC) 5 MG/2ML IV SOLN
10.0000 mg | Freq: Once | INTRAVENOUS | Status: AC | PRN
  Administered 2021-06-10: 10 mg via INTRAVENOUS

## 2021-06-10 MED ORDER — ACETAMINOPHEN 500 MG PO TABS
ORAL_TABLET | ORAL | Status: AC
  Filled 2021-06-10: qty 2

## 2021-06-10 MED ORDER — FENTANYL CITRATE (PF) 100 MCG/2ML IJ SOLN
25.0000 ug | INTRAMUSCULAR | Status: DC | PRN
  Administered 2021-06-10 (×3): 25 ug via INTRAVENOUS

## 2021-06-10 MED ORDER — LACTATED RINGERS IV SOLN: INTRAVENOUS | Status: DC

## 2021-06-10 MED ORDER — ORAL CARE MOUTH RINSE: 15.0000 mL | Freq: Once | OROMUCOSAL | Status: AC

## 2021-06-10 MED ORDER — METHYLERGONOVINE MALEATE 0.2 MG/ML IJ SOLN: 0.2000 mg | INTRAMUSCULAR | Status: DC

## 2021-06-10 MED ORDER — FENTANYL CITRATE (PF) 250 MCG/5ML IJ SOLN
INTRAMUSCULAR | Status: AC
  Filled 2021-06-10: qty 5

## 2021-06-10 MED ORDER — ONDANSETRON HCL 4 MG/2ML IJ SOLN
4.0000 mg | Freq: Once | INTRAMUSCULAR | Status: AC
  Administered 2021-06-10: 4 mg via INTRAVENOUS

## 2021-06-10 MED ORDER — METHYLERGONOVINE MALEATE 0.2 MG PO TABS: 0.2000 mg | ORAL_TABLET | ORAL | Status: DC

## 2021-06-10 MED ORDER — ACETAMINOPHEN 500 MG PO TABS
1000.0000 mg | ORAL_TABLET | Freq: Once | ORAL | Status: AC
  Administered 2021-06-10: 1000 mg via ORAL
  Filled 2021-06-10: qty 2

## 2021-06-10 MED ORDER — BUPIVACAINE HCL (PF) 0.25 % IJ SOLN
INTRAMUSCULAR | Status: DC | PRN
  Administered 2021-06-10: 20 mL

## 2021-06-10 MED ORDER — DEXMEDETOMIDINE (PRECEDEX) IN NS 20 MCG/5ML (4 MCG/ML) IV SYRINGE
PREFILLED_SYRINGE | INTRAVENOUS | Status: DC | PRN
  Administered 2021-06-10: 4 ug via INTRAVENOUS

## 2021-06-10 MED ORDER — KETOROLAC TROMETHAMINE 30 MG/ML IJ SOLN: 30.0000 mg | Freq: Four times a day (QID) | INTRAMUSCULAR | Status: DC

## 2021-06-10 MED ORDER — 0.9 % SODIUM CHLORIDE (POUR BTL) OPTIME
TOPICAL | Status: DC | PRN
  Administered 2021-06-10: 500 mL

## 2021-06-10 MED ORDER — METHYLERGONOVINE MALEATE 0.2 MG/ML IJ SOLN
0.2000 mg | INTRAMUSCULAR | Status: AC
  Administered 2021-06-10: 0.2 mg via INTRAMUSCULAR
  Filled 2021-06-10 (×2): qty 1

## 2021-06-10 MED ORDER — ENOXAPARIN SODIUM 60 MG/0.6ML IJ SOSY: 60.0000 mg | PREFILLED_SYRINGE | INTRAMUSCULAR | 0 refills | Status: DC

## 2021-06-10 MED ORDER — RHO D IMMUNE GLOBULIN 1500 UNIT/2ML IJ SOSY
300.0000 ug | PREFILLED_SYRINGE | Freq: Once | INTRAMUSCULAR | Status: AC
  Administered 2021-06-10: 300 ug via INTRAMUSCULAR
  Filled 2021-06-10: qty 2

## 2021-06-10 MED ORDER — ENOXAPARIN SODIUM 60 MG/0.6ML IJ SOSY: 60.0000 mg | PREFILLED_SYRINGE | INTRAMUSCULAR | 0 refills | Status: AC

## 2021-06-10 MED ORDER — PROMETHAZINE HCL 25 MG/ML IJ SOLN
6.2500 mg | INTRAMUSCULAR | Status: DC | PRN
  Administered 2021-06-10: 6.25 mg via INTRAVENOUS

## 2021-06-10 MED ORDER — IBUPROFEN 600 MG PO TABS: 600.0000 mg | ORAL_TABLET | Freq: Four times a day (QID) | ORAL | Status: DC

## 2021-06-10 MED ORDER — MIDAZOLAM HCL 5 MG/5ML IJ SOLN
INTRAMUSCULAR | Status: DC | PRN
  Administered 2021-06-10: 2 mg via INTRAVENOUS

## 2021-06-10 MED ORDER — FENTANYL CITRATE (PF) 100 MCG/2ML IJ SOLN
INTRAMUSCULAR | Status: DC | PRN
  Administered 2021-06-10 (×2): 50 ug via INTRAVENOUS
  Administered 2021-06-10: 25 ug via INTRAVENOUS

## 2021-06-10 MED ORDER — CHLORHEXIDINE GLUCONATE 0.12 % MT SOLN
15.0000 mL | Freq: Once | OROMUCOSAL | Status: AC
  Administered 2021-06-10: 15 mL via OROMUCOSAL
  Filled 2021-06-10: qty 15

## 2021-06-10 MED ORDER — DEXAMETHASONE SODIUM PHOSPHATE 10 MG/ML IJ SOLN
INTRAMUSCULAR | Status: DC | PRN
  Administered 2021-06-10: 10 mg via INTRAVENOUS

## 2021-06-10 MED ORDER — KETOROLAC TROMETHAMINE 30 MG/ML IJ SOLN
INTRAMUSCULAR | Status: DC | PRN
  Administered 2021-06-10: 30 mg via INTRAVENOUS

## 2021-06-10 MED ORDER — PROPOFOL 10 MG/ML IV BOLUS
INTRAVENOUS | Status: AC
  Filled 2021-06-10: qty 20

## 2021-06-10 MED ORDER — DOXYCYCLINE HYCLATE 100 MG PO TABS
200.0000 mg | ORAL_TABLET | Freq: Once | ORAL | Status: AC
  Administered 2021-06-10: 200 mg via ORAL
  Filled 2021-06-10: qty 2

## 2021-06-10 MED ORDER — HYDROMORPHONE HCL 1 MG/ML IJ SOLN
INTRAMUSCULAR | Status: AC
  Filled 2021-06-10: qty 1

## 2021-06-10 MED ORDER — ONDANSETRON HCL 4 MG/2ML IJ SOLN
INTRAMUSCULAR | Status: AC
  Filled 2021-06-10: qty 2

## 2021-06-10 MED ORDER — IBUPROFEN 600 MG PO TABS
600.0000 mg | ORAL_TABLET | Freq: Four times a day (QID) | ORAL | 0 refills | Status: AC
Start: 2021-06-11 — End: ?

## 2021-06-10 MED ORDER — METHYLERGONOVINE MALEATE 0.2 MG/ML IJ SOLN
INTRAMUSCULAR | Status: AC
  Filled 2021-06-10: qty 1

## 2021-06-10 MED ORDER — ONDANSETRON HCL 4 MG PO TABS: 4.0000 mg | ORAL_TABLET | Freq: Four times a day (QID) | ORAL | 0 refills | Status: AC | PRN

## 2021-06-10 MED ORDER — FENTANYL CITRATE (PF) 100 MCG/2ML IJ SOLN
INTRAMUSCULAR | Status: AC
  Filled 2021-06-10: qty 2

## 2021-06-10 SURGICAL SUPPLY — 12 items
CATH ROBINSON RED A/P 16FR (CATHETERS) ×3 IMPLANT
CNTNR URN SCR LID CUP LEK RST (MISCELLANEOUS) ×2 IMPLANT
CONT SPEC 4OZ STRL OR WHT (MISCELLANEOUS) ×2
GLOVE SURG POLYISO LF SZ6.5 (GLOVE) ×6 IMPLANT
GLOVE SURG UNDER POLY LF SZ7 (GLOVE) ×6 IMPLANT
GOWN STRL REUS W/ TWL LRG LVL3 (GOWN DISPOSABLE) ×4 IMPLANT
GOWN STRL REUS W/TWL LRG LVL3 (GOWN DISPOSABLE) ×4
PACK VAGINAL MINOR WOMEN LF (CUSTOM PROCEDURE TRAY) ×3 IMPLANT
PAD OB MATERNITY 4.3X12.25 (PERSONAL CARE ITEMS) ×3 IMPLANT
SET BERKELEY SUCTION TUBING (SUCTIONS) ×1 IMPLANT
TOWEL GREEN STERILE FF (TOWEL DISPOSABLE) ×6 IMPLANT
VACURETTE 8MM F TIP (MISCELLANEOUS) ×1 IMPLANT

## 2021-06-10 NOTE — Transfer of Care (Signed)
Immediate Anesthesia Transfer of Care Note  Patient: Sheri Robinson  Procedure(s) Performed: DILATATION AND CURETTAGE  Patient Location: PACU  Anesthesia Type:MAC  Level of Consciousness: drowsy and patient cooperative  Airway & Oxygen Therapy: Patient Spontanous Breathing  Post-op Assessment: Report given to RN and Post -op Vital signs reviewed and stable  Post vital signs: Reviewed and stable  Last Vitals:  Vitals Value Taken Time  BP 135/83 06/10/21 1044  Temp    Pulse 70 06/10/21 1049  Resp 18 06/10/21 1049  SpO2 100 % 06/10/21 1049  Vitals shown include unvalidated device data.  Last Pain:  Vitals:   06/10/21 0806  PainSc: 5       Patients Stated Pain Goal: 3 (58/68/25 7493)  Complications: No notable events documented.

## 2021-06-10 NOTE — Anesthesia Postprocedure Evaluation (Addendum)
Anesthesia Post Note  Patient: Sheri Robinson  Procedure(s) Performed: DILATATION AND CURETTAGE     Patient location during evaluation: PACU Anesthesia Type: MAC Level of consciousness: awake and alert Pain management: pain level controlled Vital Signs Assessment: post-procedure vital signs reviewed and stable Respiratory status: spontaneous breathing, nonlabored ventilation, respiratory function stable and patient connected to nasal cannula oxygen Cardiovascular status: blood pressure returned to baseline and stable Postop Assessment: no apparent nausea or vomiting Anesthetic complications: no   No notable events documented.  Last Vitals:  Vitals:   06/10/21 1445 06/10/21 1500  BP: 138/88 (!) 131/92  Pulse: 75 76  Resp: (!) 27 (!) 21  Temp:    SpO2: 100% 100%    Last Pain:  Vitals:   06/10/21 1445  PainSc: Asleep                 [name]

## 2021-06-10 NOTE — Op Note (Addendum)
Patient: Sheri Robinson Date of birth: 1980-12-16 MRN: [id number] Date of procedure: 06/10/2021  Preop diagnosis:  Retained products of conception.  Missed abortion at [redacted] weeks EGA and s/p vaginal misoprostol use.  History of DVT and pulmonary embolus.  Uterus with fibroids. BMI of 40. Blood group A negative.   Postop diagnosis: Same as above.    Procedure:  1. Suction dilation and curettage (D &C) with ultrasound guidance.   Surgeon: Dr. Waymon Amato.  Anesthesia: Monitored anesthesia care and paracervical block by surgeon.   Complications: None  Input: 700cc LR  Output: EBL: 120cc including products of conception.    Urine: patient voided.    Findings: 10 week sized uterus with fibroids and with retained products of conception. No palpable adnexal masses bilaterally. Cervix 1 cm dilated with small blood coming through the os.  Indications:  40 year-old G3P0120 who had a missed abortion at [redacted] weeks EGA and used vaginal misoprostol.  She continued to bleed 3 weeks later after misoprostol use and an office ultrasound showed retained products of conception for which she desired surgical management of suction D & C.  She was consented for the procedure after explaining the risks, benefits and alternatives of the procedure including risks of bleeding, infection and damage to organs.       Procedure: Oral doxycyline was given preoperatively.  She was taken to the operating room where anesthesia was administered without difficulty. She was placed in the dorsal lithotomy position and prepped and draped in the usual sterile fashion.   An exam was performed with above findings. Graves speculum was placed in and cervix grasped with ring forcep anteriorly.  0.25% Marcaine was given as a paracervical block, total 20 cc used.  Cervix was dilated to the # 23 pratt.  Ultrasound guidance with abdominal probe was then available for the rest of the procedure. The # 8 suction curette was advanced  through cervix to fundus and electric suction initiated to green zone, 600 mm Hg to clear the cavity of products of conception.  Three passes were made.  Gentle sharp curettage was then done and uterine walls were noted to be gritty. Suction was done two more times and the endometrial lining was noted to be thin and without any remaining products of conception at this point. There continued to be a moderate amount of bleeding from the cervix despite uterine massage and therefore IM methergine was given to the patient (patient had normal blood pressure at this time).  There continued to be a slow trickle from the cervix which did not resolve with repeat suctioning and uterine massage. At this point a 16 french foley catheter was placed into the uterus and NS instilled into the bulb and it was left in situ.  Small bleeding was noted to collect in the foley bag and there was no bleeding around the foley.  The instruments were then removed and the patient was then awoken from anesthesia.  She was cleaned and taken to recovery room in stable condition.  Plan is for her to get Rhogam before discharge and also to start prophylactic lovenox after the procedure.  Plan is also to remove the foley catheter before discharge.   Specimen: Products of conception.   Dr. Waymon Amato, MD. 06/10/2021. 4:24 PM.

## 2021-06-10 NOTE — Progress Notes (Signed)
Sheri Robinson  Female, 40 y.o. 18-Oct-1980 MRN: [id number]  Postop Check.  S: Patient reports some abdominal cramping, requesting more pain medication. She had some nausea and vomiting earlier but has decreased now.  O: Total EBL in the foley catheter in PACU about 75 cc.  As per RN there has been scant vaginal bleeding since the foley was removed about 2.5 hours after its insertion.    Vitals with BMI 06/10/2021 06/10/2021 06/10/2021  Height - - -  Weight - - -  BMI - - -  Systolic 748 270 786  Diastolic 92 88 79  Pulse 76 75 92  General: Appears well, in no acute distress. Abdomen: Soft, non tender, non distended Peripad: With small blood.  S/p Uterine foley catheter removal.  Extremities: Warm and well perfused, no edema, no calf tenderness.  A/P: 42/0 POD # 0 after suction dilation and Curettage, s/p uterine foley catheter removal, Stable postoperatively and with minimal vaginal bleeding. More pain medication to be given to the patient for comfort. Procedure was reviewed with the patient. Post operative care was also discussed with the patient She is advised to start lovenox for DVT prophylaxis this evening and an over the counter iron tablet every other day. Pain and bleeding precautions were reviewed. She may be discharged to home when stable per PACU standards.  Dr. Waymon Amato.  06/10/2021.  4:49 PM.

## 2021-06-10 NOTE — Anesthesia Preprocedure Evaluation (Addendum)
Anesthesia Evaluation  Patient identified by MRN, date of birth, ID band Patient awake    Reviewed: Allergy & Precautions, NPO status , Patient's Chart, lab work & pertinent test results  Airway Mallampati: II  TM Distance: >3 FB Neck ROM: Full    Dental  (+) Dental Advisory Given   Pulmonary asthma ,    breath sounds clear to auscultation       Cardiovascular hypertension, Pt. on medications + Peripheral Vascular Disease   Rhythm:Regular Rate:Normal     Neuro/Psych negative neurological ROS     GI/Hepatic Neg liver ROS, GERD  ,  Endo/Other  Morbid obesity  Renal/GU negative Renal ROS     Musculoskeletal  (+) Arthritis ,   Abdominal   Peds  Hematology  (+) anemia ,   Anesthesia Other Findings   Reproductive/Obstetrics                             Anesthesia Physical Anesthesia Plan  ASA: 3  Anesthesia Plan: MAC   Post-op Pain Management: Minimal or no pain anticipated, Tylenol PO (pre-op) and Toradol IV (intra-op)   Induction:   PONV Risk Score and Plan: 3 and Dexamethasone, Ondansetron, Treatment may vary due to age or medical condition and Propofol infusion  Airway Management Planned: Natural Airway and Simple Face Mask  Additional Equipment:   Intra-op Plan:   Post-operative Plan:   Informed Consent: I have reviewed the patients History and Physical, chart, labs and discussed the procedure including the risks, benefits and alternatives for the proposed anesthesia with the patient or authorized representative who has indicated his/her understanding and acceptance.     Dental advisory given  Plan Discussed with: CRNA  Anesthesia Plan Comments:        Anesthesia Quick Evaluation

## 2021-06-10 NOTE — Addendum Note (Signed)
Addendum  created 06/10/21 1637 by Suzette Battiest, MD   Clinical Note Signed

## 2021-06-10 NOTE — Interval H&P Note (Signed)
History and Physical Interval Note:  06/10/2021 9:28 AM  Sheri Robinson  has presented today for surgery, with the diagnosis of RETAINED PRODUCTS OF CONCEPTION.  The various methods of treatment have been discussed with the patient and family. After consideration of risks, benefits and other options for treatment, the patient has consented to  Procedure(s): DILATATION AND CURETTAGE (N/A), SUCTION as a surgical intervention.  The patient's history has been reviewed, patient examined, no change in status, stable for surgery.  I have reviewed the patient's chart and labs.  Questions were answered to the patient's satisfaction.     Archie Endo, MD.

## 2021-06-11 ENCOUNTER — Encounter (HOSPITAL_COMMUNITY): Payer: Self-pay | Admitting: Obstetrics & Gynecology

## 2021-06-11 LAB — RH IG WORKUP (INCLUDES ABO/RH)
Gestational Age(Wks): 8
Unit division: 0

## 2021-06-13 LAB — SURGICAL PATHOLOGY

## 2022-03-08 ENCOUNTER — Encounter (HOSPITAL_COMMUNITY): Payer: Self-pay

## 2022-03-08 ENCOUNTER — Other Ambulatory Visit: Payer: Self-pay

## 2022-03-08 ENCOUNTER — Emergency Department (HOSPITAL_COMMUNITY)
Admission: EM | Admit: 2022-03-08 | Discharge: 2022-03-09 | Disposition: A | Discharge status: Home / Self Care | Payer: 59 | Attending: Emergency Medicine | Admitting: Emergency Medicine

## 2022-03-08 ENCOUNTER — Emergency Department (HOSPITAL_COMMUNITY): Payer: 59

## 2022-03-08 DIAGNOSIS — D259 Leiomyoma of uterus, unspecified: Secondary | ICD-10-CM | POA: Insufficient documentation

## 2022-03-08 DIAGNOSIS — R112 Nausea with vomiting, unspecified: Secondary | ICD-10-CM | POA: Diagnosis not present

## 2022-03-08 DIAGNOSIS — R55 Syncope and collapse: Secondary | ICD-10-CM | POA: Diagnosis present

## 2022-03-08 DIAGNOSIS — R030 Elevated blood-pressure reading, without diagnosis of hypertension: Secondary | ICD-10-CM | POA: Diagnosis not present

## 2022-03-08 DIAGNOSIS — N9489 Other specified conditions associated with female genital organs and menstrual cycle: Secondary | ICD-10-CM | POA: Insufficient documentation

## 2022-03-08 DIAGNOSIS — R252 Cramp and spasm: Secondary | ICD-10-CM | POA: Insufficient documentation

## 2022-03-08 DIAGNOSIS — R42 Dizziness and giddiness: Secondary | ICD-10-CM | POA: Insufficient documentation

## 2022-03-08 LAB — BASIC METABOLIC PANEL
Anion gap: 12 (ref 5–15)
BUN: 6 mg/dL (ref 6–20)
CO2: 23 mmol/L (ref 22–32)
Calcium: 9.2 mg/dL (ref 8.9–10.3)
Chloride: 101 mmol/L (ref 98–111)
Creatinine, Ser: 0.95 mg/dL (ref 0.44–1.00)
GFR, Estimated: >60 mL/min (ref >60)
Glucose, Bld: 92 mg/dL (ref 70–99)
Potassium: 4.5 mmol/L (ref 3.5–5.1)
Sodium: 136 mmol/L (ref 135–145)

## 2022-03-08 LAB — CBC WITH DIFFERENTIAL/PLATELET
Abs Immature Granulocytes: 0.06 10*3/uL (ref 0.00–0.07)
Basophils Absolute: 0 10*3/uL (ref 0.0–0.1)
Basophils Relative: 0 %
Eosinophils Absolute: 0 10*3/uL (ref 0.0–0.5)
Eosinophils Relative: 0 %
HCT: 36.1 % (ref 36.0–46.0)
Hemoglobin: 11.5 g/dL — ABNORMAL LOW (ref 12.0–15.0)
Immature Granulocytes: 1 %
Lymphocytes Relative: 19 %
Lymphs Abs: 1.9 10*3/uL (ref 0.7–4.0)
MCH: 29.9 pg (ref 26.0–34.0)
MCHC: 31.9 g/dL (ref 30.0–36.0)
MCV: 93.8 fL (ref 80.0–100.0)
Monocytes Absolute: 0.6 10*3/uL (ref 0.1–1.0)
Monocytes Relative: 6 %
Neutro Abs: 7.3 10*3/uL (ref 1.7–7.7)
Neutrophils Relative %: 74 %
Platelets: 295 10*3/uL (ref 150–400)
RBC: 3.85 MIL/uL — ABNORMAL LOW (ref 3.87–5.11)
RDW: 13.2 % (ref 11.5–15.5)
WBC: 10 10*3/uL (ref 4.0–10.5)
nRBC: 0 % (ref 0.0–0.2)

## 2022-03-08 LAB — URINALYSIS, ROUTINE W REFLEX MICROSCOPIC
Bilirubin Urine: NEGATIVE
Glucose, UA: NEGATIVE mg/dL
Hgb urine dipstick: NEGATIVE
Ketones, ur: NEGATIVE mg/dL
Leukocytes,Ua: NEGATIVE
Nitrite: NEGATIVE
Protein, ur: NEGATIVE mg/dL
Specific Gravity, Urine: 1.012 (ref 1.005–1.030)
pH: 5 (ref 5.0–8.0)

## 2022-03-08 LAB — I-STAT BETA HCG BLOOD, ED (MC, WL, AP ONLY): I-stat hCG, quantitative: <5 m[IU]/mL (ref <5)

## 2022-03-08 LAB — LACTIC ACID, PLASMA: Lactic Acid, Venous: 1.2 mmol/L (ref 0.5–1.9)

## 2022-03-08 MED ORDER — IOHEXOL 350 MG/ML SOLN
75.0000 mL | Freq: Once | INTRAVENOUS | Status: AC | PRN
  Administered 2022-03-08: 75 mL via INTRAVENOUS

## 2022-03-08 MED ORDER — ACETAMINOPHEN 500 MG PO TABS
1000.0000 mg | ORAL_TABLET | Freq: Once | ORAL | Status: AC
  Administered 2022-03-08: 1000 mg via ORAL
  Filled 2022-03-08: qty 2

## 2022-03-08 NOTE — ED Provider Notes (Addendum)
[facility] EMERGENCY DEPARTMENT Provider Note   CSN: [id number] Arrival date & time: 03/08/22  1526     History  Chief Complaint  Patient presents with   Loss of Consciousness    Sheri Robinson is a 41 y.o. female.  Patient c/o episode of feeling faint. Symptoms acute onset earlier today. States was sitting, working from home, felt 'charley-horse' type cramps in legs, got up to 'walk it off', was stretching, bent over and then stood back up, felt faint, nauseated, lightheaded, states saw black spots in vision, vomit x 1 (not bloody or bilious), fell forward onto knees, and then onto/across bed, briefly unresponsive.  Denies hx same. No associated severe headache, no chest pain or discomfort, no sob or unusual doe, no fever or chills, no abd pain or diarrhea, no recent blood loss, rectal bleeding, vaginal bleeding or melena, no dysuria or gu c/o, no uri symptoms. Denies any change in speech or vision. No new numbness/weakness or loss of functional ability. Earlier visual symptoms resolved. Denies hx seizures or syncope.   The history is provided by the patient and medical records.  Loss of Consciousness Associated symptoms: nausea and vomiting   Associated symptoms: no chest pain, no confusion, no fever, no palpitations and no shortness of breath        Home Medications Prior to Admission medications   Medication Sig Start Date End Date Taking? Authorizing Provider  acetaminophen (TYLENOL) 500 MG tablet Take 2 tablets (1,000 mg total) by mouth every 6 (six) hours as needed for mild pain, moderate pain or headache. Patient taking differently: Take 500 mg by mouth every 6 (six) hours as needed for mild pain, moderate pain or headache. 01/03/21   Holshouser, Theone Murdoch, CNM  albuterol (VENTOLIN HFA) 108 (90 Base) MCG/ACT inhaler Inhale 2 puffs into the lungs every 6 (six) hours as needed for wheezing or shortness of breath.    [provider]  calcium  elemental as carbonate (CALCIUM ANTACID ULTRA MAX ST) 400 MG chewable tablet Chew 2,000 mg by mouth daily as needed for heartburn.    [provider]  enoxaparin (LOVENOX) 60 MG/0.6ML injection Inject 0.6 mLs (60 mg total) into the skin daily for 10 days. 06/10/21 06/20/21  Waymon Amato, MD  ibuprofen (ADVIL) 600 MG tablet Take 1 tablet (600 mg total) by mouth every 6 (six) hours. 06/11/21   Waymon Amato, MD  montelukast (SINGULAIR) 10 MG tablet Take 10 mg by mouth at bedtime.    [provider]  NIFEdipine (PROCARDIA-XL/NIFEDICAL-XL) 30 MG 24 hr tablet Take 30 mg by mouth daily.    [provider]  ondansetron (ZOFRAN ODT) 8 MG disintegrating tablet Take 1 tablet (8 mg total) by mouth every 8 (eight) hours as needed for nausea or vomiting. Patient not taking: Reported on 06/09/2021 04/22/21   Starr Lake, CNM  ondansetron (ZOFRAN) 4 MG tablet Take 1 tablet (4 mg total) by mouth every 6 (six) hours as needed for nausea. 06/10/21   Waymon Amato, MD  Prenatal Vit-Fe Fumarate-FA (PRENATAL PO) Take 1 tablet by mouth daily.    [provider]  triamcinolone cream (KENALOG) 0.1 % Apply 1 application topically 2 (two) times daily as needed (eczema). 02/17/21   [provider]  valACYclovir (VALTREX) 1000 MG tablet Take 1,000 mg by mouth daily.    [provider]      Allergies    Amoxicillin, Penicillins, and Penicillin g    Review of Systems  Review of Systems  Constitutional:  Negative for chills and fever.  HENT:  Negative for sore throat.   Eyes:  Positive for visual disturbance. Negative for pain.  Respiratory:  Negative for cough and shortness of breath.   Cardiovascular:  Positive for syncope. Negative for chest pain, palpitations and leg swelling.  Gastrointestinal:  Positive for nausea and vomiting. Negative for abdominal pain and blood in stool.  Genitourinary:  Negative for dysuria and flank pain.  Musculoskeletal:  Negative  for back pain and neck pain.  Skin:  Negative for rash.  Neurological:  Positive for syncope and light-headedness.  Hematological:  Does not bruise/bleed easily.  Psychiatric/Behavioral:  Negative for confusion.     Physical Exam Updated Vital Signs BP (!) 145/87   Pulse 80   Temp 98.8 F (37.1 C) (Oral)   Resp 18   Ht 1.702 m ('5\' 7"'$ )   Wt 116.6 kg   SpO2 96%   BMI 40.25 kg/m  Physical Exam Vitals and nursing note reviewed.  Constitutional:      Appearance: Normal appearance. She is well-developed.  HENT:     Head: Atraumatic.     Comments: No sinus or temporal tenderness. No areas of facial or scalp contusion noted.     Nose: Nose normal.     Mouth/Throat:     Mouth: Mucous membranes are moist.  Eyes:     General: No scleral icterus.    Extraocular Movements: Extraocular movements intact.     Conjunctiva/sclera: Conjunctivae normal.     Pupils: Pupils are equal, round, and reactive to light.  Neck:     Vascular: No carotid bruit.     Trachea: No tracheal deviation.     Comments: No stiffness or rigidity.  Cardiovascular:     Rate and Rhythm: Normal rate and regular rhythm.     Pulses: Normal pulses.     Heart sounds: Normal heart sounds. No murmur heard.    No friction rub. No gallop.  Pulmonary:     Effort: Pulmonary effort is normal. No respiratory distress.     Breath sounds: Normal breath sounds.  Abdominal:     General: Bowel sounds are normal. There is no distension.     Palpations: Abdomen is soft. There is no mass.     Tenderness: There is no abdominal tenderness. There is no guarding.  Genitourinary:    Comments: No cva tenderness.  Musculoskeletal:        General: No swelling or tenderness.     Cervical back: Normal range of motion and neck supple. No rigidity or tenderness. No muscular tenderness.     Comments: CTLS spine, non tender, aligned, no step off. Good rom bil extremities, no focal pain or swelling, distal pulses palp.   Skin:    General:  Skin is warm and dry.     Findings: No rash.  Neurological:     Mental Status: She is alert.     Comments: Alert, speech normal. Oriented. Motor/sens grossly intact bil. Steady gait.   Psychiatric:        Mood and Affect: Mood normal.     ED Results / Procedures / Treatments   Labs (all labs ordered are listed, but only abnormal results are displayed) Results for orders placed or performed during the hospital encounter of 45/36/46  Basic metabolic panel  Result Value Ref Range   Sodium 136 135 - 145 mmol/L   Potassium 4.5 3.5 - 5.1 mmol/L   Chloride 101 98 -  111 mmol/L   CO2 23 22 - 32 mmol/L   Glucose, Bld 92 70 - 99 mg/dL   BUN 6 6 - 20 mg/dL   Creatinine, Ser 0.95 0.44 - 1.00 mg/dL   Calcium 9.2 8.9 - 10.3 mg/dL   GFR, Estimated >60 >60 mL/min   Anion gap 12 5 - 15  CBC with Differential  Result Value Ref Range   WBC 10.0 4.0 - 10.5 K/uL   RBC 3.85 (L) 3.87 - 5.11 MIL/uL   Hemoglobin 11.5 (L) 12.0 - 15.0 g/dL   HCT 36.1 36.0 - 46.0 %   MCV 93.8 80.0 - 100.0 fL   MCH 29.9 26.0 - 34.0 pg   MCHC 31.9 30.0 - 36.0 g/dL   RDW 13.2 11.5 - 15.5 %   Platelets 295 150 - 400 K/uL   nRBC 0.0 0.0 - 0.2 %   Neutrophils Relative % 74 %   Neutro Abs 7.3 1.7 - 7.7 K/uL   Lymphocytes Relative 19 %   Lymphs Abs 1.9 0.7 - 4.0 K/uL   Monocytes Relative 6 %   Monocytes Absolute 0.6 0.1 - 1.0 K/uL   Eosinophils Relative 0 %   Eosinophils Absolute 0.0 0.0 - 0.5 K/uL   Basophils Relative 0 %   Basophils Absolute 0.0 0.0 - 0.1 K/uL   Immature Granulocytes 1 %   Abs Immature Granulocytes 0.06 0.00 - 0.07 K/uL  Urinalysis, Routine w reflex microscopic  Result Value Ref Range   Color, Urine YELLOW YELLOW   APPearance HAZY (A) CLEAR   Specific Gravity, Urine 1.012 1.005 - 1.030   pH 5.0 5.0 - 8.0   Glucose, UA NEGATIVE NEGATIVE mg/dL   Hgb urine dipstick NEGATIVE NEGATIVE   Bilirubin Urine NEGATIVE NEGATIVE   Ketones, ur NEGATIVE NEGATIVE mg/dL   Protein, ur NEGATIVE NEGATIVE mg/dL    Nitrite NEGATIVE NEGATIVE   Leukocytes,Ua NEGATIVE NEGATIVE  Lactic acid, plasma  Result Value Ref Range   Lactic Acid, Venous 1.2 0.5 - 1.9 mmol/L  I-Stat beta hCG blood, ED  Result Value Ref Range   I-stat hCG, quantitative <5.0 <5 mIU/mL   Comment 3              EKG EKG Interpretation  Date/Time:  Wednesday March 08 2022 15:44:16 EDT Ventricular Rate:  93 PR Interval:  136 QRS Duration: 62 QT Interval:  342 QTC Calculation: 425 R Axis:   30 Text Interpretation: Normal sinus rhythm Nonspecific T wave abnormality Confirmed by Lajean Saver [phone]) on 03/08/2022 5:30:08 PM  Radiology CT Abdomen Pelvis W Contrast  Result Date: 03/08/2022 CLINICAL DATA:  Syncope with right-sided abdominal pain and lower extremity cramping. EXAM: CT ABDOMEN AND PELVIS WITH CONTRAST TECHNIQUE: Multidetector CT imaging of the abdomen and pelvis was performed using the standard protocol following bolus administration of intravenous contrast. RADIATION DOSE REDUCTION: This exam was performed according to the departmental dose-optimization program which includes automated exposure control, adjustment of the mA and/or kV according to patient size and/or use of iterative reconstruction technique. CONTRAST:  81m OMNIPAQUE IOHEXOL 350 MG/ML SOLN COMPARISON:  January 07, 2021 FINDINGS: Lower chest: No acute abnormality. Hepatobiliary: There is diffuse fatty infiltration of the liver parenchyma. No focal liver abnormality is seen. Status post cholecystectomy. No biliary dilatation. Pancreas: Unremarkable. No pancreatic ductal dilatation or surrounding inflammatory changes. Spleen: Normal in size without focal abnormality. Adrenals/Urinary Tract: Adrenal glands are unremarkable. Kidneys are normal, without renal calculi, focal lesion, or hydronephrosis. Bladder is unremarkable. Stomach/Bowel: Stomach is within  normal limits. Appendix appears normal. No evidence of bowel wall thickening, distention, or inflammatory  changes. Vascular/Lymphatic: No significant vascular findings are present. No enlarged abdominal or pelvic lymph nodes. Reproductive: The uterine fundus is enlarged and lobulated in appearance. The bilateral adnexa are unremarkable. Other: No abdominal wall hernia or abnormality. No abdominopelvic ascites. Musculoskeletal: Degenerative changes are noted at the level of L5-S1. IMPRESSION: 1. Hepatic steatosis. 2. Evidence of prior cholecystectomy. 3. Findings likely consistent with multiple heterogeneous uterine fibroids. Correlation with pelvic ultrasound is recommended. Electronically Signed   By: Virgina Norfolk M.D.   On: 03/08/2022 23:20   CT Head Wo Contrast  Result Date: 03/08/2022 CLINICAL DATA:  Syncope EXAM: CT HEAD WITHOUT CONTRAST TECHNIQUE: Contiguous axial images were obtained from the base of the skull through the vertex without intravenous contrast. RADIATION DOSE REDUCTION: This exam was performed according to the departmental dose-optimization program which includes automated exposure control, adjustment of the mA and/or kV according to patient size and/or use of iterative reconstruction technique. COMPARISON:  None Available. FINDINGS: Brain: No evidence of acute infarction, hemorrhage, hydrocephalus, extra-axial collection or mass lesion/mass effect. Vascular: No hyperdense vessel or unexpected calcification. Skull: Normal. Negative for fracture or focal lesion. Sinuses/Orbits: No acute finding. Other: None IMPRESSION: Negative non contrasted CT appearance of the brain. Electronically Signed   By: Donavan Foil M.D.   On: 03/08/2022 20:20    Procedures Procedures    Medications Ordered in ED Medications  acetaminophen (TYLENOL) tablet 1,000 mg (has no administration in time range)    ED Course/ Medical Decision Making/ A&P                           Medical Decision Making Problems Addressed: Elevated blood pressure reading: acute illness or injury Muscle cramps: acute  illness or injury Nausea and vomiting in adult: acute illness or injury with systemic symptoms Syncope and collapse: acute illness or injury with systemic symptoms that poses a threat to life or bodily functions Uterine leiomyoma, unspecified location: acute illness or injury  Amount and/or Complexity of Data Reviewed External Data Reviewed: notes. Labs: ordered. Decision-making details documented in ED Course. Radiology: ordered and independent interpretation performed. Decision-making details documented in ED Course. ECG/medicine tests: ordered and independent interpretation performed. Decision-making details documented in ED Course.  Risk OTC drugs. Prescription drug management. Decision regarding hospitalization.   Iv ns. Continuous pulse ox and cardiac monitoring. Labs ordered/sent. Imaging ordered.   Diff dx includes syncope, near syncope, vasovagal episode, anemia, dehydration, etc - dispo decision including potential need for admission considered - will get labs and imaging and reassess.   Reviewed nursing notes and prior charts for additional history. External reports reviewed.   Cardiac monitor: sinus rhythm, rate 90.  Labs reviewed/interpreted by me - wbc and hgb normal. Glucose normal. Preg neg.   CT reviewed/interpreted by me - no hem.   Po fluids/food. Ambulate in hall. Acetaminophen po.   Pt inquires about abd pain, right side, lower, mild-mod tenderness. Will get imaging.   Ct reviewed/interpreted by me - fibroids, o/w neg acute.   Earlier symptoms resolved.   Pt appears stable for d/c.   Rec pcp f/u.  Return precautions provided.           Final Clinical Impression(s) / ED Diagnoses Final diagnoses:  Syncope and collapse  Elevated blood pressure reading  Muscle cramps    Rx / DC Orders ED Discharge Orders     None  Lajean Saver, MD 03/09/22 [phone]

## 2022-03-08 NOTE — ED Provider Triage Note (Addendum)
Emergency Medicine Provider Triage Evaluation Note  Sheri Robinson , a 41 y.o. female  was evaluated in triage.  Pt complains of episode of syncope and collapse at 11 AM this morning.  Prior noticed some cramping in her legs and on her right abdomen.  Thought she may have to go to the bathroom.  What looked himself in the mirror started noticing some black spots appearing in her vision.  Then became diaphoretic and felt like she needed to lie down.  Attempted to walk to her bed, however did not make it.  Woke up on the floor in a puddle of vomit and had urinated/defecated on herself.  Noted about 20 minutes of grogginess however denied confusion following the event.  Asymptomatic now.  No significant history of seizure disorders.  1 prior episode of syncope several years ago when she was symptomatically anemic.  Without chest pain, shortness of breath, recent fevers.  Started new medication of digestive enzymes for likely constipation by recommendation of PCP within the last 3 days.  Review of Systems  Positive:  Negative: See above  Physical Exam  BP (!) 140/95 (BP Location: Right Arm)   Pulse 99   Temp 99 F (37.2 C) (Oral)   Resp 15   Ht '5\' 7"'$  (1.702 m)   Wt 116.6 kg   SpO2 95%   BMI 40.25 kg/m  Gen:   Awake, no distress   Resp:  Normal effort  MSK:   Moves extremities without difficulty  Other:  Abdomen soft, nontender.  Mild right mid back tenderness.  No evidence of tongue biting.  Medical Decision Making  Medically screening exam initiated at 4:09 PM.  Appropriate orders placed.  KIMLEY APSEY was informed that the remainder of the evaluation will be completed by another provider, this initial triage assessment does not replace that evaluation, and the importance of remaining in the ED until their evaluation is complete.     Prince Rome, PA-C 48/88/91 [id number]    Prince Rome, PA-C 03/88/82 1612

## 2022-03-08 NOTE — ED Triage Notes (Signed)
Patient reports having a syncopal episode and then vomited upon waking and has vision loss in right eye and blurred in left but vision has returned.  Reports prior to everything was having bad cramps in legs and and a sharp pain in abd.  All has resolved now.

## 2022-03-08 NOTE — Discharge Instructions (Addendum)
It was our pleasure to provide your ER care today - we hope that you feel better.  Drink plenty of fluids/stay well hydrated.   Follow up closely with primary care doctor in the coming week - also have blood pressure rechecked then as it is high today.  Return to ER right away if worse, new symptoms, fevers, chest pain, trouble breathing, new or worsening abdominal pain, persistent vomiting, weak/fainting, or other concern.

## 2022-07-10 IMAGING — US US OB TRANSVAGINAL
1 series · 15 of 28 positions shown · non-contrast
Comparison: 12/23/2020

CLINICAL DATA: Follow-up gestational sac from 12/23/2020, rising
beta HCG of 35,000 and recent methotrexate administration for
ectopic pregnancy. Slight increase in pelvic pain today

EXAM:
TRANSVAGINAL OB ULTRASOUND
TECHNIQUE: Transvaginal ultrasound was performed for complete evaluation of the
gestation as well as the maternal uterus, adnexal regions, and
pelvic cul-de-sac.

[Series 1: us ob transvaginal · 29 acquisitions, 15 frames shown]
[im 1/29]
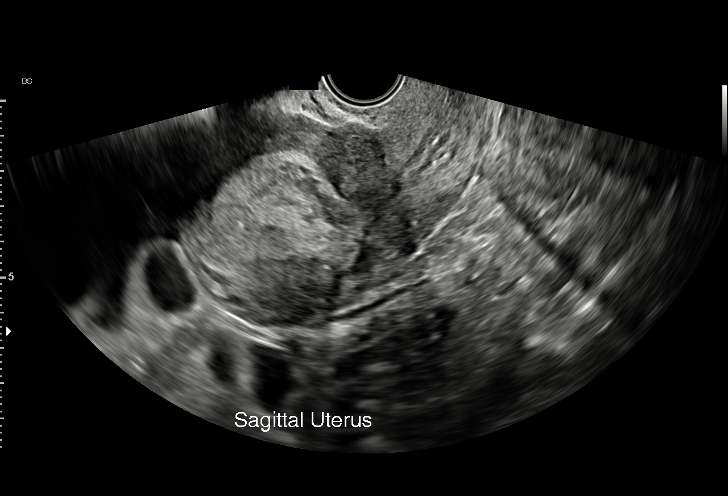
[im 3/29]
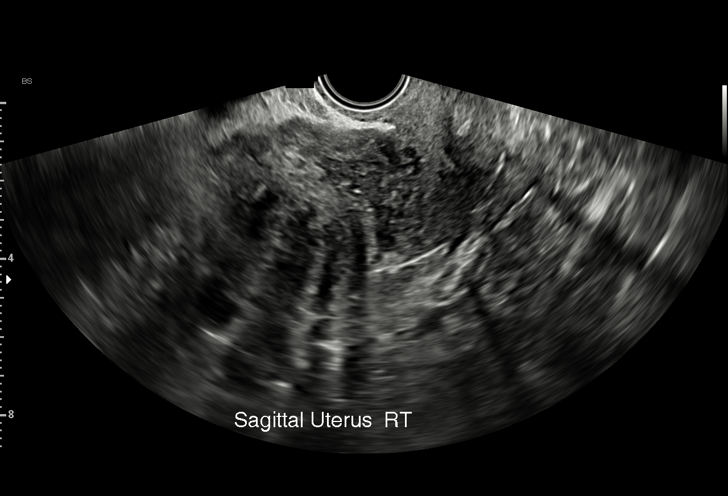
[im 5/29]
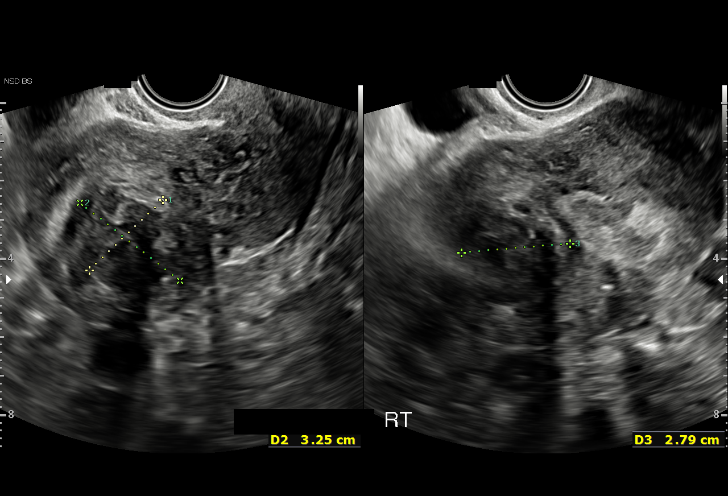
[im 7/29]
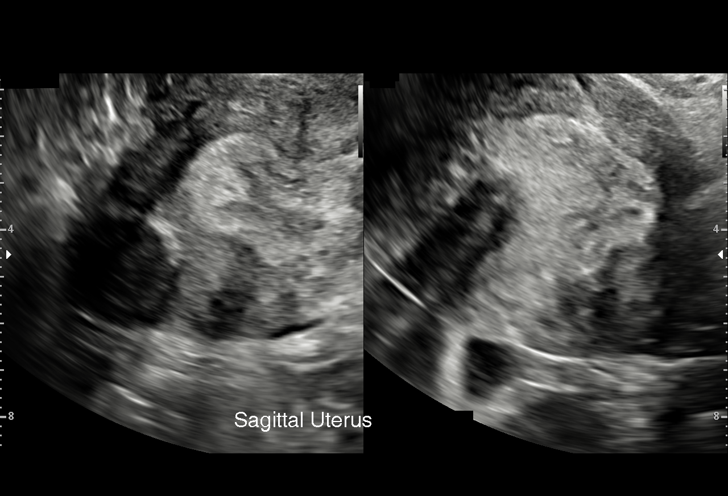
[im 9/29]
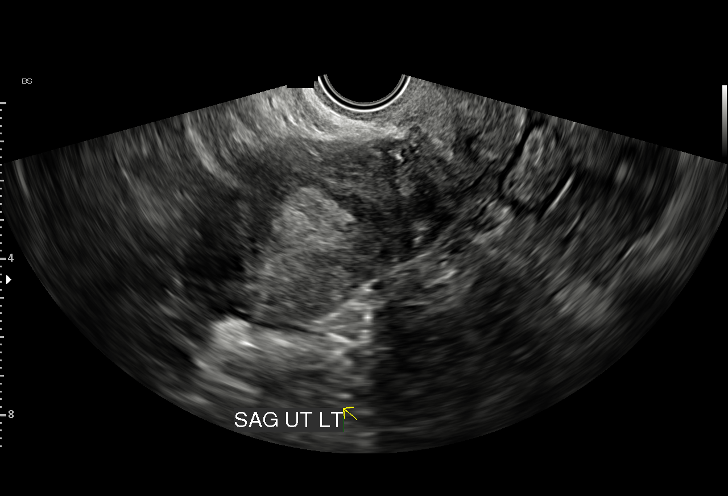
[im 11/29]
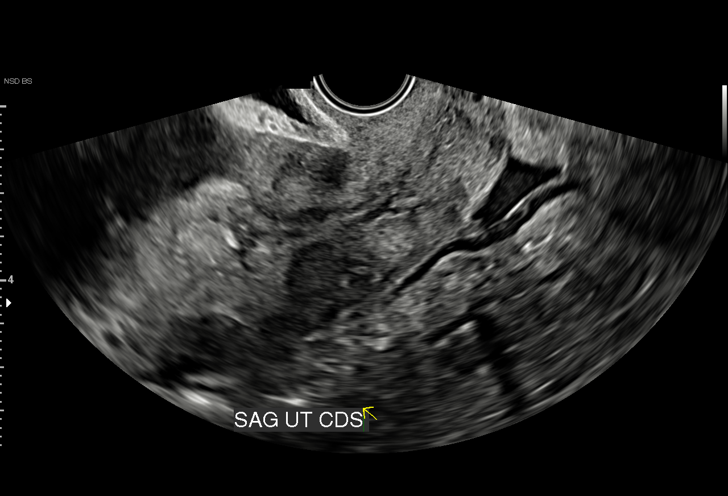
[im 13/29]
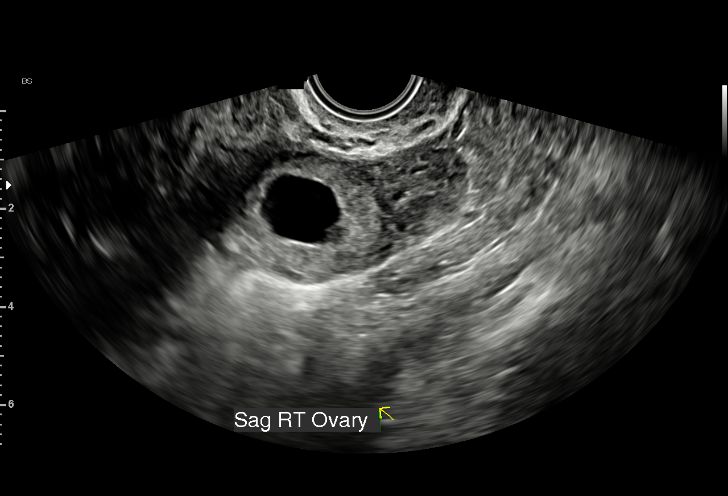
[im 15/29]
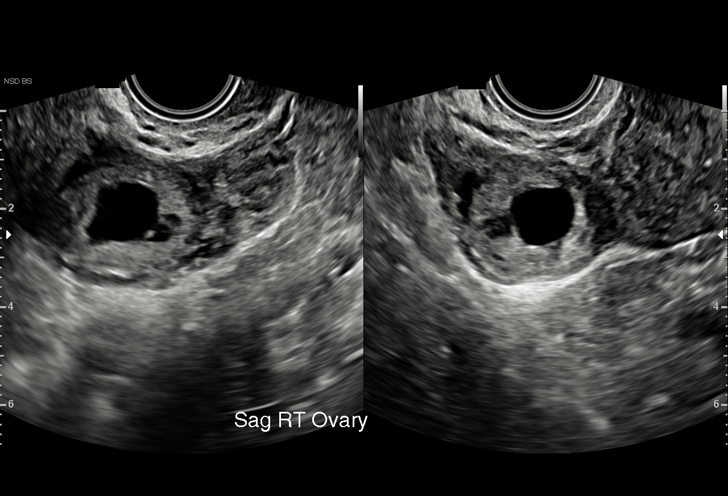
[im 16/29]
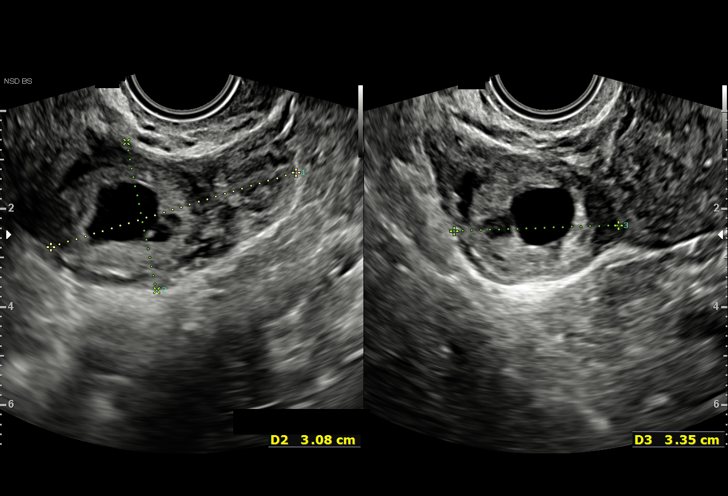
[im 18/29]
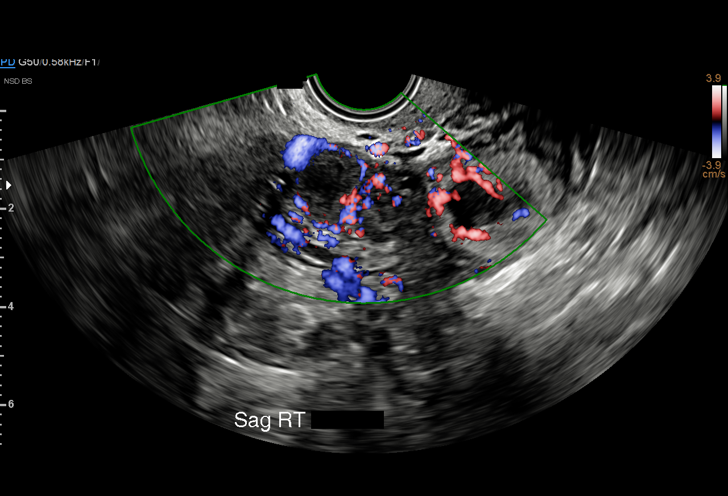
[im 20/29]
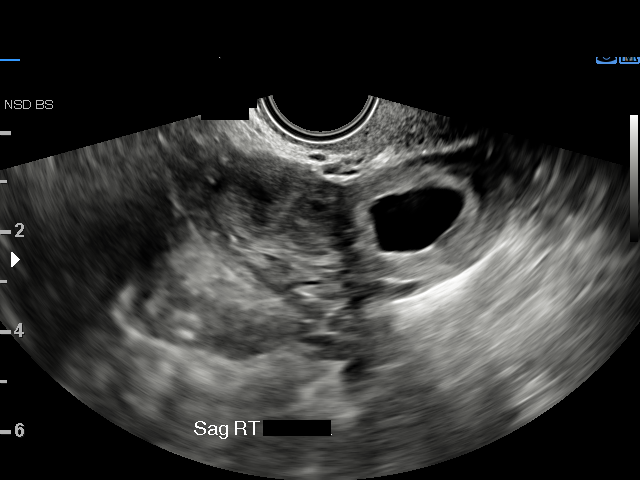
[im 22/29]
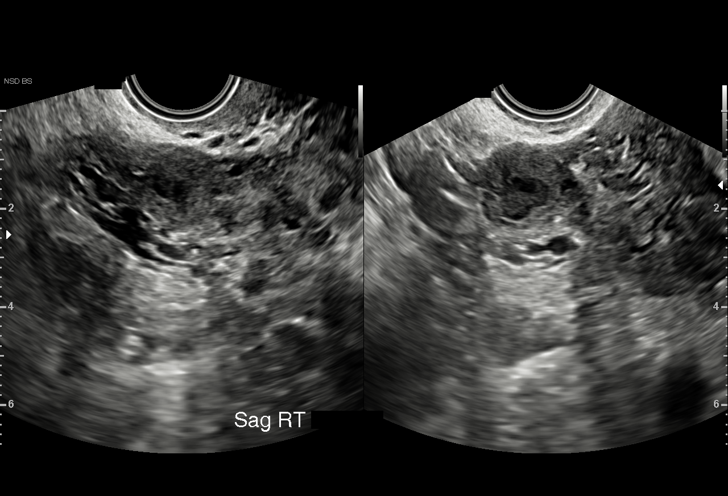
[im 24/29]
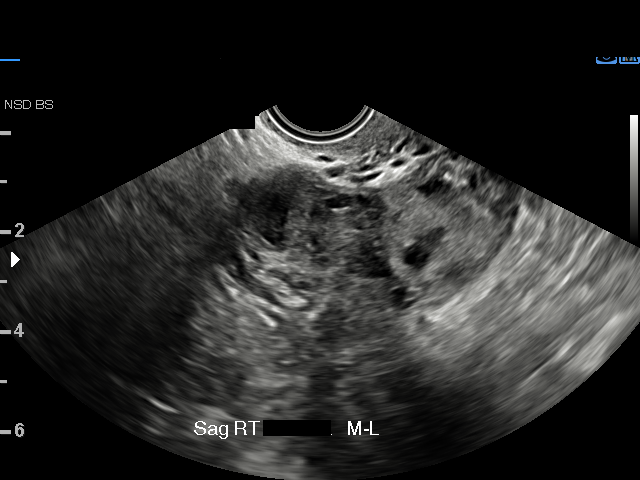
[im 26/29]
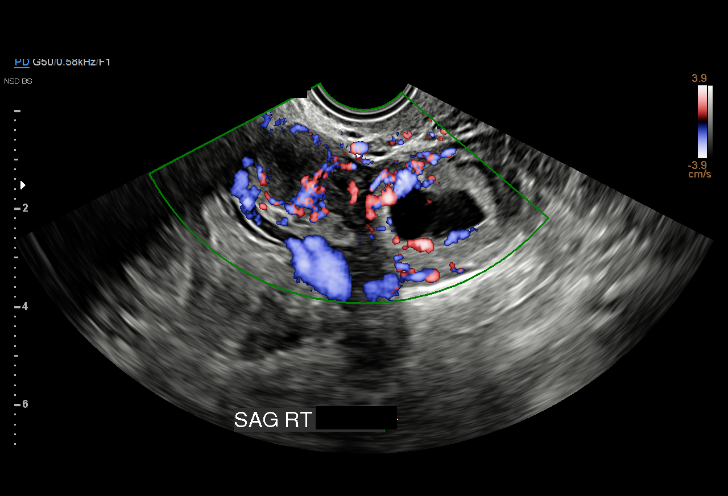
[im 29/29]
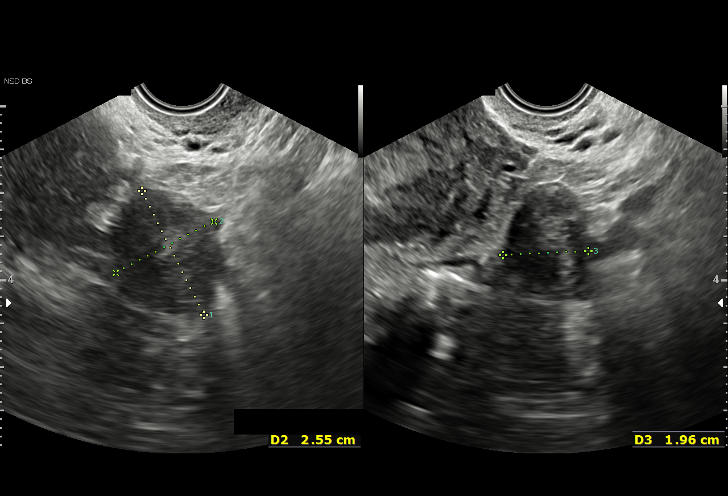

[15 of 28 positions shown; findings below may reference images not displayed]

FINDINGS: Intrauterine gestational sac: Absent.

Maternal uterus/adnexae: Prominent endometrium is noted consistent
with the positive pregnancy test although the previously seen
gestational sac is not present. There is however a new cystic lesion
within the right ovary which was not appreciated on the prior exam
with significant surrounding increased vascularity highly suggestive
of an ectopic pregnancy. This would correspond with the patient's
given clinical history of recent ectopic pregnancy and methotrexate
administration. No definitive fetal pole or yolk sac is noted at
this time.

Mild free fluid is noted in the pelvis adjacent to the right ovary
although does not appear complex in nature. Uterine fibroids are
again noted measuring up to 3.3 cm.
IMPRESSION: No evidence of intrauterine gestation.

There are changes consistent with an ectopic pregnancy in the right
adnexa/ovary which are consistent with the patient's given clinical
history. No fetal pole or yolk sac is noted at this time.

Mild free fluid.

Uterine fibroid stable from prior exams.

Critical Value/emergent results were called by telephone at the time
of interpretation on 01/02/2021 at [DATE] to NATALIYA HANNAN, CNM ,
who verbally acknowledged these results.

## 2022-07-15 IMAGING — CT CT ABD-PELV W/ CM
2 of 4 series · 15 of 46 positions shown, 17 images · IV contrast (APPLIED)
Comparison: Pelvic sonogram January 02, 2021.

CLINICAL DATA: Abdominal pain and fever in a postoperative patient,
surgery for ectopic on [DATE].

EXAM:
CT ABDOMEN AND PELVIS WITH CONTRAST
TECHNIQUE: Multidetector CT imaging of the abdomen and pelvis was performed
using the standard protocol following bolus administration of
intravenous contrast.
CONTRAST:  100mL OMNIPAQUE IOHEXOL 300 MG/ML  SOLN

[Series 3: abdomen 5.0 · axial · 0.90mm/px · z∈[-626,-176]mm · 12 of 102 slices shown, 14 images]
[im 6/102  soft-tissue]
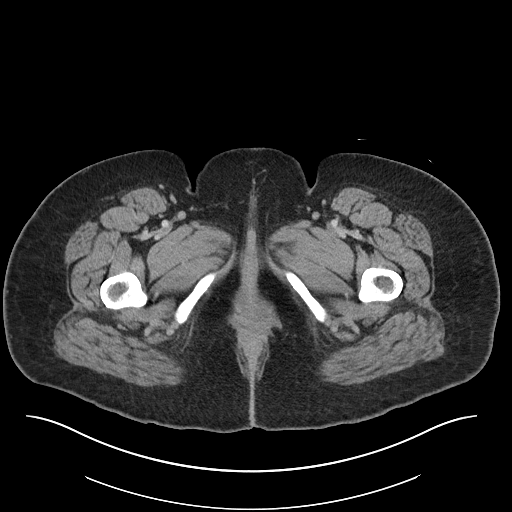
[im 6/102  bone]
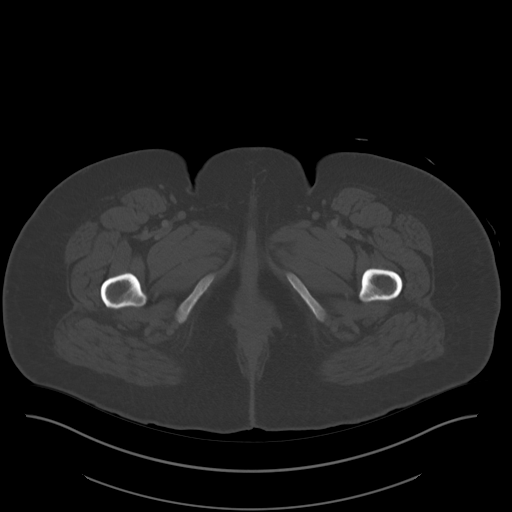
[im 17/102  soft-tissue]
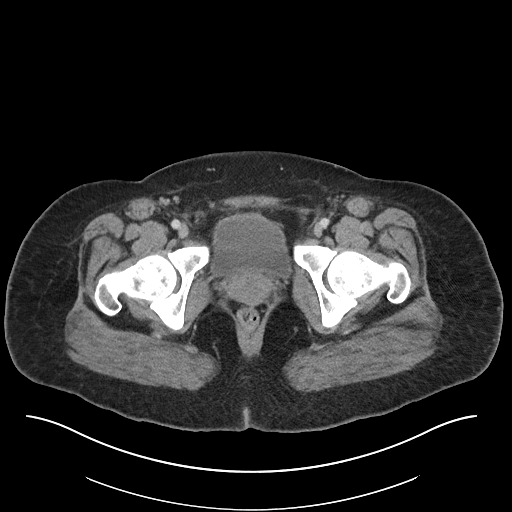
[im 23/102  soft-tissue]
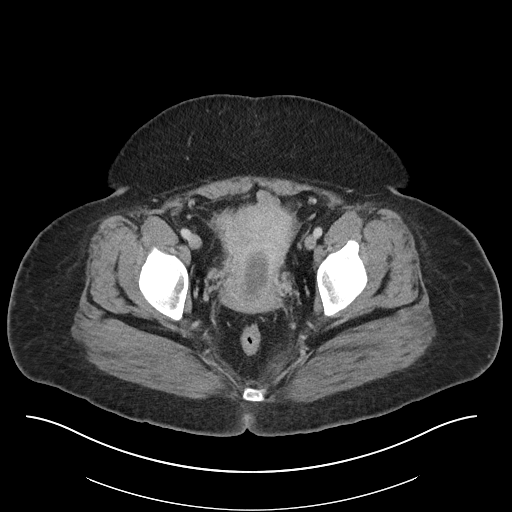
[im 29/102  soft-tissue]
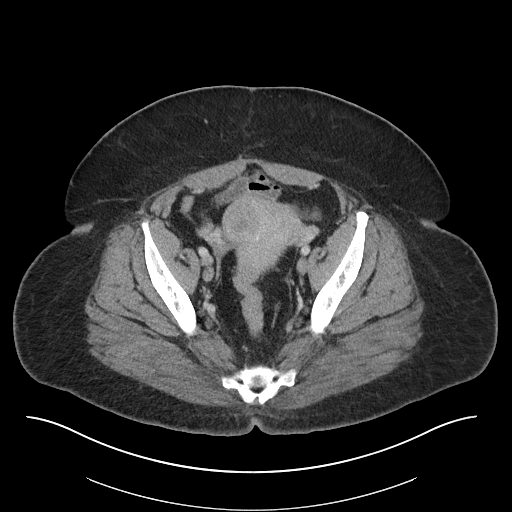
[im 40/102  soft-tissue]
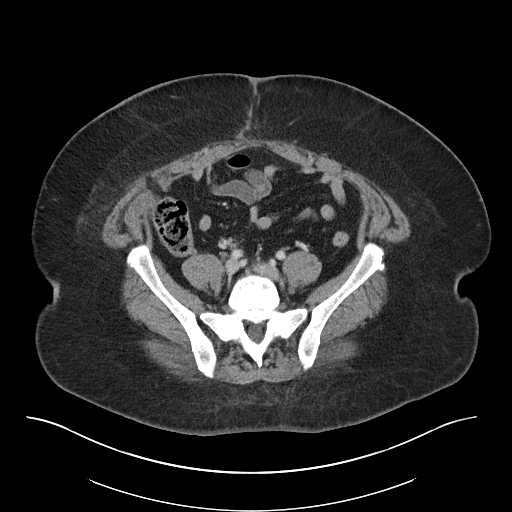
[im 45/102  soft-tissue]
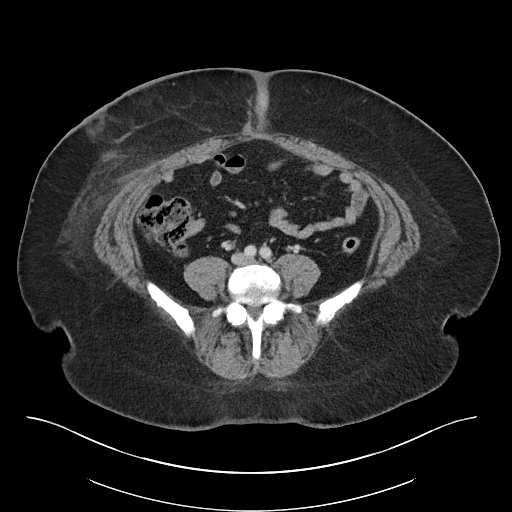
[im 57/102  soft-tissue]
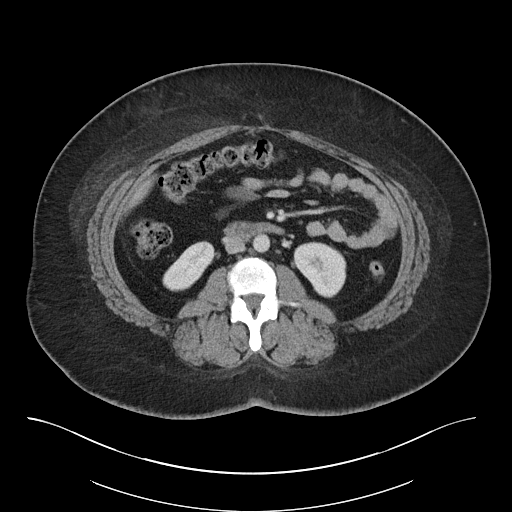
[im 62/102  soft-tissue]
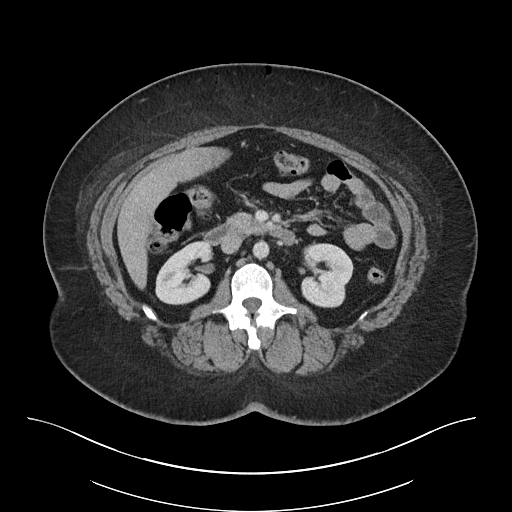
[im 73/102  soft-tissue]
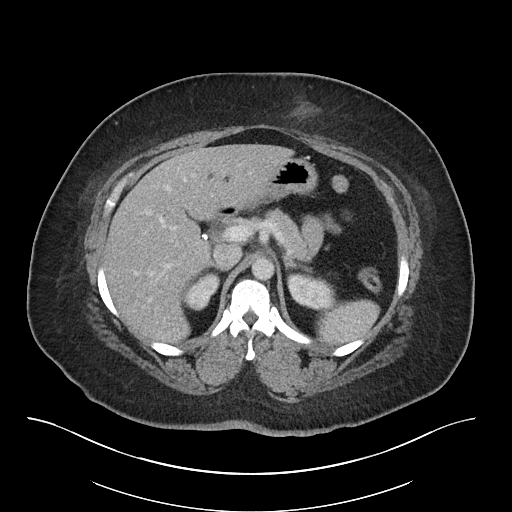
[im 73/102  bone]
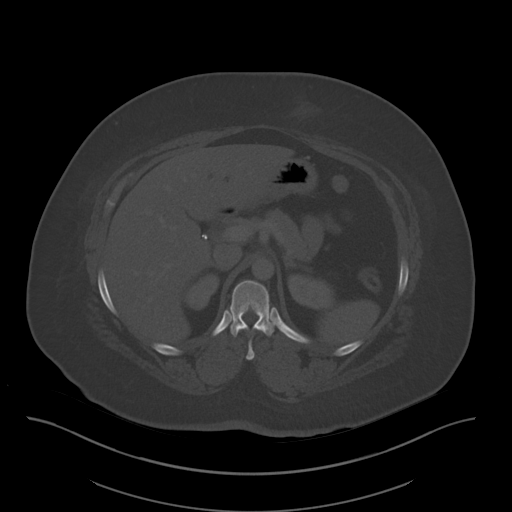
[im 79/102  soft-tissue]
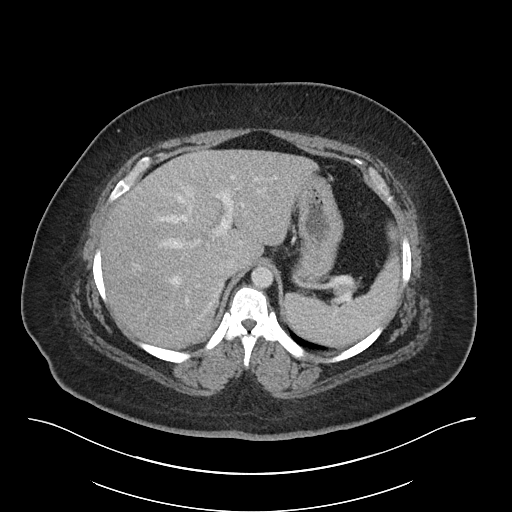
[im 85/102  soft-tissue]
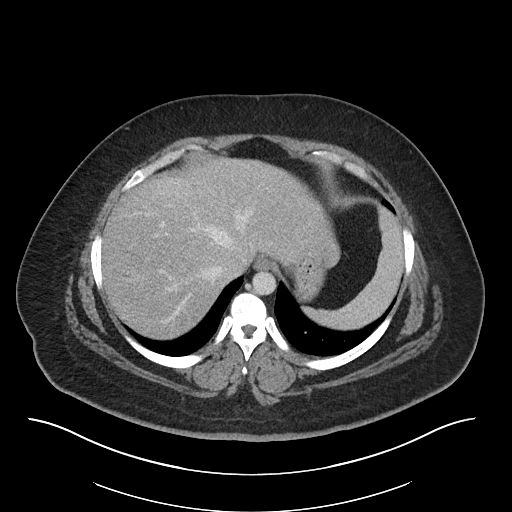
[im 96/102  soft-tissue]
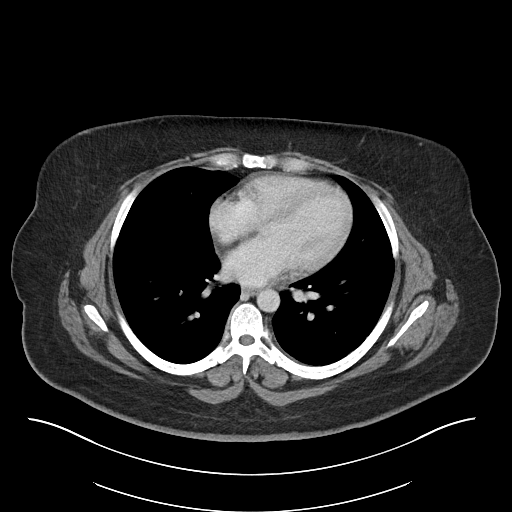

[Series 6: abdomen 3.0 mpr cor · coronal · 0.80mm/px · 3 of 115 slices shown]
[im 39/115  soft-tissue]
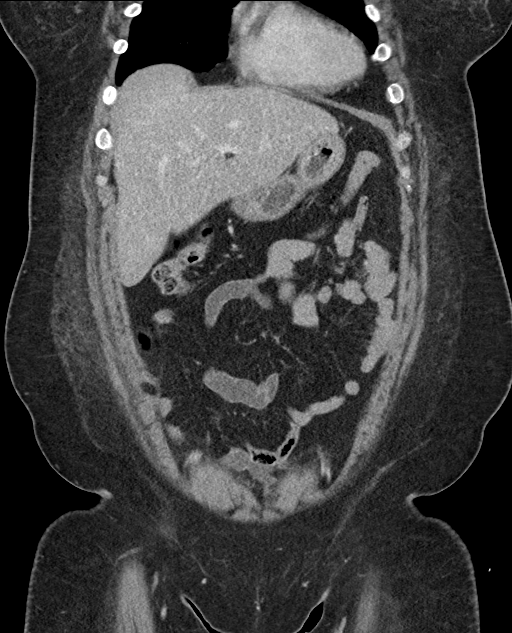
[im 51/115  soft-tissue]
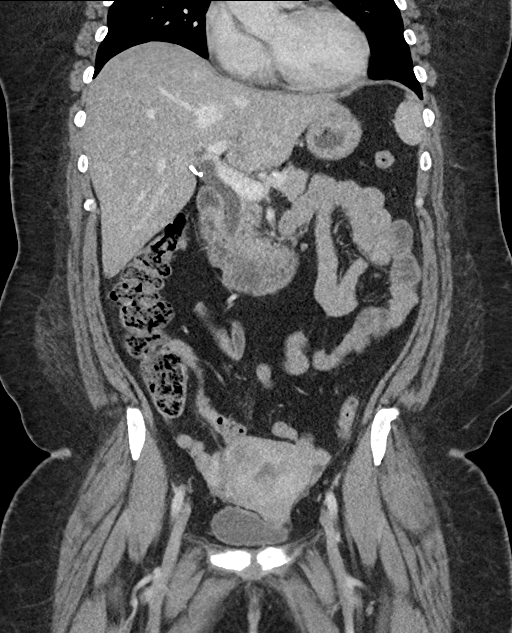
[im 64/115  soft-tissue]
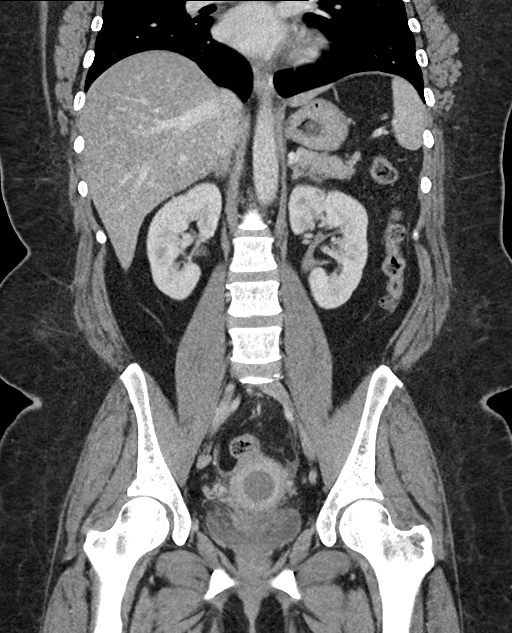

[15 of 46 positions shown; findings below may reference images not displayed]

FINDINGS: Lower chest: Lung bases with basilar atelectasis.

Hepatobiliary: Hepatic steatosis with focal fat intensification
along the fissure for false form ligament. Portal vein is patent.
Hepatic veins are patent.

Post cholecystectomy without substantial biliary duct dilation.

Pancreas: Normal, without mass, inflammation or ductal dilatation.

Spleen: Spleen top normal.  No focal lesion.

Adrenals/Urinary Tract: Adrenal glands are normal. Symmetric renal
enhancement. No hydronephrosis.

Urinary bladder is under distended.

Stomach/Bowel: No acute gastrointestinal process. The appendix is
normal. Stool in the proximal colon without colonic distension.

Vascular/Lymphatic: Normal caliber of the abdominal aorta. Smooth
contour of the IVC. There is no gastrohepatic or hepatoduodenal
ligament lymphadenopathy. No retroperitoneal or mesenteric
lymphadenopathy.

Scattered pelvic lymph nodes none with pathologic enlargement.

Reproductive: Endometrial thickening and leiomyomata in the uterus.
No adnexal mass. Trace amount of fluid in stranding about the RIGHT
adnexa following salpingectomy for ectopic pregnancy.

Other: Stranding in the body wall particularly along the RIGHT
hemiabdomen in the subcutaneous fat. Tract of mixed density
predominantly fluid extends from what is presumed to be a port site
for laparoscopic procedure. This shows no peripheral enhancement
measuring approximately 4.2 x 2.3 cm greatest dimension in terms of
fluid with stranding more extensively along the RIGHT flank. Also a
defect in RIGHT lower quadrant abdominal wall musculature on image
63 of series [DATE] be contiguous with this fluid and this area may
represent the actual site of port insertion.

Mild stranding about the umbilicus as well.

Scattered locules of gas, small amounts along the anterior surface
of the abdomen adjacent to another potential site for laparoscopic
access in the LEFT upper quadrant. Some signs of skin thickening or
noted over the RIGHT lower quadrant as well overlying the
subcutaneous fat stranding.

Musculoskeletal: As above with muscular defect in the RIGHT lower
quadrant. Edema in adjacent musculature as well.
IMPRESSION: 1. Defect in abdominal wall musculature in the RIGHT lower quadrant
with tract of fluid extending towards the skin surface, overlying
skin thickening and subcutaneous stranding raising the question of
wound infection and extensive cellulitis following recent
laparoscopic surgery.
2. Defect in musculature just lateral to the Ellen Uchiha, this could
be a potential risk for development of hernia though may have
fascial closure overlying the area. Suggest attention on follow-up.
3. Scattered locules of gas in the upper abdomen may relate to
recent laparoscopic access. Areas about the umbilicus and in the
LEFT upper quadrant are more typical for laparoscopic changes.
4. Endometrial thickening and leiomyomata in the uterus, small free
fluid in the pelvis without intra-abdominal or pelvic abscess.
5. Hepatic steatosis.
6. Post cholecystectomy without substantial biliary duct dilation.
